# Patient Record
Sex: Female | Born: 1970 | Race: White | Hispanic: No | Marital: Married | Smoking: Former smoker
Health system: Southern US, Academic
[De-identification: ages and names within clinical notes are randomized; demographics above are authoritative.]

## PROBLEM LIST (undated history)

## (undated) DIAGNOSIS — F909 Attention-deficit hyperactivity disorder, unspecified type: Secondary | ICD-10-CM

## (undated) DIAGNOSIS — B009 Herpesviral infection, unspecified: Secondary | ICD-10-CM

## (undated) DIAGNOSIS — E039 Hypothyroidism, unspecified: Secondary | ICD-10-CM

## (undated) DIAGNOSIS — F32A Depression, unspecified: Secondary | ICD-10-CM

## (undated) DIAGNOSIS — C4491 Basal cell carcinoma of skin, unspecified: Secondary | ICD-10-CM

## (undated) DIAGNOSIS — F329 Major depressive disorder, single episode, unspecified: Secondary | ICD-10-CM

## (undated) DIAGNOSIS — F419 Anxiety disorder, unspecified: Secondary | ICD-10-CM

## (undated) HISTORY — PX: HX TONSILLECTOMY: SHX27

## (undated) HISTORY — DX: Anxiety disorder, unspecified: F41.9

## (undated) HISTORY — PX: TONSILLECTOMY: SUR1361

## (undated) HISTORY — DX: Major depressive disorder, single episode, unspecified: F32.9

## (undated) HISTORY — DX: Basal cell carcinoma of skin, unspecified: C44.91

## (undated) HISTORY — PX: MOHS SURGERY: SUR867

## (undated) HISTORY — DX: Depression, unspecified: F32.A

## (undated) HISTORY — DX: Attention-deficit hyperactivity disorder, unspecified type: F90.9

---

## 2011-01-07 ENCOUNTER — Ambulatory Visit (INDEPENDENT_AMBULATORY_CARE_PROVIDER_SITE_OTHER): Payer: Self-pay | Admitting: Internal Medicine

## 2011-01-21 ENCOUNTER — Ambulatory Visit: Payer: No Typology Code available for payment source | Attending: Internal Medicine | Admitting: Internal Medicine

## 2011-01-21 ENCOUNTER — Encounter (INDEPENDENT_AMBULATORY_CARE_PROVIDER_SITE_OTHER): Payer: Self-pay | Admitting: Internal Medicine

## 2011-01-21 DIAGNOSIS — F341 Dysthymic disorder: Secondary | ICD-10-CM | POA: Insufficient documentation

## 2011-01-21 DIAGNOSIS — Z0000 Encounter for general adult medical examination without abnormal findings: Secondary | ICD-10-CM | POA: Insufficient documentation
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
## 2011-01-22 ENCOUNTER — Telehealth (INDEPENDENT_AMBULATORY_CARE_PROVIDER_SITE_OTHER): Payer: Self-pay | Admitting: Internal Medicine

## 2011-01-22 MED ORDER — DULOXETINE 30 MG CAPSULE,DELAYED RELEASE
30.0000 mg | DELAYED_RELEASE_CAPSULE | Freq: Every day | ORAL | Status: DC
Start: 2011-01-22 — End: 2011-02-03

## 2011-01-22 NOTE — Telephone Encounter (Signed)
Message copied by Charisse Klinefelter on Thu Jan 22, 2011 11:53 AM  ------       Message from: Peter Garter       Created: Wed Jan 21, 2011  1:02 PM         Pt is sched with psych on 6.18.12. Pt will need refills on her meds. She also states that she needs clonapen.              Please contact pt at [phone]              Thanks       Red - RS

## 2011-02-03 ENCOUNTER — Encounter: Payer: Self-pay | Admitting: Internal Medicine

## 2011-02-03 ENCOUNTER — Other Ambulatory Visit (INDEPENDENT_AMBULATORY_CARE_PROVIDER_SITE_OTHER): Payer: Self-pay | Admitting: Internal Medicine

## 2011-02-03 MED ORDER — ZOLPIDEM 10 MG TABLET
10.0000 mg | ORAL_TABLET | Freq: Every evening | ORAL | Status: DC | PRN
Start: 2011-02-03 — End: 2011-06-30

## 2011-02-03 MED ORDER — KLONOPIN ORAL
1.0000 mg | Freq: Every day | ORAL | Status: DC | PRN
Start: 2011-02-03 — End: 2011-04-28

## 2011-02-03 MED ORDER — DULOXETINE 30 MG CAPSULE,DELAYED RELEASE
30.0000 mg | DELAYED_RELEASE_CAPSULE | Freq: Every day | ORAL | Status: DC
Start: 2011-02-03 — End: 2011-05-26

## 2011-02-03 NOTE — Telephone Encounter (Signed)
Message copied by Richrd Prime on Tue Feb 03, 2011  1:44 PM  ------       Message from: Venia Carbon       Created: Tue Feb 03, 2011 12:08 PM         >> Venia Carbon 02/03/2011 12:08 PM       Dr Arizona Constable does not accept pt's insurance. Please send rxs to:       [facility] - [place], [facility] [address]              Phone: [phone] Fax: [phone]       duloxetine (CYMBALTA DR) 30 mg Oral Capsule, Delayed   #30 WITH REFILLS       Zolpidem (AMBIEN) 10 mg Oral Tablet  #30       CLONAZEPAM (KLONOPIN ORAL) 1 MG

## 2011-02-04 NOTE — Telephone Encounter (Addendum)
Ambien and Klonopin phoned into pharmacy as directed.

## 2011-03-04 ENCOUNTER — Encounter (INDEPENDENT_AMBULATORY_CARE_PROVIDER_SITE_OTHER): Payer: No Typology Code available for payment source | Admitting: Internal Medicine

## 2011-03-11 ENCOUNTER — Ambulatory Visit (INDEPENDENT_AMBULATORY_CARE_PROVIDER_SITE_OTHER): Payer: No Typology Code available for payment source | Admitting: Internal Medicine

## 2011-03-16 ENCOUNTER — Ambulatory Visit (INDEPENDENT_AMBULATORY_CARE_PROVIDER_SITE_OTHER): Payer: Self-pay | Admitting: Internal Medicine

## 2011-03-16 NOTE — Telephone Encounter (Signed)
Message copied by Richrd Prime on Mon Mar 16, 2011  2:16 PM  ------       Message from: Gibson Ramp       Created: Mon Mar 16, 2011 11:20 AM         >> Gibson Ramp 03/16/2011 11:20 AM       Dr.Brahmamdam       The pt is requesting that you put an order in the system for labs. She also scheduled an appt to see you in July, it is a follow up from her appt with the psychiatrist. She wants to know if you need to see her after the appt? If not, she will cancel. Please let her know

## 2011-03-26 ENCOUNTER — Ambulatory Visit (HOSPITAL_COMMUNITY): Payer: No Typology Code available for payment source

## 2011-04-13 ENCOUNTER — Ambulatory Visit (HOSPITAL_COMMUNITY): Payer: No Typology Code available for payment source | Admitting: Psychiatry

## 2011-04-24 ENCOUNTER — Other Ambulatory Visit (INDEPENDENT_AMBULATORY_CARE_PROVIDER_SITE_OTHER): Payer: Self-pay | Admitting: Internal Medicine
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
## 2011-04-27 NOTE — Telephone Encounter (Signed)
i reviewed the chart. i have seen this patient once and in my recollection, and with her phone message, i am nervous to be calling in clonazepam. i did attempt to call her just now and i left a message for her. Cell phone and home phone are the same. i asked her to call back giving her our office number and told her we need to speak with her to make sure she is ok and we can help her in the right way.  Charisse Klinefelter, MD 04/27/2011, 3:18 PM

## 2011-04-28 ENCOUNTER — Telehealth (HOSPITAL_COMMUNITY): Payer: Self-pay

## 2011-04-28 ENCOUNTER — Encounter (INDEPENDENT_AMBULATORY_CARE_PROVIDER_SITE_OTHER): Payer: Self-pay | Admitting: Internal Medicine

## 2011-04-28 ENCOUNTER — Ambulatory Visit: Payer: No Typology Code available for payment source | Attending: Internal Medicine | Admitting: Internal Medicine

## 2011-04-28 DIAGNOSIS — F3289 Other specified depressive episodes: Secondary | ICD-10-CM | POA: Insufficient documentation

## 2011-04-28 DIAGNOSIS — F411 Generalized anxiety disorder: Secondary | ICD-10-CM | POA: Insufficient documentation

## 2011-04-28 MED ORDER — CLONAZEPAM 1 MG TABLET
1.00 mg | ORAL_TABLET | Freq: Two times a day (BID) | ORAL | Status: DC | PRN
Start: 2011-04-28 — End: 2011-06-30

## 2011-04-28 NOTE — Telephone Encounter (Signed)
Dr. Silvana Newness [facility] messaged Dr. Annie Paras stating this patient needed an psychiatric intake appointment ASAP.  Dr. Everardo Beals messaged case manager and forwarded message from Dr. Isabell Jarvis.  Case manager called patient and told her the earliest appointment available was 07/29/11 but that her name could be placed on a cancellation list and she would be called as soon as someone cancelled.  Patient did not wish to make the October appointment but asked to be placed on the cancellation list.  Case manager shared this information in a message to both Dr. Everardo Beals and Dr. Isabell Jarvis.  04/28/2011  Lindsay Henson Karin Golden Lekha Dancer, DISCHARGE PL
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
## 2011-05-05 ENCOUNTER — Ambulatory Visit (HOSPITAL_COMMUNITY): Payer: Self-pay

## 2011-05-05 NOTE — Telephone Encounter (Signed)
Patient is having surgery and cannot make appointment in adult intake clinic for 05/06/11, she asked to be put back on the cancellation list and called after two weeks for recovery.  Case manager put patient back on cancellation list with note not to contact patient until after 05/22/11.  05/05/2011  Lindsay Henson, DISCHARGE PL

## 2011-05-05 NOTE — Telephone Encounter (Signed)
Message copied by Creta Levin on Tue May 05, 2011 12:02 PM  ------       Message from: Barkley Bruns       Created: Tue May 05, 2011 11:42 AM         >> Barkley Bruns 05/05/2011 11:42 AM       Patient was worked in from a Chiropodist for 7.11.12 but had to cancel because she's having surgery that cannot be postponed. She will be incapacitated for 2 weeks; can she be put back on the cancellation list for after that time?

## 2011-05-06 ENCOUNTER — Ambulatory Visit (HOSPITAL_COMMUNITY): Payer: No Typology Code available for payment source

## 2011-05-26 ENCOUNTER — Ambulatory Visit (INDEPENDENT_AMBULATORY_CARE_PROVIDER_SITE_OTHER): Payer: No Typology Code available for payment source | Admitting: Child & Adolescent Psychiatry

## 2011-05-26 ENCOUNTER — Encounter (FREE_STANDING_LABORATORY_FACILITY)
Admit: 2011-05-26 | Discharge: 2011-05-26 | Disposition: A | Discharge status: Home / Self Care | Payer: No Typology Code available for payment source | Attending: Child & Adolescent Psychiatry | Admitting: Child & Adolescent Psychiatry

## 2011-05-26 DIAGNOSIS — F411 Generalized anxiety disorder: Secondary | ICD-10-CM | POA: Diagnosis present

## 2011-05-26 LAB — THYROXINE, TOTAL T4: THYROXINE, TOTAL T4: 6 ug/dL (ref 5.0–12.5)

## 2011-05-26 LAB — THYROID STIMULATING HORMONE WITH FREE T4 REFLEX: THYROID STIMULATING HORMONE WITH FREE T4 REFLEX: 2.077 u[IU]/mL (ref 0.300–5.900)

## 2011-05-26 LAB — VITAMIN B12: VITAMIN B12: 505 pg/mL (ref 180–914)

## 2011-05-26 LAB — TRIIODOTHYRONINE, TOTAL (TOTAL T3): TRIIODOTHYRONINE, TOTAL (TOTAL T3): 114 ng/dL (ref 80–190)

## 2011-05-26 MED ORDER — SERTRALINE 50 MG TABLET
ORAL_TABLET | ORAL | Status: AC
Start: 2011-05-26 — End: ?

## 2011-05-26 MED ORDER — DULOXETINE 20 MG CAPSULE,DELAYED RELEASE
20.0000 mg | DELAYED_RELEASE_CAPSULE | Freq: Every day | ORAL | Status: DC
Start: 2011-05-26 — End: 2011-06-08
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
## 2011-06-30 ENCOUNTER — Ambulatory Visit: Payer: No Typology Code available for payment source | Attending: Internal Medicine | Admitting: Internal Medicine

## 2011-06-30 DIAGNOSIS — M542 Cervicalgia: Secondary | ICD-10-CM | POA: Insufficient documentation

## 2011-06-30 DIAGNOSIS — R209 Unspecified disturbances of skin sensation: Secondary | ICD-10-CM | POA: Insufficient documentation
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
## 2011-07-02 ENCOUNTER — Encounter (HOSPITAL_COMMUNITY): Payer: No Typology Code available for payment source | Admitting: Child & Adolescent Psychiatry

## 2011-08-10 ENCOUNTER — Other Ambulatory Visit (INDEPENDENT_AMBULATORY_CARE_PROVIDER_SITE_OTHER): Payer: Self-pay | Admitting: Internal Medicine

## 2011-08-10 NOTE — Telephone Encounter (Signed)
[facility] faxed refill for Ambien 10 mg.  at bedtime.  This medication is not on the med. List.

## 2012-09-08 ENCOUNTER — Ambulatory Visit (INDEPENDENT_AMBULATORY_CARE_PROVIDER_SITE_OTHER): Payer: No Typology Code available for payment source | Admitting: PREVENTIVE MEDICINE-OCCUPATIONAL MEDICINE

## 2012-09-08 NOTE — Patient Instructions (Signed)
Vaccine Information Statement    Influenza (Flu) Vaccine (Inactivated): What you need to know  2013-14    Many Vaccine Information Statements are available in Spanish and other languages. See www.immunize.org/vis  Hojas de Informacián Sobre Vacunas están disponibles en Español y en muchos otros idiomas. Visite http://www.immunize.org/vis    1. Why get vaccinated?    Influenza (“flu”) is a contagious disease that spreads around the [place] every winter, usually between October and May.     Flu is caused by the influenza virus, and can be spread by coughing, sneezing, and close contact.     Anyone can get flu, but the risk of getting flu is highest among children. Symptoms come on suddenly and may last several days. They can include:  • fever/chills  • sore throat  • muscle aches  • fatigue  • cough  • headache   • runny or stuffy nose    Flu can make some people much sicker than others. These people include young children, people 65 and older, pregnant women, and people with certain health conditions - such as heart, lung or kidney disease, or a weakened immune system.  Flu vaccine is especially important for these people, and anyone in close contact with them.    Flu can also lead to pneumonia, and make existing medical conditions worse. It can cause diarrhea and seizures in children.     Each year thousands of people in the [place] die from flu, and many more are hospitalized.     Flu vaccine is the best protection we have from flu and its complications. Flu vaccine also helps prevent spreading flu from person to person.       2. Inactivated flu vaccine    There are two types of influenza vaccine:     You are getting an inactivated flu vaccine, which does not contain any live influenza virus. It is given by injection with a needle, and often called the “flu shot.”    A different, live, attenuated (weakened) influenza vaccine is sprayed into the nostrils. This vaccine is described in a separate Vaccine  Information Statement.    Flu vaccine is recommended every year. Children 6 months through 8 years of age should get two doses the first year they get vaccinated.    Flu viruses are always changing. Each year’s flu vaccine is made to protect from viruses that are most likely to cause disease that year. While flu vaccine cannot prevent all cases of flu, it is our best defense against the disease. Inactivated flu vaccine protects against 3 or 4 different influenza viruses.    It takes about 2 weeks for protection to develop after the vaccination, and protection lasts several months to a year.     Some illnesses that are not caused by influenza virus are often mistaken for flu. Flu vaccine will not prevent these illnesses. It can only prevent influenza.    A “high-dose” flu vaccine is available for people 65 years of age and older. The person giving you the vaccine can tell you more about it.    Some inactivated flu vaccine contains a very small amount of a mercury-based preservative called thimerosal. Studies have shown that thimerosal in vaccines is not harmful, but flu vaccines that do not contain a preservative are available.      3. Some people should not get this vaccine    Tell the person who gives you the vaccine:  • If you have any severe (  life-threatening) allergies.  If you ever had a life-threatening allergic reaction after a dose of flu vaccine, or have a severe allergy to any part of this vaccine, you may be advised not to get a dose.  Most, but not all, types of flu vaccine contain a small amount of egg.     • If you ever had Guillain-Barré Syndrome (a severe paralyzing illness, also called GBS). Some people with a history of GBS should not get this vaccine. This should be discussed with your doctor.  • If you are not feeling well.  They might suggest waiting until you feel better.  But you should come back.      4. Risks of a vaccine reaction    With a vaccine, like any medicine, there is a chance of  side effects. These are usually mild and go away on their own.     Serious side effects are also possible, but are very rare. Inactivated flu vaccine does not contain live flu virus, so getting flu from this vaccine is not possible.     Brief fainting spells and related symptoms (such as jerking movements) can happen after any medical procedure, including vaccination. Sitting or lying down for about 15 minutes after a vaccination can help prevent fainting and injuries caused by falls. Tell your doctor if you feel dizzy or light-headed, or have vision changes or ringing in the ears.    Mild problems following inactivated flu vaccine:   • soreness, redness, or swelling where the shot was given    • hoarseness; sore, red or itchy eyes; cough  • fever  • aches  • headache  • itching  • fatigue  If these problems occur, they usually begin soon after the shot and last 1 or 2 days.     Moderate problems following inactivated flu vaccine:  • Young children who get inactivated flu vaccine and pneumococcal vaccine (PCV13) at the same time may be at increased risk for seizures caused by fever. Ask your doctor for more information. Tell your doctor if a child who is getting flu vaccine has ever had a seizure.    Severe problems following inactivated flu vaccine:  • A severe allergic reaction could occur after any vaccine (estimated less than 1 in a million doses).   • There is a small possibility that inactivated flu vaccine could be associated with Guillain-Barré Syndrome (GBS), no more than 1 or 2 cases per million people vaccinated. This is much lower than the risk of severe complications from flu, which can be prevented by flu vaccine.     The safety of vaccines is always being monitored.  For more information, visit: www.cdc.gov/vaccinesafety/       5. What if there is a serious reaction?    What should I look for?    • Look for anything that concerns you, such as signs of a severe allergic reaction, very high fever, or  behavior changes.    Signs of a severe allergic reaction can include hives, swelling of the face and throat, difficulty breathing, a fast heartbeat, dizziness, and weakness. These would start a few minutes to a few hours after the vaccination.    What should I do?    • If you think it is a severe allergic reaction or other emergency that can’t wait, call 9-1-1 or get the person to the nearest hospital. Otherwise, call your doctor.  • Afterward, the reaction should be reported to the Vaccine Adverse Event   Reporting System (VAERS). Your doctor might file this report, or you can do it yourself through the VAERS web site at www.vaers.hhs.gov, or by calling 1-800-822-7967.    VAERS is only for reporting reactions. They do not give medical advice.      7. The National Vaccine Injury Compensation Program    The National Vaccine Injury Compensation Program (VICP) is a federal program that was created to compensate people who may have been injured by certain vaccines.    Persons who believe they may have been injured by a vaccine can learn about the program and about filing a claim by calling 1-800-338-2382 or visiting the VICP website at www.hrsa.gov/vaccinecompensation.      8. How can I learn more?  • Ask your doctor.  • Call your local or state health department.  • Contact the Centers for Disease Control and   Prevention (CDC):  - Call 1-800-232-4636 (1-800-CDC-INFO) or  - Visit CDC’s website at www.cdc.gov/flu      Vaccine Information Statement (Interim)  Inactivated Influenza Vaccine   (05/20/2012)   42 U.S.C. § 300aa-26    Department of Health and Human Services  Centers for Disease Control and Prevention    Office Use Only

## 2012-09-08 NOTE — Progress Notes (Signed)
Influenza vaccine given.  Lindsay Henson 09/08/2012, 1:37 PM

## 2013-06-30 ENCOUNTER — Ambulatory Visit (HOSPITAL_COMMUNITY): Payer: Self-pay

## 2013-06-30 NOTE — Telephone Encounter (Signed)
Patient has Epic orders for an intake appointment at [facility]. There are currently no intake appointments available due to current patient capacity. Case manager put patient on cancellation list and sent patient a letter explaining wait times for a new patient appointment at [facility].   06/30/2013

## 2013-06-30 NOTE — Telephone Encounter (Signed)
Message copied by Delorise Hunkele, [place] L on Fri Jun 30, 2013  4:17 PM  ------       Message from: Clemencia Course       Created: Thu Jun 08, 2013  1:42 PM         >> Clemencia Course 06/08/2013 01:42 PM       Rebekah/new Pt Inquiry--pt last seen [id number] Dr Nee--Dr Marnette Burgess is referring this pt who is currently pregnant and experiencing issues wth anxiety and depression. Pt had previously been taking Clonopin, Ambien, Adderall and Zoloft 200mg . Pt is now only taking the Zoloft at 10 mg and no other medications with worsening of her symptoms. They ask for her ot be seen ASAP but I advised of our current scheduling status. Caller will make referring provider aware of this and will cal [place] back if they decide to make an alternate referral. We can contact pt directly for scheduling at [phone] (ph confirmed) thanks!  ------

## 2013-08-20 ENCOUNTER — Other Ambulatory Visit: Payer: Self-pay

## 2013-10-10 ENCOUNTER — Ambulatory Visit (INDEPENDENT_AMBULATORY_CARE_PROVIDER_SITE_OTHER): Payer: Self-pay

## 2013-10-10 NOTE — Telephone Encounter (Signed)
Left message for pt to return call to clinic.

## 2013-10-10 NOTE — Telephone Encounter (Signed)
Message copied by Zada Finders on Tue Oct 10, 2013  1:48 PM  ------       Message from: RICE, PATRICIA       Created: Mon Oct 09, 2013  1:45 PM       Regarding: placenta removal from hospital         >> PATRICIA RICE 10/09/2013 01:45 PM       Placenta removal from [facility] called and would like to talk to someone about delivering her baby here in January and then having a service provider come in a remove her placenta from the hospital after delivery of the baby.  Please advise her at [phone]. Thank you  ------

## 2013-10-17 NOTE — Telephone Encounter (Signed)
Pt never returned call.

## 2014-06-06 ENCOUNTER — Telehealth (HOSPITAL_COMMUNITY): Payer: Self-pay

## 2014-06-06 NOTE — Telephone Encounter (Signed)
Patient has been on New Patient waiting list for psychiatry and there are now appointments available for scheduling.  Case manager called patient to offer an appointment but patient did not answer.  Case manager left message asking patient to call back if still interested in scheduling.  06/06/2014  Lindsay Henson Lorraine Gedalia Mcmillon, DISCHARGE [address]

## 2014-08-02 ENCOUNTER — Other Ambulatory Visit: Payer: Self-pay

## 2014-08-22 ENCOUNTER — Ambulatory Visit (INDEPENDENT_AMBULATORY_CARE_PROVIDER_SITE_OTHER): Payer: No Typology Code available for payment source

## 2014-08-22 VITALS — Temp 98.9°F

## 2014-08-22 DIAGNOSIS — Z23 Encounter for immunization: Secondary | ICD-10-CM

## 2014-08-22 NOTE — Patient Instructions (Signed)
2015-2016 Vaccine Information Statement    Influenza (Flu) Vaccine (Inactivated or Recombinant): What you need to know    Many Vaccine Information Statements are available in Spanish and other languages. See www.immunize.org/vis  Hojas de Información Sobre Vacunas están disponibles en Español y en muchos otros idiomas. Visite www.immunize.org/vis    1. Why get vaccinated?    Influenza ("flu") is a contagious disease that spreads around the [place] every year, usually between October and May.     Flu is caused by influenza viruses, and is spread mainly by coughing, sneezing, and close contact.     Anyone can get flu. Flu strikes suddenly and can last several days. Symptoms vary by age, but can include:  o fever/chills  o sore throat  o muscle aches  o fatigue  o cough  o headache   o runny or stuffy nose    Flu can also lead to pneumonia and blood infections, and cause diarrhea and seizures in children.  If you have a medical condition, such as heart or lung disease, flu can make it worse.    Flu is more dangerous for some people. Infants and young children, people 65 years of age and older, pregnant women, and people with certain health conditions or a weakened immune system are at greatest risk.      Each year thousands of people in the [place] die from flu, and many more are hospitalized.     Flu vaccine can:  o keep you from getting flu,  o make flu less severe if you do get it, and  o keep you from spreading flu to your family and other people.     2. Inactivated and recombinant flu vaccines    A dose of flu vaccine is recommended every flu season. Children 6 months through 8 years of age may need two doses during the same flu season.  Everyone else needs only one dose each flu season.       Some inactivated flu vaccines contain a very small amount of a mercury-based preservative called thimerosal. Studies have not shown thimerosal in vaccines to be harmful, but flu vaccines that do not contain  thimerosal are available.    There is no live flu virus in flu shots.  They cannot cause the flu.     There are many flu viruses, and they are always changing. Each year a new flu vaccine is made to protect against three or four viruses that are likely to cause disease in the upcoming flu season. But even when the vaccine doesn't exactly match these viruses, it may still provide some protection    Flu vaccine cannot prevent:  o flu that is caused by a virus not covered by the vaccine, or  o illnesses that look like flu but are not.    It takes about 2 weeks for protection to develop after vaccination, and protection lasts through the flu season.     3. Some people should not get this vaccine    Tell the person who is giving you the vaccine:    o If you have any severe, life-threatening allergies.    If you ever had a life-threatening allergic reaction after a dose of flu vaccine, or have a severe allergy to any part of this vaccine, you may be advised not to get vaccinated.  Most, but not all, types of flu vaccine contain a small amount of egg protein.       o If   you ever had Guillain-Barré Syndrome (also called GBS).   Some people with a history of GBS should not get this vaccine. This should be discussed with your doctor.    o If you are not feeling well.    It is usually okay to get flu vaccine when you have a mild illness, but you might be asked to come back when you feel better.      4. Risks of a vaccine reaction     With any medicine, including vaccines, there is a chance of reactions. These are usually mild and go away on their own, but serious reactions are also possible.     Most people who get a flu shot do not have any problems with it.     Minor problems following a flu shot include:   o soreness, redness, or swelling where the shot was given    o hoarseness  o sore, red or itchy eyes  o cough  o fever  o aches  o headache  o itching  o fatigue  If these problems occur, they usually begin soon after the  shot and last 1 or 2 days.     More serious problems following a flu shot can include the following:    o There may be a small increased risk of Guillain-Barré Syndrome (GBS) after inactivated flu vaccine.  This risk has been estimated at 1 or 2 additional cases per million people vaccinated. This is much lower than the risk of severe complications from flu, which can be prevented by flu vaccine.      o Young children who get the flu shot along with pneumococcal vaccine (PCV13) and/or DTaP vaccine at the same time might be slightly more likely to have a seizure caused by fever. Ask your doctor for more information. Tell your doctor if a child who is getting flu vaccine has ever had a seizure.     Problems that could happen after any injected vaccine:     o People sometimes faint after a medical procedure, including vaccination. Sitting or lying down for about 15 minutes can help prevent fainting, and injuries caused by a fall. Tell your doctor if you feel dizzy, or have vision changes or ringing in the ears.    o Some people get severe pain in the shoulder and have difficulty moving the arm where a shot was given. This happens very rarely.    o Any medication can cause a severe allergic reaction. Such reactions from a vaccine are very rare, estimated at about 1 in a million doses, and would happen within a few minutes to a few hours after the vaccination.    As with any medicine, there is a very remote chance of a vaccine causing a serious injury or death.    The safety of vaccines is always being monitored. For more information, visit: www.cdc.gov/vaccinesafety/    5. What if there is a serious reaction?    What should I look for?    o Look for anything that concerns you, such as signs of a severe allergic reaction, very high fever, or unusual behavior.    Signs of a severe allergic reaction can include hives, swelling of the face and throat, difficulty breathing, a fast heartbeat, dizziness, and weakness - usually  within a few minutes to a few hours after the vaccination.    What should I do?    o If you think it is a severe allergic reaction or other   emergency that can't wait, call 9-1-1 and get the person to the nearest hospital. Otherwise, call your doctor.    o Reactions should be reported to the Vaccine Adverse Event Reporting System (VAERS). Your doctor should file this report, or you can do it yourself through  the VAERS web site at www.vaers.hhs.gov, or by calling 1-800-822-7967.    VAERS does not give medical advice.    6. The National Vaccine Injury Compensation Program    The National Vaccine Injury Compensation Program (VICP) is a federal program that was created to compensate people who may have been injured by certain vaccines.    Persons who believe they may have been injured by a vaccine can learn about the program and about filing a claim by calling 1-800-338-2382 or visiting the VICP website at www.hrsa.gov/vaccinecompensation.  There is a time limit to file a claim for compensation.    7. How can I learn more?  o Ask your healthcare provider. He or she can give you the vaccine package insert or suggest other sources of information.  o Call your local or state health department.  o Contact the Centers for Disease Control and Prevention (CDC):  - Call 1-800-232-4636 (1-800-CDC-INFO) or  - Visit CDC's website at www.cdc.gov/flu    Vaccine Information Statement   Inactivated Influenza Vaccine   06/01/2014  42 U.S.C. § 300aa-26    Department of Health and Human Services  Centers for Disease Control and Prevention    Office Use Only

## 2014-08-22 NOTE — Progress Notes (Addendum)
1. Are you 41 years of age or older? yes  3. Have you ever had a severe reaction to a flu shot? no  4. Are you allergic to eggs? no  5. Are you allergic to latex? no  6. Are you allergic to Thimerosol? no  7. Are you experiencing acute illness symptoms or have you been running a fever? no  8. Do you have a medical condition or taking medications that suppress your immune system? no  9. Have you ever had Guillain-Barre syndrome or other neurologic disorder? No   10. Are you pregnant or breastfeeding? yes      Immunization administered     Name Date Dose VIS Date Route    INFLUENZA VACCINE IM 08/22/2014 0.5 mL 06/01/2014 Intramuscular    Site: Left deltoid    Given By: Mariel Sleetuppett, Kayla M, RN    Manufacturer: [employer]    Lot: [id number]          [facility] [employer] [facility].    Macon LargeKelly J Loreli Debruler 08/23/2014, 13:55

## 2014-10-02 ENCOUNTER — Other Ambulatory Visit (HOSPITAL_COMMUNITY): Payer: Self-pay

## 2014-10-02 DIAGNOSIS — F323 Major depressive disorder, single episode, severe with psychotic features: Secondary | ICD-10-CM

## 2014-12-19 ENCOUNTER — Ambulatory Visit
Admission: RE | Admit: 2014-12-19 | Discharge: 2014-12-19 | Disposition: A | Discharge status: Home / Self Care | Payer: No Typology Code available for payment source | Source: Ambulatory Visit | Attending: Clinical Neuropsychologist | Admitting: Clinical Neuropsychologist

## 2014-12-19 DIAGNOSIS — F909 Attention-deficit hyperactivity disorder, unspecified type: Secondary | ICD-10-CM

## 2014-12-19 DIAGNOSIS — F39 Unspecified mood [affective] disorder: Secondary | ICD-10-CM | POA: Insufficient documentation

## 2014-12-19 DIAGNOSIS — R413 Other amnesia: Secondary | ICD-10-CM

## 2014-12-19 DIAGNOSIS — F09 Unspecified mental disorder due to known physiological condition: Secondary | ICD-10-CM | POA: Insufficient documentation

## 2014-12-19 DIAGNOSIS — F323 Major depressive disorder, single episode, severe with psychotic features: Secondary | ICD-10-CM

## 2014-12-31 NOTE — Behavioral Health (Signed)
[facility]  [place] Department of Behavioral Medicine and Psychiatry  Outpatient Services  [address]  [place], [place] [address]    PATIENT NAME: Lindsay Henson, Lindsay Henson  CHART NUMBER: [id number]  DATE OF BIRTH: [birth year]  DATE OF SERVICE: 17-Jan-2015  BILLING CODES:  [phone] = 4 hours (INTERVIEW: 9:20/10:00 a.m. and 12:15/1:00 p.m.) My responsibilities also included evaluation direction, clinical interview, motor/sensory informals, medical record reviews, neuropsychological and MMPI results interpretation and integration and report preparation/dictation/editing = +2.5 hours.); [id number] = 3 hours (Technician:  JF 8:25/9:20 a.m.; 10:00/11:42 a.m)  TIME IN/OUT:  8:25 am/1:30 pm    29 December 2014    Boykin Peek, MD   [facility] [place]  [address]   [place], [place]  [address]    Dear Doctor:    The above-named outpatient was seen at your request for neuropsychological evaluation as part of your ongoing psychiatric care.  This referred outpatient is a 44 year old, right-handed, married, Caucasian woman with 14 years of formal education who is presently a homemaker.   Presently, she describes memory difficulties such as frequently misplacing items, walking into a room and forgetting what she went for.  With specific questioning, she also indicates that she was previously treated for attention deficit disorder and has been prescribed Adderall medication,  She indicates that the diagnosis was made 15 years ago based on her responses to a structured questionnaire, although she did not undergo formal evaluation..  She does not report evaluation or diagnosis of learning difficulties, but reported that she needed to spend more time in order to learn math concepts.  She was in gifted classes for [place] and writing.  This individual completed two years of college, but reports that her class attendance declined to the point that she received poor grades and eventually stopped her college education.  She  subsequently attended beauty school and has worked as a Social worker in the past.  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      It is my understanding that this individual is presently followed for psychiatric care and supportive counseling at [facility].  She describes significant and unstable depressed mood with feelings of paranoia and guilt.  She states that she feels uncertain all the time.  She voices concern also about her patience with her small children.  She denies current intention for harm, but does reports passive suicidal ideation as recently as one week ago.  Current psychiatric diagnoses include major depressive disorder and anxiety.   She feels that she is safe and not in any jeopardy at the present time but describes paralyzing fear most of the time, having recurrent episodes in which she cannot get out of the car and has been afraid to let her children play outside or go outside.  She indicates she believes she does better emotionally when she is working outside her home.  She describes a dislike of her present living location.  She also endorses perceiving other people's faces, hearing her baby crying when she is not there as well as seeing deceased individuals.  She reports poor recollection of her childhood.  She states that she feels something is "really wrong" over the past year, but was unable to articulate the nature of these difficulties.  She describes pain in her stomach and head, rating her pain as a '7' on a scale of 1-10(worst).   This individual endorses experiencing unclear traumas during adulthood when specifically questioned. She describes  longstanding issues with mood, apparently exacerbated during her pregnancies, as she has experienced postpartum depression during all three of her pregnancies.  Reportedly, this individual has received fairly consistent psychiatric treatment with periodic supportive counseling since age 44.  She did  not report previous psychiatric hospitalization or episodes of suicidal action or harm to self or others.    In regards to neurologic history, this outpatient reports that she accidentally runs into things frequently, describing possible problems with her vision or depth perception.  In 2013, she ran into the corner of a door and was referred to a neurologist.  She denies significant problems identified during doctors' evaluation or brain imaging.  She experiences migraine headaches daily for the past five years.  She is not on preventive medication and does not experience an aura.  She indicates that in 2006, a wall fell on her.  She needed suturing to her head and was diagnosed with a concussion.  She had a few seconds of loss of consciousness.  Childhood illnesses include recurrent sore and strep throat and sun poisoning.  She does not report significant difficulties during her mother's pregnancy or developmental delays.  She did not receive speech, physical or occupational therapies previously, according to her report.  She is not presently treated for other medical conditions. Current prescriptions include Risperdal, Klonopin and Zoloft.  She does not report use of herbal preparations or over-the-counter medications.  She is a previous smoker on the order of 1 pack a day.  She is a previous user of THC in college and before having her children.  She reports significant beer intake during college and drinking 3-4 glasses of wine per week between the births of her first and second children.  She denies current alcohol intake or use of nicotine, illicit drugs or overuse of prescription medications.  Family history includes grandfather with thought disorder who she believes was treated with ECT.    From a psychosocial standpoint, this individual resides with her spouse and three young children ages 63, 84, and 1 in their own home in [place], [place].  This couple transferred here from [place] for her  spouse to attend graduate school.  Her spouse is now completing his training and the couple plans to move on this year.  She voices dislike of the terrain and weather patterns in this region but describes strong support from friends in this community and her local church.  She completed some 14 years of formal education receiving average grades.  Vocational history includes work as a Social worker until four years ago.    In terms of behavioral observations, this woman, who was dressed neatly and casually with grey-green eyes, arrived promptly for her scheduled appointment and was escorted to our offices.  From the outset, she was very tearful and voiced significant concern about her present status.  My interviews were prolonged.  She is also tearful throughout the evaluation in spite of entering willingly and displaying reasonable effort with all test measures presented. Progress was slow across the test battery.  She was noted to use verbal self-encouragement.  There were no indications of unusual motor, language, cooperation or motivation difficulties.  She appeared to monitor her performance closely.  Her distress hampered social interactions but by the end of this evaluation, she was better composed. This individual was noted to repeatedly stretch.  She describes soreness from yoga exercise.  I do not consider this outpatient's current psychiatric status to be fully stabilized and therefore, these  results are an estimate of her current neurocognitive status.    A variety of motor, sensory and visual constructional screening tasks were given.  Motorically, she is right-hand dominant.  There was no apparent difficulty with motoric screening tasks such as repetitive motor loops, hand praxis, hand position, finger oscillation, finger series, alternating hand movements, hand steadiness, finger-to-nose, motor response.  Written alternating series was perseverative, as was 3-step series bilaterally.  Handwriting speed  was rapid relative to age-matched peers.  Eye/hand coordination was high average and with greater than the expected dominant hand advantage.  In regards to sensory capabilities, there was no obvious difficulty with auditory or visual perception.  Line bisection shows slight bilateral inattention.  No difficulty with double simultaneous tactile or directional stimulation and she was able to match simple geometric designs.  Judgment of line orientation was superior relative to age-matched peers.  In regards to visual constructions, she could spontaneously draw a clock face with the time set as directed.  Her spontaneous drawing of the requested item omits a prominent detail, but is otherwise neat and well formed.  Her drawings of the simple geometric designs were neat and well formed.  Complex figure drawing shows segmentation and reduced precision.  Three-dimensional constructions were average relative to age-matched peers.    In regards to speech and language, this woman's voice had normal volume, tone, production and prosody with intact thought form and thought content.  She could write spontaneously and to dictation, and her handwriting is reasonably neat and well formed.  Single word reading measured above the 12th grade level commensurate with reported educational background.  Confrontation naming and letter word list generation were well within average limits.  In contrast, semantic fluency was just below average limits.    This individual's pattern of performance on selected subtests of psychometric intelligence was consistently in the average range relative to age-matched peers.  Series connection was rapid and while her progress declined slightly on series alternation, she performed well within expectation without inattentive errors on either trial.  She displayed good ability to inhibit an automatic response and maintained her progress on a second practice trial.  Verbal problem solving was high average  relative to age-matched peers, although with some inconsistency in performance across this subtests.  Symbol substitution speed was also average.  When structure and verbal feedback were provided, she was able to rapidly and accurately generate appropriate solutions showing reasonable creativity and flexibility.    Measures of concentration and memory were also given.  This individual was alert and fully oriented to person, place, situation and date, and able to report recent news headlines accurately.  Verbal digit span measured as 6 digits forward and 5 digits backward and consistent.  No failure to maintain set on complex problem solving, though interference effects are observed with competing word lists.  Further on the challenging consonant trigrams measure, she displayed reduced retention of the triads with any sort of delay interval and had multiple perseverative and sequencing errors.  In regards to verbal memory, on the list learning task, she displayed a curtailed learning curve for retaining at best 9 of 16 targets despite 5 practice trials with reliance upon serial clustering, multiple repetitions and intrusions  (pattern:  5, 6, 9, 6, 9).  Initially, she provided the targets she had learned over the short delay interval, but her long delay free recall was reduced.  She had a yea-saying response set on the multiple choice recognition, although forced choice performance was reduced.  When verbal information was organized into story passages, she provided the gist of each story immediately after presentation and retained the majority (72%) over the delay interval, although she did not benefit further from multiple choice recognition.  In terms of visual retention, incidental symbol recall was accurate, but her incidental number-symbol association was reduced.  Her retention of the simple geometric designs was reasonably neat and well formed immediately after presentation, though she provided only about  half of this material (58%) over the delay interval, though she rebounded with semantic cues.  Multiple choice recognitions did not assist her but a forced choice format was effective.  For the complex geometric designs, she did not provide the overall gestalt, but was able to provide several internal designs immediately after presentation and retained most of what she recalled over the delay interval and was able to recognize this figure from multiple foils.  Overall, this pattern indicates marked sensitivity to interference and reduced verbal and visual memory retention, though her retrieval is enhanced by structure and recognition aids.     The reader is provided with my summary of her known mental health history and numerous behavioral observations in the background section of this report. This individual endorsed many items reflective of depression and especially anxiety on our brief mood rating screens (BAI = 23; BDI = 44).  She also independently completed the [facility] Inventory,2 RF as part of this evaluation.  Her profile is valid and interpretable, although she endorsed multiple infrequent responses with specifically infrequent somatic issues.  Her profile suggests thought dysfunction with significant somatic complaints and demoralization with average experiences enforced somatically.  She describes gastrointestinal, head, neurologic and cognitive complaints with significant anxiety and externalizing activation certainly suspicious of an underlying schizoaffective contribution.  Given her intellect and distress, I would certainly consider adjunctive individual and/or group cognitive behavioral and/or dialectal behavioral therapies.    In summary, this referred outpatient is a 44 year old, right-handed, married, Caucasian woman with 14 years of formal education who is presently a homemaker was seen for neurocognitive and mood evaluation.   Presently, she describes memory  difficulties and also indicates that she was previously with attention deficit disorder and has been prescribed Adderall medication.  She describes longstanding issues with mood, apparently exacerbated during her pregnancies, as she has experienced postpartum depression during all three of her pregnancies. Current psychiatric diagnoses include major depressive disorder and anxiety.    She describes pain in her stomach and head. This individual endorses experiencing unclear traumas during adulthood when specifically questioned.     Throughout these interviews and evaluation, this woman was tearful and distressed and I do not consider this outpatient's current psychiatric status to be fully stabilized.  As a result, these findings likely underestimate her optimal capabilities.  Nevertheless, I feel I can say with reasonable confidence that she is bright and of average intellect with no evident difficulties with motor, language, visuconstructions, language, simple attention or problem-solving capabilities.  Greater difficulty is noted with complex attention,  and memory measures.  She displays marked sensitivity to interference and reduced verbal and visual memory retention, though her retrieval is enhanced by structure and recognition aids. I am suspicious that she does have a concomitant adult residual attention deficit disorder as well as mood and post-traumatic stress issue.  Her mood and affect are also perseverative and I believe she would benefit from more psychiatric monitoring. On the other hand, I am well aware that this outpatient has multiple responsibilities, but she  would benefit from increased support and opportunities for regular social and creative outlets. I am doubtful that this woman's mood can be fully stabilized while she is residing in this region as there appear to be multiple associated personality, pain and other issues which I could not explore in the context of this assessment evaluation.  I  suspect that she has childhood or preexisting trauma which also factors in to her current mood and fucntioning.   Careful planning is urged for follow-up care, especially in light of her mood instability and upcoming plans to transfer from this area within the next 3-6 months.    Thank you for this referral.  Please do not hesitate to contact me should you wish to discuss these findings further.  If desired, I would be willing to meet with her to provide brief feedback about these results as we had established good rapport, though my schedule is presently very limited in this department for these sorts of arrangements.    Sincerely yours,      Burgess Estelle, PhD  Associate Professor/Clinical Neuropsychologist  [place] Department of Behavioral Medicine    Examiners: JF and CSW    [id number]; D: 12/29/2014 12:22:18; T: 12/29/2014 15:03:58    cc:  Boykin Peek MD   [facility]       [address]       [place], [place] [address]

## 2015-01-28 ENCOUNTER — Ambulatory Visit
Admission: RE | Admit: 2015-01-28 | Discharge: 2015-01-28 | Disposition: A | Discharge status: Home / Self Care | Payer: No Typology Code available for payment source | Source: Ambulatory Visit | Attending: Psychiatry | Admitting: Psychiatry

## 2015-01-28 DIAGNOSIS — F3181 Bipolar II disorder: Secondary | ICD-10-CM | POA: Insufficient documentation

## 2015-01-28 LAB — COMPREHENSIVE METABOLIC PANEL, NON-FASTING
ALBUMIN: 4.5 g/dL (ref 3.5–5.0)
ALKALINE PHOSPHATASE: 75 U/L (ref <150)
ALT (SGPT): 14 U/L (ref <55)
ANION GAP: 8 mmol/L (ref 4–13)
AST (SGOT): 16 U/L (ref 8–41)
BILIRUBIN, TOTAL: 1.3 mg/dL (ref 0.3–1.3)
BUN/CREAT RATIO: 14 (ref 6–22)
BUN: 11 mg/dL (ref 8–25)
CALCIUM: 10.4 mg/dL (ref 8.5–10.4)
CARBON DIOXIDE: 25 mmol/L (ref 22–32)
CHLORIDE: 104 mmol/L (ref 96–111)
CREATININE: 0.8 mg/dL (ref 0.49–1.10)
ESTIMATED GLOMERULAR FILTRATION RATE: >59 mL/min/{1.73_m2} (ref >59)
GLUCOSE,NONFAST: 51 mg/dL — ABNORMAL LOW (ref 65–139)
POTASSIUM: 3.7 mmol/L (ref 3.5–5.1)
SODIUM: 137 mmol/L (ref 136–145)
TOTAL PROTEIN: 7 g/dL (ref 6.4–8.3)

## 2015-01-28 LAB — CBC
HCT: 42.7 % (ref 33.5–45.2)
HGB: 14.6 g/dL (ref 11.2–15.2)
MCH: 32.1 pg (ref 27.4–33.0)
MCHC: 34.2 g/dL (ref 32.5–35.8)
MCV: 93.9 fL (ref 78–100)
MPV: 8.3 fL (ref 7.5–11.5)
PLATELET COUNT: 238 10*3/uL (ref 140–450)
RBC: 4.54 MIL/uL (ref 3.63–4.92)
RDW: 13 % (ref 12.0–15.0)
WBC: 4.9 THOU/uL (ref 3.5–11.0)

## 2015-01-28 LAB — LIPID PANEL
CHOLESTEROL: 134 mg/dL (ref <200)
HDL-CHOLESTEROL: 44 mg/dL — ABNORMAL LOW (ref >49)
LDL (CALCULATED): 77 mg/dL (ref <100)
NON - HDL (CALCULATED): 90 mg/dL (ref <190)
TRIGLYCERIDES: 63 mg/dL (ref <150)
VLDL (CALCULATED): 13 mg/dL (ref <30)

## 2015-01-28 LAB — THYROID STIMULATING HORMONE WITH FREE T4 REFLEX: THYROID STIMULATING HORMONE WITH FREE T4 REFLEX: 2.001 u[IU]/mL (ref 0.350–5.000)

## 2015-09-22 ENCOUNTER — Other Ambulatory Visit: Payer: Self-pay

## 2016-07-26 ENCOUNTER — Other Ambulatory Visit: Payer: Self-pay

## 2016-11-26 ENCOUNTER — Other Ambulatory Visit: Payer: Self-pay | Admitting: Family Medicine

## 2016-11-26 DIAGNOSIS — Z1231 Encounter for screening mammogram for malignant neoplasm of breast: Secondary | ICD-10-CM

## 2016-12-03 ENCOUNTER — Ambulatory Visit: Payer: Self-pay

## 2016-12-21 ENCOUNTER — Other Ambulatory Visit: Payer: Self-pay | Admitting: Family Medicine

## 2016-12-21 ENCOUNTER — Ambulatory Visit
Admission: RE | Admit: 2016-12-21 | Discharge: 2016-12-21 | Disposition: A | Discharge status: Home / Self Care | Payer: BC Managed Care – PPO | Source: Ambulatory Visit | Attending: Family Medicine | Admitting: Family Medicine

## 2016-12-21 DIAGNOSIS — Z1231 Encounter for screening mammogram for malignant neoplasm of breast: Secondary | ICD-10-CM

## 2017-05-08 ENCOUNTER — Emergency Department: Payer: BC Managed Care – PPO

## 2017-05-08 ENCOUNTER — Encounter: Payer: Self-pay | Admitting: Emergency Medicine

## 2017-05-08 ENCOUNTER — Emergency Department
Admission: EM | Admit: 2017-05-08 | Discharge: 2017-05-08 | Disposition: A | Discharge status: Home / Self Care | Payer: BC Managed Care – PPO | Attending: Emergency Medicine | Admitting: Emergency Medicine

## 2017-05-08 DIAGNOSIS — Z87891 Personal history of nicotine dependence: Secondary | ICD-10-CM | POA: Insufficient documentation

## 2017-05-08 DIAGNOSIS — B029 Zoster without complications: Secondary | ICD-10-CM | POA: Diagnosis not present

## 2017-05-08 DIAGNOSIS — R21 Rash and other nonspecific skin eruption: Secondary | ICD-10-CM | POA: Diagnosis present

## 2017-05-08 LAB — CBC WITH DIFFERENTIAL/PLATELET
Basophils Absolute: 0.1 10*3/uL (ref 0–0.1)
Basophils Relative: 1 %
Eosinophils Absolute: 0.2 10*3/uL (ref 0–0.7)
Eosinophils Relative: 3 %
HCT: 37.5 % (ref 35.0–47.0)
Hemoglobin: 12.6 g/dL (ref 12.0–16.0)
Lymphocytes Relative: 24 %
Lymphs Abs: 1.8 10*3/uL (ref 1.0–3.6)
MCH: 29.8 pg (ref 26.0–34.0)
MCHC: 33.7 g/dL (ref 32.0–36.0)
MCV: 88.3 fL (ref 80.0–100.0)
Monocytes Absolute: 0.5 10*3/uL (ref 0.2–0.9)
Monocytes Relative: 6 %
Neutro Abs: 5.2 10*3/uL (ref 1.4–6.5)
Neutrophils Relative %: 66 %
Platelets: 257 10*3/uL (ref 150–440)
RBC: 4.24 MIL/uL (ref 3.80–5.20)
RDW: 15.2 % — ABNORMAL HIGH (ref 11.5–14.5)
WBC: 7.8 10*3/uL (ref 3.6–11.0)

## 2017-05-08 LAB — COMPREHENSIVE METABOLIC PANEL
ALT: 13 U/L — ABNORMAL LOW (ref 14–54)
AST: 20 U/L (ref 15–41)
Albumin: 4.4 g/dL (ref 3.5–5.0)
Alkaline Phosphatase: 79 U/L (ref 38–126)
Anion gap: 7 (ref 5–15)
BUN: 13 mg/dL (ref 6–20)
CO2: 27 mmol/L (ref 22–32)
Calcium: 9.5 mg/dL (ref 8.9–10.3)
Chloride: 104 mmol/L (ref 101–111)
Creatinine, Ser: 0.57 mg/dL (ref 0.44–1.00)
GFR calc Af Amer: >60 mL/min (ref >60)
GFR calc non Af Amer: >60 mL/min (ref >60)
Glucose, Bld: 124 mg/dL — ABNORMAL HIGH (ref 65–99)
Potassium: 3.7 mmol/L (ref 3.5–5.1)
Sodium: 138 mmol/L (ref 135–145)
Total Bilirubin: 0.4 mg/dL (ref 0.3–1.2)
Total Protein: 7 g/dL (ref 6.5–8.1)

## 2017-05-08 MED ORDER — METHYLPREDNISOLONE SODIUM SUCC 125 MG IJ SOLR
125.0000 mg | Freq: Once | INTRAMUSCULAR | Status: DC
  Filled 2017-05-08: qty 2

## 2017-05-08 MED ORDER — IOPAMIDOL (ISOVUE-300) INJECTION 61%
75.0000 mL | Freq: Once | INTRAVENOUS | Status: AC | PRN
  Administered 2017-05-08: 75 mL via INTRAVENOUS
  Filled 2017-05-08: qty 75

## 2017-05-08 MED ORDER — VALACYCLOVIR HCL 500 MG PO TABS: 1000.0000 mg | ORAL_TABLET | Freq: Three times a day (TID) | ORAL | 0 refills | Status: AC

## 2017-05-08 MED ORDER — METHYLPREDNISOLONE SODIUM SUCC 125 MG IJ SOLR
125.0000 mg | Freq: Once | INTRAMUSCULAR | Status: AC
  Administered 2017-05-08: 125 mg via INTRAVENOUS

## 2017-05-08 MED ORDER — PREDNISONE 10 MG (21) PO TBPK: ORAL_TABLET | ORAL | 0 refills | Status: DC

## 2017-05-08 NOTE — ED Notes (Signed)
PA at bedside answering further questions for patient.  Patient denies any other questions and has full understanding of paperwork.

## 2017-05-08 NOTE — ED Provider Notes (Signed)
[facility] Emergency Department Provider Note  ____________________________________________  Time seen: Approximately 11:54 PM  I have reviewed the triage vital signs and the nursing notes.   HISTORY  Chief Complaint Rash    HPI Tracey Lester is a 46 y.o. female presenting to the emergency department with an erythematous, macular rash with vesicular formation of the face, ear and neck neck. Patient has experienced symptoms for the past 6 days. Patient describes rash as pruritic. She has noticed headache but denies other associated symptoms. Patient states that she had chickenpox as a child. Patient states that she sought care with her primary care provider who was concerned for infection of the lymphatic system. Patient has been taking Keflex and has been applying mupirocin ointment. Patient states that her symptoms have not improved. She denies associated chest pain, chest tightness, shortness of breath, nausea, vomiting or abdominal pain. No recent travel.   History reviewed. No pertinent past medical history.  There are no active problems to display for this patient.   Past Surgical History:  Procedure Laterality Date  . TONSILLECTOMY      Prior to Admission medications   Medication Sig Start Date End Date Taking? Authorizing Provider  predniSONE (STERAPRED UNI-PAK 21 TAB) 10 MG (21) TBPK tablet Take 6 tablets the first day, take 5 tablets the second day, take 4 tablets the third day, take 3 tablets the fourth day, take 2 tablets the fifth day, take 1 tablet the sixth day. 05/08/17   Orvil Feil, PA-C  valACYclovir (VALTREX) 500 MG tablet Take 2 tablets (1,000 mg total) by mouth 3 (three) times daily. 05/08/17 05/15/17  Orvil Feil, PA-C    Allergies Codeine  No family history on file.  Social History Social History  Substance Use Topics  . Smoking status: Former Games developer  . Smokeless tobacco: Not on file  . Alcohol use Not on file      Review of Systems  Constitutional: No fever/chills Eyes: No visual changes. No discharge ENT: No upper respiratory complaints. Cardiovascular: no chest pain. Respiratory: no cough. No SOB. Musculoskeletal: Negative for musculoskeletal pain. Skin: Patient has rash Neurological: Negative for headaches, focal weakness or numbness.   ____________________________________________   PHYSICAL EXAM:  VITAL SIGNS: ED Triage Vitals  Enc Vitals Group     BP 05/08/17 1636 136/84     Pulse Rate 05/08/17 1636 85     Resp 05/08/17 1636 20     Temp 05/08/17 1636 98.8 F (37.1 C)     Temp Source 05/08/17 1636 Oral     SpO2 05/08/17 1636 98 %     Weight 05/08/17 1637 160 lb (72.6 kg)     Height 05/08/17 1637 5\' 10"  (1.778 m)     Head Circumference --      Peak Flow --      Pain Score 05/08/17 1634 5     Pain Loc --      Pain Edu? --      Excl. in GC? --      Constitutional: Alert and oriented. Well appearing and in no acute distress. Eyes: Conjunctivae are normal. PERRL. EOMI. Head: Atraumatic. ENT:      Ears: Tympanic membranes are pearly bilaterally. No rash of the external auditory canals bilaterally.      Nose: No congestion/rhinnorhea.      Mouth/Throat: Mucous membranes are moist.  Neck: Full range of motion. Hematological/Lymphatic/Immunilogical: Patient has palpable cervical and supraclavicular lymphadenopathy Cardiovascular: Normal rate, regular rhythm. Normal S1  and S2.  Good peripheral circulation. Respiratory: Normal respiratory effort without tachypnea or retractions. Lungs CTAB. Good air entry to the bases with no decreased or absent breath sounds. Musculoskeletal: Full range of motion to all extremities. No gross deformities appreciated. Neurologic:  Normal speech and language. No gross focal neurologic deficits are appreciated.  Skin: Patient has an erythematous, macular rash with vesicular formation of the right cheek, right ear and right lateral neck. Rash  does not cross the midline and follows a dermatomal distribution. Psychiatric: Mood and affect are normal. Speech and behavior are normal. Patient exhibits appropriate insight and judgement.   ____________________________________________   LABS (all labs ordered are listed, but only abnormal results are displayed)  Labs Reviewed  CBC WITH DIFFERENTIAL/PLATELET - Abnormal; Notable for the following:       Result Value   RDW 15.2 (*)    All other components within normal limits  COMPREHENSIVE METABOLIC PANEL - Abnormal; Notable for the following:    Glucose, Bld 124 (*)    ALT 13 (*)    All other components within normal limits   ____________________________________________  EKG   ____________________________________________  RADIOLOGY Geraldo Pitter, personally viewed and evaluated these images (plain radiographs) as part of my medical decision making, as well as reviewing the written report by the radiologist.  Ct Soft Tissue Neck W Contrast  Result Date: 05/08/2017 CLINICAL DATA:  Right neck swelling an adenopathy despite antibiotics. But bite 6 days ago. Supraclavicular lymph nodes and rash along the anterior cervical chain. EXAM: CT NECK WITH CONTRAST TECHNIQUE: Multidetector CT imaging of the neck was performed using the standard protocol following the bolus administration of intravenous contrast. CONTRAST:  75mL ISOVUE-300 IOPAMIDOL (ISOVUE-300) INJECTION 61% COMPARISON:  None. FINDINGS: Pharynx and larynx: No significant mucosal or submucosal lesions are present. There is mild prominence of the lingual and palatine tonsils. No mass lesion is present. There is no abscess. The hypopharynx is clear. The vocal cords are midline and symmetric. Salivary glands: The submandibular glands are within normal limits bilaterally. Thyroid: The thyroid is heterogeneous and mildly enlarged, asymmetric on the right. No dominant nodule is present. Lymph nodes: Enlarged right submandibular lymph  nodes measure up to 15 x 13 mm. These are likely reactive. There mild prominence of right level 2 and level 3 lymph nodes are present. Insert asymmetrically enlarged compared to the left. The nodes appear reactive. Vascular: No focal vascular lesions are present. Limited intracranial: Within normal limits. Visualized orbits: The visualized globes and orbits are unremarkable. Mastoids and visualized paranasal sinuses: The paranasal sinuses and mastoid air cells are clear. Skeleton: Vertebral body heights and alignment are maintained. There straightening of the normal cervical lordosis. Upper chest: The lung apices are clear. The superior mediastinum is within normal limits. Other: Focal skin thickening is evident in the right posterolateral neck just below the level of the mandible. There is no abscess or deep extension. IMPRESSION: 1. Focal skin thickening and subcutaneous stranding in the right posterolateral neck at the level of C3. 2. Asymmetric right submandibular and level 2 adenopathy is likely reactive. 3. Prominent lymphoid tissue involving the lingual and palatine tonsils bilaterally. Pharyngitis is also considered. 4. Reactive lymph nodes may be secondary to the superficial lesion Electronically Signed   By: Marin Roberts M.D.   On: 05/08/2017 20:40    ____________________________________________    PROCEDURES  Procedure(s) performed:    Procedures    Medications  iopamidol (ISOVUE-300) 61 % injection 75 mL (75 mLs Intravenous  Contrast Given 05/08/17 2006)  methylPREDNISolone sodium succinate (SOLU-MEDROL) 125 mg/2 mL injection 125 mg (125 mg Intravenous Given 05/08/17 2156)     ____________________________________________   INITIAL IMPRESSION / ASSESSMENT AND PLAN / ED COURSE  Pertinent labs & imaging results that were available during my care of the patient were reviewed by me and considered in my medical decision making (see chart for details).  Review of the [place] CSRS  was performed in accordance of the [facility] prior to dispensing any controlled drugs.     Assessment and plan: Shingles: Patient presents to the emergency department with an erythematous, macular rash with vesicular formation of the right face, right neck and right ear for the past 6 days. Due to primary care providers concern for lymphadenitis with palpable supraclavicular lymph nodes on physical exam, CBC, CMP and CT soft tissue neck with contrast were obtained. CBC and CMP were reassuring. CT soft tissue neck revealed reactive lymphadenopathy but no other concerning features. History and physical exam findings are consistent with zoster. Patient was given an injection of Solu-Medrol in the emergency department. She was discharged with prednisone and Valtrex. Patient was advised to follow-up with primary care. All patient questions were answered.   ____________________________________________  FINAL CLINICAL IMPRESSION(S) / ED DIAGNOSES  Final diagnoses:  Herpes zoster without complication      NEW MEDICATIONS STARTED DURING THIS VISIT:  Discharge Medication List as of 05/08/2017  9:49 PM    START taking these medications   Details  predniSONE (STERAPRED UNI-PAK 21 TAB) 10 MG (21) TBPK tablet Take 6 tablets the first day, take 5 tablets the second day, take 4 tablets the third day, take 3 tablets the fourth day, take 2 tablets the fifth day, take 1 tablet the sixth day., Print    valACYclovir (VALTREX) 500 MG tablet Take 2 tablets (1,000 mg total) by mouth 3 (three) times daily., Starting Sat 05/08/2017, Until Sat 05/15/2017, Print            This chart was dictated using voice recognition software/Dragon. Despite best efforts to proofread, errors can occur which can change the meaning. Any change was purely unintentional.    Orvil FeilWoods, Ambrose Wile M, PA-C 05/09/17 0009    Rockne MenghiniNorman, Anne-Caroline, MD 05/11/17 [phone]

## 2017-05-08 NOTE — ED Triage Notes (Signed)
States 6 days ago what whe thought was insect bite R outer ear, noted swollen. Since small areas of itching or pain back of neck, in hair line ear and chest all on R side.

## 2017-05-08 NOTE — ED Notes (Signed)
See triage note  States she is here with pain to right ear  States she was stung by something about 6 days ago

## 2017-10-08 ENCOUNTER — Other Ambulatory Visit: Payer: Self-pay

## 2017-10-08 ENCOUNTER — Encounter: Payer: Self-pay | Admitting: Psychiatry

## 2017-10-08 ENCOUNTER — Ambulatory Visit (INDEPENDENT_AMBULATORY_CARE_PROVIDER_SITE_OTHER): Payer: BC Managed Care – PPO | Admitting: Psychiatry

## 2017-10-08 VITALS — BP 126/80 | HR 75 | Temp 98.7°F | Wt 165.8 lb

## 2017-10-08 DIAGNOSIS — F411 Generalized anxiety disorder: Secondary | ICD-10-CM

## 2017-10-08 DIAGNOSIS — F330 Major depressive disorder, recurrent, mild: Secondary | ICD-10-CM

## 2017-10-08 DIAGNOSIS — F900 Attention-deficit hyperactivity disorder, predominantly inattentive type: Secondary | ICD-10-CM | POA: Diagnosis not present

## 2017-10-08 MED ORDER — LAMOTRIGINE 100 MG PO TABS: 100.0000 mg | ORAL_TABLET | Freq: Every day | ORAL | 2 refills | Status: DC

## 2017-10-08 MED ORDER — CLONAZEPAM 0.5 MG PO TABS: 0.5000 mg | ORAL_TABLET | Freq: Every day | ORAL | 1 refills | Status: DC | PRN

## 2017-10-08 MED ORDER — SERTRALINE HCL 100 MG PO TABS: 200.0000 mg | ORAL_TABLET | Freq: Every day | ORAL | 2 refills | Status: DC

## 2017-10-08 MED ORDER — AMPHETAMINE-DEXTROAMPHETAMINE 15 MG PO TABS: 15.0000 mg | ORAL_TABLET | Freq: Every day | ORAL | 0 refills | Status: DC

## 2017-10-08 MED ORDER — AMPHETAMINE-DEXTROAMPHET ER 25 MG PO CP24: 25.0000 mg | ORAL_CAPSULE | ORAL | 0 refills | Status: DC

## 2017-10-08 NOTE — Progress Notes (Signed)
Psychiatric Initial Adult Assessment   Patient Identification: Tracey Lester MRN:  [id number] Date of Evaluation:  10/08/2017 Referral Source: Maryelizabeth RowanElizabeth Dewey MD. Chief Complaint:  " I am anxious.'  Chief Complaint    Establish Care; Anxiety; Depression; ADD; Panic Attack     Visit Diagnosis:    ICD-10-CM   1. MDD (major depressive disorder), recurrent episode, mild (HCC) F33.0 sertraline (ZOLOFT) 100 MG tablet    lamoTRIgine (LAMICTAL) 100 MG tablet  2. GAD (generalized anxiety disorder) F41.1 clonazePAM (KLONOPIN) 0.5 MG tablet  3. Attention deficit hyperactivity disorder (ADHD), predominantly inattentive type F90.0 amphetamine-dextroamphetamine (ADDERALL XR) 25 MG 24 hr capsule    amphetamine-dextroamphetamine (ADDERALL) 15 MG tablet    History of Present Illness: Tracey Lester is a 46 year old Caucasian female who who is married, who lives in [place], who presented to the clinic today to establish care.  Tracey Lester reports that she has been struggling with depression, anxiety, ADD as well as sleep disorder since the past several years.  She reports that she was initially diagnosed with postpartum depression and that with the birth of each child her depression started getting recurrent.  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  Patient today reports that she is currently on medications like Zoloft 200 mg, lamotrigine 100 mg, Klonopin as needed, Ambien,  which has been keeping her depressive symptoms and anxiety symptoms under control.  She however reports that she continues to have some anxiety attacks on and off for which she needs Klonopin as needed.  She reports that her husband is a big stressor for her at this time.  She reports that her husband is a good man and he provides but he can get aggressive and angry.  She reports that he is mad all the time.  She reports that she takes Klonopin 0.5 mg as needed 3-4 times a week and this usually happens  towards the end of the day when her husband comes back home.  She reports that she has anxiety symptoms like racing heart rate, chest pain, shortness of breath which can last for a few minutes and can peak in a few minutes and then it resolves.  She reports that she and her husband has gone for psychotherapy and marriage therapy in the past while they were in [place].  And she reports that it was really helpful and it saved her marriage at that time.  Patient reports that she would like to go back for psychotherapy and possibly marriage therapy again.  She also reports a history of ADHD.  She reports she is currently being prescribed Adderall XR and immediate release by her PMD.  Reviewed [place] controlled substance database.  Discussed with patient that we need to request medical records from her PMD.  She agrees with the plan.  She denies any side effects to any of the medications that she is on right now.  Patient at this time denies any perceptual disturbances.  Patient denies any history of hypomania or manic symptoms.  Patient denies any OCD kind of symptoms.  Patient denies any history of sexual or physical trauma.  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  She has been under the care of psychiatrist in [place], [place] in the past.  She  also saw a psychotherapist in [place] in the past.  She reports she went to see Dr. Sondra Barges here in [place] recently in 2018.  She reports she stopped going because of some administrative problems there.  She denies any suicide attempts.  She denies any inpatient admissions for psychiatric problems in the past.  Previous Psychotropic Medications: Yes - prozac  ( made her happy) , Wellbutrin( mad),Cymbalta( too strong)   Substance Abuse History in the last 12 months:  No.  Consequences of Substance Abuse: Negative  Past Medical History:  Past Medical History:  Diagnosis Date  . ADHD (attention deficit hyperactivity disorder)   . Anxiety   . Depression     Past Surgical History:  Procedure Laterality Date  . TONSILLECTOMY      Family Psychiatric History: Both parents-anxiety, depression.  Maternal grandmother-depression.  Paternal grandmother-schizophrenia.  Has history of suicide in her family.  Family History:  Family History  Problem Relation Age of Onset  . Anxiety disorder Mother   . Depression Mother   . Anxiety disorder Father   . Depression Father   . Schizophrenia Paternal Grandmother   . Depression Maternal Grandmother     Social History:   Social History   Socioeconomic History  . Marital status: Married    Spouse name: michael  . Number of children: 3  . Years of education: None  . Highest education level: Associate degree: occupational, Scientist, product/process development, or vocational program  Social Needs  . Financial resource strain: Not hard at all  . Food insecurity - worry: Never true  . Food insecurity - inability: Never true  . Transportation needs - medical: No  . Transportation needs - non-medical: No  Occupational History  . None  Tobacco Use  . Smoking status: Former Smoker    Types: Cigarettes    Last attempt to quit: 10/08/2006    Years since quitting: 11.0  . Smokeless tobacco: Never Used  Substance and Sexual Activity  . Alcohol use: Yes    Alcohol/week: 3.0 oz    Types: 4 Glasses of wine, 1 Cans of beer per week  . Drug use: No  . Sexual activity: Yes    Birth control/protection: None  Other Topics Concern  . None  Social History Narrative  . None    Additional Social History: She was born in [place].  She reports she had a good upbringing.  She was raised by both parents.  Her parents got divorced after  she moved out of the house as an adult.  She used to work as a Scientist, research (medical) in the past.  She is married , has three children aged 80,8,3y. Husband works at [employer].  She and her husband recently relocated to [place] 1-1/2 years ago. Allergies:   Allergies  Allergen Reactions  . Codeine Itching    Metabolic Disorder Labs: No results found for: HGBA1C, MPG No results found for: PROLACTIN No results found for: CHOL, TRIG, HDL, CHOLHDL, VLDL, LDLCALC   Current Medications: Current Outpatient Medications  Medication Sig Dispense Refill  . amphetamine-dextroamphetamine (ADDERALL XR) 25 MG 24 hr capsule Take 1 capsule by mouth every morning. 30 capsule 0  . amphetamine-dextroamphetamine (ADDERALL) 15 MG tablet Take 1 tablet by mouth daily. Take it in the afternoon 30 tablet 0  . lamoTRIgine (LAMICTAL) 100 MG tablet Take 1 tablet (100 mg total) by mouth daily. 30 tablet 2  . sertraline (ZOLOFT) 100 MG tablet Take 2 tablets (200 mg total) by mouth daily. 60  tablet 2  . zolpidem (AMBIEN) 5 MG tablet     . clonazePAM (KLONOPIN) 0.5 MG tablet Take 1 tablet (0.5 mg total) by mouth daily as needed for anxiety. 30 tablet 1   No current facility-administered medications for this visit.     Neurologic: Headache: No Seizure: No Paresthesias:No  Musculoskeletal: Strength & Muscle Tone: within normal limits Gait & Station: normal Patient leans: N/A  Psychiatric Specialty Exam: Review of Systems  Psychiatric/Behavioral: The patient is nervous/anxious.   All other systems reviewed and are negative.   Blood pressure 126/80, pulse 75, temperature 98.7 F (37.1 C), temperature source Oral, weight 165 lb 12.8 oz (75.2 kg).Body mass index is 23.79 kg/m.  General Appearance: Casual  Eye Contact:  Fair  Speech:  Clear and Coherent  Volume:  Normal  Mood:  Anxious  Affect:  Tearful  Thought Process:  Goal Directed and Descriptions of Associations: Intact  Orientation:  Full (Time, Place, and Person)   Thought Content:  Logical  Suicidal Thoughts:  No  Homicidal Thoughts:  No  Memory:  Immediate;   Fair Recent;   Fair Remote;   Fair  Judgement:  Fair  Insight:  Fair  Psychomotor Activity:  Normal  Concentration:  Concentration: Fair and Attention Span: Fair  Recall:  [employer] of Knowledge:Fair  Language: Fair  Akathisia:  No  Handed:  Right  AIMS (if indicated):  NA  Assets:  Communication Skills Desire for Improvement Housing Physical Health Social Support Talents/Skills Transportation Vocational/Educational  ADL's:  Intact  Cognition: WNL  Sleep:  fair    Treatment Plan Summary: Aseel is a 46 year old Caucasian female who has a history of depression, anxiety, ADD who presented to the clinic today to establish care.  Ozell reports that her medications are currently working and are keeping her mood symptoms as well as sleep problems under control.  She wanted to find a psychiatrist who could continue to prescribe her medications since her medications were being prescribed by her PMD until now.  She reports she recently relocated to [place] a year and a half ago.  Patient currently has stressors of relationship issues with her husband.  Patient reports that her husband has anger management issues.  This is making her anxiety worse.  Patient is interested in pursuing therapy again.  Discussed medication management and plan as noted below. Medication management and Plan see below Plan  Depression Continue Zoloft 200 mg p.o. daily Continue lamotrigine 100 mg p.o. daily Refer for CBT-provided Inetta Fermo Thompson's information.  For anxiety Continue Zoloft 200 mg p.o. daily Continue Klonopin 0.5 mg p.o. daily as needed.  Discussed with the patient the effect of benzodiazepine, and the need to taper it off.  Discussed relaxation techniques and to use distraction and other techniques before using the Klonopin.  She would gradually try to taper off the Klonopin. Reviewed [place]  controlled substance database.  For insomnia Continue Ambien 5 mg p.o. daily.  For ADD Continue Adderall XR 25 mg p.o. daily Continue Adderall 15 mg p.o. daily in the afternoon. Provided scripts with date specified Reviewed  controlled substance database.  Discussed with patient to sign medical releases consent to obtain information from her PMD.  Provided medication education, provided handouts.  She will also benefit from family/marital therapy.  Will refer for the same.  Follow-up in 4 weeks or sooner if needed.  This note was generated in part or whole with voice recognition software. Voice recognition is usually quite accurate but there  are transcription errors that can and very often do occur. I apologize for any typographical errors that were not detected and corrected.      Jomarie LongsSaramma Aadarsh Cozort, MD 12/14/201812:15 PM

## 2017-11-09 ENCOUNTER — Encounter: Payer: Self-pay | Admitting: Psychiatry

## 2017-11-09 ENCOUNTER — Ambulatory Visit: Payer: BC Managed Care – PPO | Admitting: Psychiatry

## 2017-11-09 ENCOUNTER — Other Ambulatory Visit: Payer: Self-pay

## 2017-11-09 VITALS — BP 117/76 | HR 71 | Temp 98.4°F | Wt 172.4 lb

## 2017-11-09 DIAGNOSIS — F411 Generalized anxiety disorder: Secondary | ICD-10-CM

## 2017-11-09 DIAGNOSIS — F900 Attention-deficit hyperactivity disorder, predominantly inattentive type: Secondary | ICD-10-CM

## 2017-11-09 DIAGNOSIS — F330 Major depressive disorder, recurrent, mild: Secondary | ICD-10-CM

## 2017-11-09 MED ORDER — AMPHETAMINE-DEXTROAMPHETAMINE 15 MG PO TABS: 15.0000 mg | ORAL_TABLET | Freq: Every day | ORAL | 0 refills | Status: DC

## 2017-11-09 MED ORDER — AMPHETAMINE-DEXTROAMPHET ER 25 MG PO CP24: 25.0000 mg | ORAL_CAPSULE | ORAL | 0 refills | Status: DC

## 2017-11-09 NOTE — Progress Notes (Signed)
BH MD  OP Progress Note  11/09/2017 11:04 AM Tracey Lester  MRN:  [id number]  Chief Complaint: ' I am doing ok."  Chief Complaint    Follow-up; Medication Refill     HPI: Tracey Lester is a 47 year old Caucasian female who is married, lives in [place], presented to the clinic today for a follow-up visit.  Tracey Lester struggles with depression, anxiety as well as ADHD.  Tracey Lester  reports her depression and anxiety seems to be stable at this time on the current medications.  She is on Zoloft and lamotrigine as well as Klonopin as needed for anxiety.  She reports she has been able to cut down the Klonopin and takes it less frequently than she used to before.  She reports she is able to look at her relationship issues with her husband from a different angle.  She reports her husband continues to have irritability and anger issues but she has learned to take a step back without getting agitated and anxious when he does that.  She also reports by doing so,  his agitation comes down and they are not having a lot of arguments anymore.  She has not pursued psychotherapy  but she would be interested  in the future.  She continues to have some psychosocial stressors from her 45-year-old daughter having her own mental health issues.  She reports her child gets anxious and has been struggling with separation anxiety and sleep all her life.  She reports it is getting a little bit better but since her daughter needs her at night and can wake her up in the middle of the night , this has also been affecting her sleep.  Patient continues to take Ambien at bedtime for sleep.  She however reports her sleep is never solid because it is disrupted due to her daughter needing her.  She however reports she feels okay when she wakes up in the morning.  She denies any side effects to any medications.  She denies abusing any drugs or alcohol at this time.   Visit Diagnosis:    ICD-10-CM   1. MDD (major depressive disorder),  recurrent episode, mild (HCC) F33.0   2. GAD (generalized anxiety disorder) F41.1   3. 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  She has been under the care of psychiatrist in [place], [place] in the past.  She also saw a psychotherapist in [place].  She reports she went to see Dr. Sondra BargesSusan Bowen [place] in [place] recently in 2018.  She reports she stopped going because of some administrative problems there.  She denies any suicide attempts.  She denies any inpatient admissions for psychiatric problems in the past. Past trials of medications like Prozac (made her happy), Wellbutrin (mad), Cymbalta (too strong).  Past Medical History:  Past Medical History:  Diagnosis Date  . ADHD (attention deficit hyperactivity disorder)   . Anxiety   . Depression     Past Surgical History:  Procedure Laterality Date  . TONSILLECTOMY      Family Psychiatric History: Both parents-anxiety, depression.  Maternal grandmother-depression.  Paternal grandmother-schizophrenia.  Has history of suicide in her family.  Family History:  Family History  Problem Relation Age of Onset  . Anxiety disorder Mother   . Depression Mother   . Anxiety disorder Father   . Depression Father   . Schizophrenia Paternal Grandmother   . Depression Maternal Grandmother     Social History: She was born in [place].  She reports she had a good upbringing.  She was raised by both parents.  Her parents got divorced after she moved out of the house as an adult.  She used to work as a Scientist, research (medical) in the past.   She is married, has 3 children aged 23, 3, 15-year-old.  Husband works at [employer].  She and her husband recently relocated to [place] 1 to 1 and1/2 years ago. Social History   Socioeconomic History  . Marital status: Married    Spouse name: michael  . Number of children: 3  . Years of education: None  . Highest education level: Associate degree: occupational, Scientist, product/process development, or vocational program  Social Needs  . Financial resource strain: Not hard at all  . Food insecurity - worry: Never true  . Food insecurity - inability: Never true  . Transportation needs - medical: No  . Transportation needs - non-medical: No  Occupational History  . None  Tobacco Use  . Smoking status: Former Smoker    Types: Cigarettes    Last attempt to quit: 10/08/2006    Years since quitting: 11.0  . Smokeless tobacco: Never Used  Substance and Sexual Activity  . Alcohol use: Yes    Alcohol/week: 3.0 oz    Types: 4 Glasses of wine, 1 Cans of beer per week  . Drug use: No  . Sexual activity: Yes    Birth control/protection: None  Other Topics Concern  . None  Social History Narrative  . None    Allergies:  Allergies  Allergen Reactions  . Codeine Itching    Metabolic Disorder Labs: No results found for: HGBA1C, MPG No results found for: PROLACTIN No results found for: CHOL, TRIG, HDL, CHOLHDL, VLDL, LDLCALC No results found for: TSH  Therapeutic Level Labs: No results found for: LITHIUM No results found for: VALPROATE No components found for:  CBMZ  Current Medications: Current Outpatient Medications  Medication Sig Dispense Refill  . amphetamine-dextroamphetamine (ADDERALL XR) 25 MG 24 hr capsule Take 1 capsule by mouth every morning. 30 capsule 0  . amphetamine-dextroamphetamine (ADDERALL) 15 MG tablet Take 1 tablet by mouth daily. Take it in the afternoon 30 tablet 0  . clonazePAM (KLONOPIN) 0.5 MG tablet Take 1 tablet (0.5 mg total) by mouth daily as needed for anxiety. 30 tablet 1  .  lamoTRIgine (LAMICTAL) 100 MG tablet Take 1 tablet (100 mg total) by mouth daily. 30 tablet 2  . sertraline (ZOLOFT) 100 MG tablet Take 2 tablets (200 mg total) by mouth daily. 60 tablet 2  . zolpidem (AMBIEN) 5 MG tablet      No current facility-administered medications for this visit.      Musculoskeletal: Strength & Muscle Tone: within normal limits Gait & Station: normal Patient leans: N/A  Psychiatric Specialty Exam: Review of Systems  Psychiatric/Behavioral: The patient is nervous/anxious and has insomnia.   All other systems reviewed and are negative.   Blood pressure 117/76, pulse 71, temperature 98.4 F (36.9 C), temperature source Oral, weight 172 lb 6.4 oz (78.2 kg).Body mass index is 24.74 kg/m.  General Appearance: Casual  Eye Contact:  Fair  Speech:  Clear and Coherent  Volume:  Normal  Mood:  Euthymic  Affect:  Congruent  Thought Process:  Goal Directed and Descriptions of Associations: Intact  Orientation:  Full (Time, Place, and Person)  Thought Content: Logical   Suicidal Thoughts:  No  Homicidal Thoughts:  No  Memory:  Immediate;   Fair Recent;   Fair Remote;   Fair  Judgement:  Fair  Insight:  Fair  Psychomotor Activity:  Normal  Concentration:  Concentration: Fair and Attention Span: Fair  Recall:  [employer] of Knowledge: Poor  Language: Fair  Akathisia:  No  Handed:  Right  AIMS (if indicated): NA  Assets:  Communication Skills Desire for Improvement Financial Resources/Insurance Housing Intimacy Leisure Time Physical Health Resilience Social Support Talents/Skills Transportation Vocational/Educational  ADL's:  Intact  Cognition: WNL  Sleep:  disrupted   Screenings:   Assessment and Plan: Vance Gather is a 47 year old Caucasian female who has a history of depression, anxiety, ADD who presented to the clinic today for a follow-up visit.  She  continues to have some psychosocial stressors, relational issues with her husband as well as  mental health issues of her 38-year-old daughter.  Kayley however is currently more stable on the current medications that she is on.  She denies any side effects.  She is also interested in pursuing psychotherapy.  Plan as noted below.  Plan For depression Continue Zoloft 200 mg p.o. daily Continue lamotrigine 100 mg p.o. Daily Referred for CBT-provided Ms. Inetta Fermo Thompson's information  For anxiety Continue Zoloft 200 mg p.o. daily Continue Klonopin 0.5 mg p.o. daily as needed.  Discussed with the patient the effect of benzodiazepine and the need to taper it off.  Discussed relaxation techniques, distraction and other techniques before using the Klonopin.  She agrees to gradually being tapered off of the Klonopin.  Insomnia Continue Ambien 5 mg p.o. daily.  For ADD Continue Adderall XR 25 mg p.o. daily Continue Adderall 15 mg p.o. Daily in the afternoon. Provided 2 scripts with dates specified for each dosage. Reviewed [place] controlled substance database.   Follow-up in 2 months or sooner if needed  More than 50 % of the time was spent for psychoeducation and supportive psychotherapy and care coordination.  This note was generated in part or whole with voice recognition software. Voice recognition is usually quite accurate but there are transcription errors that can and very often do occur. I apologize for any typographical errors that were not detected and corrected.      Jomarie Longs, MD 11/09/2017, 11:04 AM

## 2017-12-21 ENCOUNTER — Other Ambulatory Visit: Payer: Self-pay | Admitting: Family Medicine

## 2017-12-21 DIAGNOSIS — Z1231 Encounter for screening mammogram for malignant neoplasm of breast: Secondary | ICD-10-CM

## 2018-01-06 ENCOUNTER — Ambulatory Visit: Payer: BC Managed Care – PPO | Admitting: Psychiatry

## 2018-01-06 ENCOUNTER — Other Ambulatory Visit: Payer: Self-pay

## 2018-01-06 ENCOUNTER — Encounter: Payer: Self-pay | Admitting: Psychiatry

## 2018-01-06 VITALS — BP 122/81 | HR 83 | Temp 97.8°F | Wt 170.4 lb

## 2018-01-06 DIAGNOSIS — F330 Major depressive disorder, recurrent, mild: Secondary | ICD-10-CM

## 2018-01-06 DIAGNOSIS — F900 Attention-deficit hyperactivity disorder, predominantly inattentive type: Secondary | ICD-10-CM

## 2018-01-06 DIAGNOSIS — F411 Generalized anxiety disorder: Secondary | ICD-10-CM | POA: Diagnosis not present

## 2018-01-06 MED ORDER — AMPHETAMINE-DEXTROAMPHET ER 25 MG PO CP24: 25.0000 mg | ORAL_CAPSULE | ORAL | 0 refills | Status: DC

## 2018-01-06 MED ORDER — LAMOTRIGINE 25 MG PO TABS: 50.0000 mg | ORAL_TABLET | Freq: Every day | ORAL | 3 refills | Status: DC

## 2018-01-06 MED ORDER — ZOLPIDEM TARTRATE 5 MG PO TABS: 5.0000 mg | ORAL_TABLET | Freq: Every day | ORAL | 2 refills | Status: DC

## 2018-01-06 MED ORDER — SERTRALINE HCL 100 MG PO TABS: 200.0000 mg | ORAL_TABLET | Freq: Every day | ORAL | 3 refills | Status: DC

## 2018-01-06 MED ORDER — AMPHETAMINE-DEXTROAMPHETAMINE 15 MG PO TABS: 15.0000 mg | ORAL_TABLET | Freq: Every day | ORAL | 0 refills | Status: DC

## 2018-01-06 NOTE — Progress Notes (Signed)
BH MD OP Progress Note  01/06/2018 3:29 PM Tracey Lester  MRN:  [id number]  Chief Complaint: ' I am better." Chief Complaint    Follow-up; Medication Refill     HPI: Tracey Lester is a 47 year old Caucasian female who is married, lives in [place], presented to the clinic today for a follow-up visit.  Tracey Lester struggles with depression, anxiety as well as ADHD.  Tracey Lester today presents as very pleasant and bright.  She reports she has started practicing yoga on a regular basis and that has been helping her with her anxiety and mood.  She reports she goes to a yoga studio several days a week.  She also has started practicing meditation at least for a few minutes with her husband at the end of the day.  She reports that her relationship with her husband has improved tremendously.  Patient reports her anxiety symptoms are more under control ever since she has been doing these techniques.  She reports she takes the Klonopin as needed once a week or so now.  Reports that not being worried about wanting the Klonopin and being able to not take it is a big improvement for her.  She also reports she has tapered herself off of the Lamictal from 100 mg to a 50 mg.  Reports she wants to taper herself off to minimal amount of medications as possible.  She reports her attention and concentration symptoms are better.  She is tolerating the Adderall as prescribed.  She reports she would also like to taper herself off of the Adderall to a lower dose if possible.  She reports sleep is improved and she continues to take Ambien at bedtime.  She reports she looks forward to going to a yoga retreat in July at [place], [place] ,for a week or so.    She denies any other concerns at this time. Visit Diagnosis:    ICD-10-CM   1. MDD (major depressive disorder), recurrent episode, mild (HCC) F33.0 sertraline (ZOLOFT) 100 MG tablet    lamoTRIgine (LAMICTAL) 25 MG tablet  2. GAD (generalized anxiety disorder) F41.1  lamoTRIgine (LAMICTAL) 25 MG tablet  3. Attention deficit hyperactivity disorder (ADHD), predominantly inattentive type F90.0 amphetamine-dextroamphetamine (ADDERALL XR) 25 MG 24 hr capsule    amphetamine-dextroamphetamine (ADDERALL) 15 MG tablet    DISCONTINUED: amphetamine-dextroamphetamine (ADDERALL XR) 25 MG 24 hr capsule    DISCONTINUED: amphetamine-dextroamphetamine (ADDERALL) 15 MG tablet    DISCONTINUED: amphetamine-dextroamphetamine (ADDERALL XR) 25 MG 24 hr capsule    DISCONTINUED: amphetamine-dextroamphetamine (ADDERALL) 15 MG tablet    Past Psychiatric History: She reports a history of postpartum depression, anxiety, ADD.  She has been under the care of psychiatrist in [place], [place] in the past.  She also saw a psychotherapist in [place].  She reports she went to see Dr. Sondra Barges here in [place] in 2018.  She reports she stopped going because of some administrative problems there.  She denies any suicide attempts.  She denies any inpatient admission for psychiatric problems in the past.  Past trials of medications like Prozac (made her happy), Wellbutrin( mad), Cymbalta (too strong).  Past Medical History:  Past Medical History:  Diagnosis Date  . ADHD (attention deficit hyperactivity disorder)   . Anxiety   . Depression     Past Surgical History:  Procedure Laterality Date  . TONSILLECTOMY      Family Psychiatric History: Both parents-anxiety, depression.  Maternal grandmother-depression.  Paternal grandmother-schizophrenia.    Family History:  Family History  Problem Relation Age of Onset  . Anxiety disorder Mother   . Depression Mother   . Anxiety disorder Father   . Depression Father   . Schizophrenia Paternal Grandmother   . Depression Maternal Grandmother    Substance abuse history: Denies  Social History: She was born in [place].  She reports she had a good upbringing.  She was raised by both parents.  Her parents got divorced  after she moved out of the house as an adult.  She used to work as a Scientist, research (medical)hairstylist in the past.  She is married, has 3 children aged 911, 468, 45 years old.  Husband works at [employer].  She and her husband recently [place]2 years ago. Social History   Socioeconomic History  . Marital status: Married    Spouse name: michael  . Number of children: 3  . Years of education: None  . Highest education level: Associate degree: occupational, Scientist, product/process developmenttechnical, or vocational program  Social Needs  . Financial resource strain: Not hard at all  . Food insecurity - worry: Never true  . Food insecurity - inability: Never true  . Transportation needs - medical: No  . Transportation needs - non-medical: No  Occupational History  . None  Tobacco Use  . Smoking status: Former Smoker    Types: Cigarettes    Last attempt to quit: 10/08/2006    Years since quitting: 11.2  . Smokeless tobacco: Never Used  Substance and Sexual Activity  . Alcohol use: Yes    Alcohol/week: 3.0 oz    Types: 4 Glasses of wine, 1 Cans of beer per week  . Drug use: No  . Sexual activity: Yes    Birth control/protection: None  Other Topics Concern  . None  Social History Narrative  . None    Allergies:  Allergies  Allergen Reactions  . Codeine Itching    Metabolic Disorder Labs: No results found for: HGBA1C, MPG No results found for: PROLACTIN No results found for: CHOL, TRIG, HDL, CHOLHDL, VLDL, LDLCALC No results found for: TSH  Therapeutic Level Labs: No results found for: LITHIUM No results found for: VALPROATE No components found for:  CBMZ  Current Medications: Current Outpatient Medications  Medication Sig Dispense Refill  . amphetamine-dextroamphetamine (ADDERALL XR) 25 MG 24 hr capsule Take 1 capsule by mouth every morning. 30 capsule 0  . amphetamine-dextroamphetamine (ADDERALL) 15 MG tablet Take 1 tablet by mouth daily. Take it in the afternoon 30 tablet 0  . clonazePAM (KLONOPIN) 0.5 MG tablet  Take 1 tablet (0.5 mg total) by mouth daily as needed for anxiety. 30 tablet 1  . lamoTRIgine (LAMICTAL) 100 MG tablet Take 1 tablet (100 mg total) by mouth daily. 30 tablet 2  . sertraline (ZOLOFT) 100 MG tablet Take 2 tablets (200 mg total) by mouth daily. 60 tablet 3  . zolpidem (AMBIEN) 5 MG tablet Take 1 tablet (5 mg total) by mouth at bedtime. 30 tablet 2  . lamoTRIgine (LAMICTAL) 25 MG tablet Take 2 tablets (50 mg total) by mouth daily. 60 tablet 3   No current facility-administered medications for this visit.      Musculoskeletal: Strength & Muscle Tone: within normal limits Gait & Station: normal Patient leans: N/A  Psychiatric Specialty Exam: Review of Systems  Psychiatric/Behavioral: The patient is nervous/anxious (IMPROVED).   All other systems reviewed and are negative.   Blood pressure 122/81, pulse 83, temperature 97.8 F (36.6 C), temperature source Oral, weight 170 lb  6.4 oz (77.3 kg), last menstrual period 01/02/2018.Body mass index is 24.45 kg/m.  General Appearance: Casual  Eye Contact:  Fair  Speech:  Clear and Coherent  Volume:  Normal  Mood:  Euthymic  Affect:  Appropriate  Thought Process:  Goal Directed and Descriptions of Associations: Intact  Orientation:  Full (Time, Place, and Person)  Thought Content: Logical   Suicidal Thoughts:  No  Homicidal Thoughts:  No  Memory:  Immediate;   Fair Recent;   Fair Remote;   Fair  Judgement:  Fair  Insight:  Fair  Psychomotor Activity:  Normal  Concentration:  Concentration: Fair and Attention Span: Fair  Recall:  [employer] of Knowledge: Fair  Language: Fair  Akathisia:  No  Handed:  Right  AIMS (if indicated): NA  Assets:  Communication Skills Desire for Improvement Financial Resources/Insurance Housing Intimacy Leisure Time Physical Health Resilience Social Support Talents/Skills Transportation Vocational/Educational  ADL's:  Intact  Cognition: WNL  Sleep:  Fair    Screenings:   Assessment and Plan: Tracey Lester is a 47 year old Caucasian female who has a history of depression, anxiety, ADD who presented to the clinic today for a follow-up visit.  Patient reports she is currently doing better with regards to her anxiety as well as depressive symptoms.  She reports she has started tapering off some of the medications on her own and would like to do so slowly.  She reports good benefit from yoga which she has been practicing on a regular basis now.  Discussed medication changes with patient.  Plan as noted below.  Plan For depression Continue Zoloft 200 mg p.o. daily.  Patient however reports she may try to go down to 150 mg if she tolerates it well. Continue Lamictal 50 mg p.o. daily.  Patient wants to continue to taper it down to 25 mg.  Discussed with patient that she can try the taper down but if she feels irritability or mood symptoms or notices a worsening of her anxiety symptoms to go back on the 50 mg again.  For anxiety Continue Zoloft as prescribed. Klonopin 0.5 mg p.o. daily as needed.  She reports she has not been using it too much.  She uses it may be once every week or so.  Insomnia Continue Ambien 5 mg p.o. daily. Provided prescription to be filled early April with 2 extra refills.  For ADD Continue Adderall XR 25 mg p.o. daily. Continue Adderall 15 mg p.o. daily in the afternoon Provided 3 scripts for each medication, last one dated to be filled on or after 03/30/2018. Reviewed [place] controlled substance database.  Follow-up in clinic in 2-3 months or sooner if needed.  More than 50 % of the time was spent for psychoeducation and supportive psychotherapy and care coordination.  This note was generated in part or whole with voice recognition software. Voice recognition is usually quite accurate but there are transcription errors that can and very often do occur. I apologize for any typographical errors that were not detected and  corrected.       Jomarie Longs, MD 01/06/2018, 3:29 PM

## 2018-01-07 ENCOUNTER — Ambulatory Visit: Payer: BC Managed Care – PPO

## 2018-04-08 ENCOUNTER — Other Ambulatory Visit: Payer: Self-pay

## 2018-04-08 ENCOUNTER — Encounter: Payer: Self-pay | Admitting: Psychiatry

## 2018-04-08 ENCOUNTER — Ambulatory Visit: Payer: BC Managed Care – PPO | Admitting: Psychiatry

## 2018-04-08 VITALS — BP 113/74 | HR 61 | Temp 98.5°F | Wt 161.8 lb

## 2018-04-08 DIAGNOSIS — F330 Major depressive disorder, recurrent, mild: Secondary | ICD-10-CM

## 2018-04-08 DIAGNOSIS — F411 Generalized anxiety disorder: Secondary | ICD-10-CM

## 2018-04-08 DIAGNOSIS — F900 Attention-deficit hyperactivity disorder, predominantly inattentive type: Secondary | ICD-10-CM | POA: Diagnosis not present

## 2018-04-08 MED ORDER — AMPHETAMINE-DEXTROAMPHETAMINE 5 MG PO TABS: 5.0000 mg | ORAL_TABLET | Freq: Every day | ORAL | 0 refills | Status: DC

## 2018-04-08 MED ORDER — ZOLPIDEM TARTRATE 5 MG PO TABS: 5.0000 mg | ORAL_TABLET | Freq: Every day | ORAL | 2 refills | Status: DC

## 2018-04-08 MED ORDER — SERTRALINE HCL 100 MG PO TABS: 200.0000 mg | ORAL_TABLET | Freq: Every day | ORAL | 1 refills | Status: DC

## 2018-04-08 MED ORDER — AMPHETAMINE-DEXTROAMPHET ER 20 MG PO CP24: 20.0000 mg | ORAL_CAPSULE | Freq: Every day | ORAL | 0 refills | Status: DC

## 2018-04-08 MED ORDER — CLONAZEPAM 0.5 MG PO TABS: 0.5000 mg | ORAL_TABLET | Freq: Every day | ORAL | 1 refills | Status: DC | PRN

## 2018-04-08 NOTE — Progress Notes (Signed)
BH MD OP Progress Note  04/08/2018 1:00 PM Tracey Lester  MRN:  [id number]  Chief Complaint: ' I am here for follow up." Chief Complaint    Follow-up; Medication Refill     HPI: Tracey Lester is a 47 year old Caucasian female who is married, lives in [place], presented to the clinic today for a follow-up visit.  Has a history of depression, anxiety as well as ADHD.  Patient today reports she has been doing well on the current medications.  She was completely able to get herself off of the Lamictal.  She continues to be on Zoloft 200 mg p.o. daily.  She reports she does not stop taking it.  She reports she tried weaning herself off of the Adderall to a lower dose.  However reports there were days when she felt she could barely keep herself awake.  She reports she did not like that feeling.  She also reports sleep problems.  Her 71-year-old daughter continues to have sleep problems at night.  She hence keeps her awake.  She hence reports her sleep also has been disrupted.  That is likely another reason why she feels sleepy during the day.  Patient reports she continues to wean herself off of the Klonopin and has been taking Klonopin once every week or so.  She denies any significant panic attacks or anxiety attacks recently.  She does report some psychosocial stressors.  Her husband lost his job and he is currently looking for another position.  She reports that has been stressful.  She however reports she looks forward to her paid her retreat this month.  She is going away for a yoga retreat.  She will be back in July.  She reports during that time her husband will take care of the children.  She denies any suicidality.  She denies any perceptual disturbances. Visit Diagnosis:    ICD-10-CM   1. MDD (major depressive disorder), recurrent episode, mild (HCC) F33.0 sertraline (ZOLOFT) 100 MG tablet  2. GAD (generalized anxiety disorder) F41.1 clonazePAM (KLONOPIN) 0.5 MG tablet  3. Attention deficit  hyperactivity disorder (ADHD), predominantly inattentive type F90.0     Past Psychiatric History: I have reviewed past psychiatric history from my progress note on 01/06/2018.  Past trials of Prozac-made her happy, Wellbutrin-married, Cymbalta-too strong.  Past Medical History:  Past Medical History:  Diagnosis Date  . ADHD (attention deficit hyperactivity disorder)   . Anxiety   . Depression     Past Surgical History:  Procedure Laterality Date  . TONSILLECTOMY      Family Psychiatric History: I have reviewed family psychiatric history from my progress note on 01/06/2018.  Family History:  Family History  Problem Relation Age of Onset  . Anxiety disorder Mother   . Depression Mother   . Anxiety disorder Father   . Depression Father   . Schizophrenia Paternal Grandmother   . Depression Maternal Grandmother    Substance abuse history: Denies  Social History: I have reviewed social history from my progress note on 01/06/2018. Patient's husband lost his job and is currently looking for another job. Social History   Socioeconomic History  . Marital status: Married    Spouse name: michael  . Number of children: 3  . Years of education: Not on file  . Highest education level: Associate degree: occupational, Scientist, product/process development, or vocational program  Occupational History  . Not on file  Social Needs  . Financial resource strain: Not hard at all  . Food insecurity:  Worry: Never true    Inability: Never true  . Transportation needs:    Medical: No    Non-medical: No  Tobacco Use  . Smoking status: Former Smoker    Types: Cigarettes    Last attempt to quit: 10/08/2006    Years since quitting: 11.5  . Smokeless tobacco: Never Used  Substance and Sexual Activity  . Alcohol use: Yes    Alcohol/week: 3.0 oz    Types: 4 Glasses of wine, 1 Cans of beer per week  . Drug use: No  . Sexual activity: Yes    Birth control/protection: None  Lifestyle  . Physical activity:    Days  per week: 4 days    Minutes per session: Not on file  . Stress: Very much  Relationships  . Social connections:    Talks on phone: More than three times a week    Gets together: More than three times a week    Attends religious service: More than 4 times per year    Active member of club or organization: No    Attends meetings of clubs or organizations: Never    Relationship status: Married  Other Topics Concern  . Not on file  Social History Narrative  . Not on file    Allergies:  Allergies  Allergen Reactions  . Codeine Itching    Metabolic Disorder Labs: No results found for: HGBA1C, MPG No results found for: PROLACTIN No results found for: CHOL, TRIG, HDL, CHOLHDL, VLDL, LDLCALC No results found for: TSH  Therapeutic Level Labs: No results found for: LITHIUM No results found for: VALPROATE No components found for:  CBMZ  Current Medications: Current Outpatient Medications  Medication Sig Dispense Refill  . clonazePAM (KLONOPIN) 0.5 MG tablet Take 1 tablet (0.5 mg total) by mouth daily as needed for anxiety. 30 tablet 1  . sertraline (ZOLOFT) 100 MG tablet Take 2 tablets (200 mg total) by mouth daily. 180 tablet 1  . zolpidem (AMBIEN) 5 MG tablet Take 1 tablet (5 mg total) by mouth at bedtime. 30 tablet 2  . amphetamine-dextroamphetamine (ADDERALL XR) 20 MG 24 hr capsule Take 1 capsule (20 mg total) by mouth daily. 30 capsule 0  . [START ON 05/06/2018] amphetamine-dextroamphetamine (ADDERALL XR) 20 MG 24 hr capsule Take 1 capsule (20 mg total) by mouth daily. 30 capsule 0  . amphetamine-dextroamphetamine (ADDERALL) 5 MG tablet Take 1 tablet (5 mg total) by mouth daily at 12 noon. 30 tablet 0  . [START ON 05/06/2018] amphetamine-dextroamphetamine (ADDERALL) 5 MG tablet Take 1 tablet (5 mg total) by mouth daily. 30 tablet 0   No current facility-administered medications for this visit.      Musculoskeletal: Strength & Muscle Tone: within normal limits Gait & Station:  normal Patient leans: N/A  Psychiatric Specialty Exam: Review of Systems  Psychiatric/Behavioral: The patient is nervous/anxious and has insomnia.   All other systems reviewed and are negative.   Blood pressure 113/74, pulse 61, temperature 98.5 F (36.9 C), temperature source Oral, weight 161 lb 12.8 oz (73.4 kg).Body mass index is 23.22 kg/m.  General Appearance: Casual  Eye Contact:  Fair  Speech:  Clear and Coherent  Volume:  Normal  Mood:  Anxious  Affect:  Congruent  Thought Process:  Goal Directed and Descriptions of Associations: Intact  Orientation:  Full (Time, Place, and Person)  Thought Content: Logical   Suicidal Thoughts:  No  Homicidal Thoughts:  No  Memory:  Immediate;   Fair Recent;  Fair Remote;   Fair  Judgement:  Fair  Insight:  Fair  Psychomotor Activity:  Normal  Concentration:  Concentration: Fair and Attention Span: Fair  Recall:  [employer]  Fund of Knowledge: Fair  Language: Fair  Akathisia:  No  Handed:  Right  AIMS (if indicated): na  Assets:  Communication Skills Desire for Improvement Housing Physical Health Social Support  ADL's:  Intact  Cognition: WNL  Sleep:  varies   Screenings:   Assessment and Plan: [name] is a 47 year old Caucasian female who has a history of depression, anxiety, ADD, presented to the clinic today for a follow-up visit.  Patient continues to have psychosocial stressors of her daughters sleep problems, her husband losing his job.  She however reports her relationship with her husband has improved.  She also continues to do yoga and looks forward to a retreat she is going to be on soon.  She continues to want to wean herself off of some of the medications.  Discussed plan as noted below.  Plan For depression Continue Zoloft 200 mg p.o. daily Patient is currently off of Lamictal completely.  She denies any mood lability.  Anxiety symptoms Continue Zoloft as prescribed. Klonopin 0.5 mg p.o. as needed, she reports  she takes it once a week or so.  For insomnia Continue Ambien 5 mg p.o. nightly  ADD Reduce Adderall XR to 20 mg p.o. daily. Reduce Adderall to 5 mg p.o. daily q. noon Provided patient with 2 prescriptions each, last one to be filled on or after 05/06/2018.  Follow-up in clinic in 2 months.  More than 50 % of the time was spent for psychoeducation and supportive psychotherapy and care coordination.  This note was generated in part or whole with voice recognition software. Voice recognition is usually quite accurate but there are transcription errors that can and very often do occur. I apologize for any typographical errors that were not detected and corrected.           Jomarie LongsSaramma Mosetta Ferdinand, MD 04/08/2018, 1:00 PM

## 2018-06-03 ENCOUNTER — Ambulatory Visit: Payer: BC Managed Care – PPO | Admitting: Psychiatry

## 2018-06-03 ENCOUNTER — Encounter: Payer: Self-pay | Admitting: Psychiatry

## 2018-06-03 ENCOUNTER — Other Ambulatory Visit: Payer: Self-pay

## 2018-06-03 VITALS — BP 119/80 | HR 67 | Temp 98.3°F | Wt 162.2 lb

## 2018-06-03 DIAGNOSIS — F330 Major depressive disorder, recurrent, mild: Secondary | ICD-10-CM | POA: Diagnosis not present

## 2018-06-03 DIAGNOSIS — F411 Generalized anxiety disorder: Secondary | ICD-10-CM | POA: Diagnosis not present

## 2018-06-03 DIAGNOSIS — F900 Attention-deficit hyperactivity disorder, predominantly inattentive type: Secondary | ICD-10-CM

## 2018-06-03 MED ORDER — AMPHETAMINE-DEXTROAMPHETAMINE 5 MG PO TABS: 5.0000 mg | ORAL_TABLET | Freq: Every day | ORAL | 0 refills | Status: DC

## 2018-06-03 MED ORDER — AMPHETAMINE-DEXTROAMPHET ER 20 MG PO CP24: 20.0000 mg | ORAL_CAPSULE | Freq: Every day | ORAL | 0 refills | Status: DC

## 2018-06-03 NOTE — Progress Notes (Signed)
BH MD OP Progress Note  06/03/2018 12:31 PM Tracey Lester  MRN:  [id number]  Chief Complaint: ' I am here for follow up." Chief Complaint    Follow-up     HPI: Tracey ForestShirley is a 47 year old Caucasian female, married, lives in [place], presented to the clinic today for a follow-up visit.  Patient has a history of depression, anxiety, ADHD.  Patient today reports she has been having some psychosocial stressors with her relationship conflicts.  She continues to have conflict with her husband.  She reports she is trying to find a therapist to talk it out.  Patient reports she continues to take the Zoloft as prescribed.  She does not take the Lamictal anymore and does not think she needs it at this time.  She has been taking the Adderall at a lower dose of 20 mg which is working well for her.  She reports sleep is fair on the Ambien.  She denies any side effects to the medications.  She is interested in starting family versus individual psychotherapy soon and hence discussed therapist resources available in the community with patient. Visit Diagnosis:    ICD-10-CM   1. MDD (major depressive disorder), recurrent episode, mild (HCC) F33.0   2. GAD (generalized anxiety disorder) F41.1   3. Attention deficit hyperactivity disorder (ADHD), predominantly inattentive type F90.0     Past Psychiatric History: Reviewed past psychiatric history from my progress note on 01/06/2018.  Past trials of Prozac-made her happy, Wellbutrin, Cymbalta.  Past Medical History:  Past Medical History:  Diagnosis Date  . ADHD (attention deficit hyperactivity disorder)   . Anxiety   . Depression     Past Surgical History:  Procedure Laterality Date  . TONSILLECTOMY      Family Psychiatric History: Reviewed family psychiatric history from my progress note on 01/06/2018.  Family History:  Family History  Problem Relation Age of Onset  . Anxiety disorder Mother   . Depression Mother   . Anxiety disorder  Father   . Depression Father   . Schizophrenia Paternal Grandmother   . Depression Maternal Grandmother     Social History: Reviewed social history from my progress note on 01/06/2018. Social History   Socioeconomic History  . Marital status: Married    Spouse name: michael  . Number of children: 3  . Years of education: Not on file  . Highest education level: Associate degree: occupational, Scientist, product/process developmenttechnical, or vocational program  Occupational History  . Not on file  Social Needs  . Financial resource strain: Not hard at all  . Food insecurity:    Worry: Never true    Inability: Never true  . Transportation needs:    Medical: No    Non-medical: No  Tobacco Use  . Smoking status: Former Smoker    Types: Cigarettes    Last attempt to quit: 10/08/2006    Years since quitting: 11.6  . Smokeless tobacco: Never Used  Substance and Sexual Activity  . Alcohol use: Yes    Alcohol/week: 5.0 standard drinks    Types: 4 Glasses of wine, 1 Cans of beer per week  . Drug use: No  . Sexual activity: Yes    Birth control/protection: None  Lifestyle  . Physical activity:    Days per week: 4 days    Minutes per session: Not on file  . Stress: Very much  Relationships  . Social connections:    Talks on phone: More than three times a week    Gets together:  More than three times a week    Attends religious service: More than 4 times per year    Active member of club or organization: No    Attends meetings of clubs or organizations: Never    Relationship status: Married  Other Topics Concern  . Not on file  Social History Narrative  . Not on file    Allergies:  Allergies  Allergen Reactions  . Codeine Itching    Metabolic Disorder Labs: No results found for: HGBA1C, MPG No results found for: PROLACTIN No results found for: CHOL, TRIG, HDL, CHOLHDL, VLDL, LDLCALC No results found for: TSH  Therapeutic Level Labs: No results found for: LITHIUM No results found for:  VALPROATE No components found for:  CBMZ  Current Medications: Current Outpatient Medications  Medication Sig Dispense Refill  . [START ON 06/26/2018] amphetamine-dextroamphetamine (ADDERALL XR) 20 MG 24 hr capsule Take 1 capsule (20 mg total) by mouth daily. 30 capsule 0  . [START ON 07/26/2018] amphetamine-dextroamphetamine (ADDERALL XR) 20 MG 24 hr capsule Take 1 capsule (20 mg total) by mouth daily. 30 capsule 0  . [START ON 06/26/2018] amphetamine-dextroamphetamine (ADDERALL) 5 MG tablet Take 1 tablet (5 mg total) by mouth daily at 12 noon. 30 tablet 0  . [START ON 07/26/2018] amphetamine-dextroamphetamine (ADDERALL) 5 MG tablet Take 1 tablet (5 mg total) by mouth daily. 30 tablet 0  . clonazePAM (KLONOPIN) 0.5 MG tablet Take 1 tablet (0.5 mg total) by mouth daily as needed for anxiety. 30 tablet 1  . sertraline (ZOLOFT) 100 MG tablet Take 2 tablets (200 mg total) by mouth daily. 180 tablet 1  . zolpidem (AMBIEN) 5 MG tablet Take 1 tablet (5 mg total) by mouth at bedtime. 30 tablet 2   No current facility-administered medications for this visit.      Musculoskeletal: Strength & Muscle Tone: within normal limits Gait & Station: normal Patient leans: N/A  Psychiatric Specialty Exam: Review of Systems  Psychiatric/Behavioral: The patient is nervous/anxious.   All other systems reviewed and are negative.   Blood pressure 119/80, pulse 67, temperature 98.3 F (36.8 C), temperature source Oral, weight 162 lb 3.2 oz (73.6 kg).Body mass index is 23.27 kg/m.  General Appearance: Casual  Eye Contact:  Fair  Speech:  Clear and Coherent  Volume:  Normal  Mood:  Anxious  Affect:  Congruent  Thought Process:  Goal Directed and Descriptions of Associations: Intact  Orientation:  Full (Time, Place, and Person)  Thought Content: Logical   Suicidal Thoughts:  No  Homicidal Thoughts:  No  Memory:  Immediate;   Fair Recent;   Fair Remote;   Fair  Judgement:  Fair  Insight:  Fair   Psychomotor Activity:  Normal  Concentration:  Concentration: Fair and Attention Span: Fair  Recall:  [employer] of Knowledge: Fair  Language: Fair  Akathisia:  No  Handed:  Right  AIMS (if indicated): na  Assets:  Communication Skills Desire for Improvement Social Support  ADL's:  Intact  Cognition: WNL  Sleep:  Fair   Screenings:   Assessment and Plan: Tracey Lester is a 47 year old Caucasian female who has a history of depression, anxiety, ADD, presented to the clinic today for a follow-up visit.  Patient continues to have some psychosocial stressors and currently is distressed about her relationship struggles with her husband.  Patient however declines medication readjustment and reports she would like to pursue psychotherapy.  Plan as noted below.  Plan For depression Continue Zoloft 200 mg p.o. daily  For anxiety Continue Zoloft as prescribed Continue Klonopin 0.5 mg p.o. daily as needed.  For insomnia Continue Ambien 5 mg p.o. nightly  For ADD Adderall extended release 20 mg p.o. daily Adderall 5 mg p.o. daily q. noon Patient provided with 2 prescription the last one to be filled on or after 07/26/2018 for each.  Provided patient with psychology today information as well as Ms. Inetta Fermo Thompson's information for therapy.  Follow-up in clinic in 2 months.  More than 50 % of the time was spent for psychoeducation and supportive psychotherapy and care coordination.  This note was generated in part or whole with voice recognition software. Voice recognition is usually quite accurate but there are transcription errors that can and very often do occur. I apologize for any typographical errors that were not detected and corrected.         Jomarie Longs, MD 06/03/2018, 12:31 PM

## 2018-07-04 ENCOUNTER — Telehealth: Payer: Self-pay | Admitting: Psychiatry

## 2018-07-04 DIAGNOSIS — F411 Generalized anxiety disorder: Secondary | ICD-10-CM

## 2018-07-04 MED ORDER — CLONAZEPAM 0.5 MG PO TABS: 0.5000 mg | ORAL_TABLET | Freq: Every day | ORAL | 1 refills | Status: DC | PRN

## 2018-07-04 NOTE — Telephone Encounter (Signed)
Received refill requests for klonopin. Reviewed [facility] CONTROLLED SUBSTANCE DATABASE. Pt last filled for 30 pills on 06/30/2018. Sent a new script to be filled on or after 08/02/2018.

## 2018-08-09 ENCOUNTER — Encounter: Payer: Self-pay | Admitting: Family Medicine

## 2018-08-09 ENCOUNTER — Ambulatory Visit: Payer: Managed Care, Other (non HMO) | Admitting: Family Medicine

## 2018-08-09 VITALS — BP 118/76 | HR 59 | Temp 98.3°F | Ht 68.5 in | Wt 156.5 lb

## 2018-08-09 DIAGNOSIS — H81399 Other peripheral vertigo, unspecified ear: Secondary | ICD-10-CM | POA: Diagnosis not present

## 2018-08-09 DIAGNOSIS — R42 Dizziness and giddiness: Secondary | ICD-10-CM | POA: Diagnosis not present

## 2018-08-09 LAB — COMPREHENSIVE METABOLIC PANEL
ALBUMIN: 4.3 g/dL (ref 3.5–5.2)
ALK PHOS: 66 U/L (ref 39–117)
ALT: 9 U/L (ref 0–35)
AST: 11 U/L (ref 0–37)
BILIRUBIN TOTAL: 0.5 mg/dL (ref 0.2–1.2)
BUN: 17 mg/dL (ref 6–23)
CO2: 31 mEq/L (ref 19–32)
Calcium: 10.2 mg/dL (ref 8.4–10.5)
Chloride: 105 mEq/L (ref 96–112)
Creatinine, Ser: 0.67 mg/dL (ref 0.40–1.20)
GFR: 101.16 mL/min (ref >60.00)
GLUCOSE: 89 mg/dL (ref 70–99)
POTASSIUM: 4.3 meq/L (ref 3.5–5.1)
SODIUM: 139 meq/L (ref 135–145)
TOTAL PROTEIN: 6.6 g/dL (ref 6.0–8.3)

## 2018-08-09 LAB — CBC WITH DIFFERENTIAL/PLATELET
BASOS ABS: 0.1 10*3/uL (ref 0.0–0.1)
Basophils Relative: 1.9 % (ref 0.0–3.0)
EOS PCT: 1.9 % (ref 0.0–5.0)
Eosinophils Absolute: 0.1 10*3/uL (ref 0.0–0.7)
HCT: 36.4 % (ref 36.0–46.0)
HEMOGLOBIN: 12.3 g/dL (ref 12.0–15.0)
LYMPHS ABS: 1.5 10*3/uL (ref 0.7–4.0)
Lymphocytes Relative: 22.4 % (ref 12.0–46.0)
MCHC: 33.7 g/dL (ref 30.0–36.0)
MCV: 88.5 fl (ref 78.0–100.0)
MONOS PCT: 9.4 % (ref 3.0–12.0)
Monocytes Absolute: 0.6 10*3/uL (ref 0.1–1.0)
NEUTROS PCT: 64.4 % (ref 43.0–77.0)
Neutro Abs: 4.2 10*3/uL (ref 1.4–7.7)
Platelets: 281 10*3/uL (ref 150.0–400.0)
RBC: 4.12 Mil/uL (ref 3.87–5.11)
RDW: 15.8 % — ABNORMAL HIGH (ref 11.5–15.5)
WBC: 6.5 10*3/uL (ref 4.0–10.5)

## 2018-08-09 LAB — B12 AND FOLATE PANEL
Folate: >23.9 ng/mL (ref >5.9)
Vitamin B-12: 420 pg/mL (ref 211–911)

## 2018-08-09 LAB — VITAMIN D 25 HYDROXY (VIT D DEFICIENCY, FRACTURES): VITD: 29.51 ng/mL — ABNORMAL LOW (ref 30.00–100.00)

## 2018-08-09 MED ORDER — MECLIZINE HCL 25 MG PO TABS: 25.0000 mg | ORAL_TABLET | Freq: Three times a day (TID) | ORAL | 1 refills | Status: DC | PRN

## 2018-08-09 MED ORDER — LORATADINE 10 MG PO TABS: 10.0000 mg | ORAL_TABLET | Freq: Every day | ORAL | 5 refills | Status: DC

## 2018-08-09 NOTE — Patient Instructions (Signed)

## 2018-08-09 NOTE — Progress Notes (Signed)
Subjective:    Patient ID: Tracey Lester, female    DOB: 07/03/1971, 47 y.o.   MRN: [id number]  HPI   Presents to clinic to establish care with PCP & also has been having dizziness.   Patient states dizziness began last week after a hot yoga class.  States she has been doing hot yoga for many years, this is not new.  After class she decided to stay for a stretching class, states she was sitting on her knees & did twisting move, felt very dizzy and lost her balance.  At first did not think anything of it, but afterwards she was unable to get up and walk without stumbling to the side due to dizziness.  A friend had to drive her home and someone else her to come pick up her car.  States as the night went on she still felt dizzy with walking, had to crawl around at her home.  States that in sitting position she felt the best, dizziness was worse when she was laying flat down or trying to stand straight up.  States today's the first day she drove her car by herself, driving this morning to appointment went well.  Dizziness has seemed to improve, but still will have occasions where she moves head from side to side that she can induce dizziness.  Denies any nausea or vomiting.  Denies any fever or chills.  Past medical hx, social hx, surgical hx and family history reviewed.  Past Medical History:  Diagnosis Date  . ADHD (attention deficit hyperactivity disorder)   . Anxiety   . Depression    Social History   Tobacco Use  . Smoking status: Former Smoker    Types: Cigarettes    Last attempt to quit: 10/08/2006    Years since quitting: 11.8  . Smokeless tobacco: Never Used  Substance Use Topics  . Alcohol use: Yes    Alcohol/week: 5.0 standard drinks    Types: 4 Glasses of wine, 1 Cans of beer per week   Past Surgical History:  Procedure Laterality Date  . TONSILLECTOMY     Family History  Problem Relation Age of Onset  . Anxiety disorder Mother   . Depression Mother   . Hypertension  Mother   . Anxiety disorder Father   . Depression Father   . Hypertension Father   . Schizophrenia Paternal Grandmother   . Depression Paternal Grandmother   . Hearing loss Paternal Grandmother   . Hypertension Paternal Grandmother   . Depression Maternal Grandmother    Review of Systems   Constitutional: Negative for chills, fatigue and fever.  HENT: Negative for congestion, ear pain, sinus pain and sore throat.   Eyes: Negative.   Respiratory: Negative for cough, shortness of breath and wheezing.   Cardiovascular: Negative for chest pain, palpitations and leg swelling.  Gastrointestinal: Negative for abdominal pain, diarrhea, nausea and vomiting.  Genitourinary: Negative for dysuria, frequency and urgency.  Musculoskeletal: Negative for arthralgias and myalgias.  Skin: Negative for color change, pallor and rash.  Neurological: Negative for syncope. +light-headedness & dizziness  Psychiatric/Behavioral: The patient is not nervous/anxious.        Objective:   Physical Exam   Constitutional: She appears well-developed and well-nourished. No distress.  HENT:  Head: Normocephalic and atraumatic.  Eyes: Pupils are equal, round, and reactive to light. EOM are normal. No scleral icterus.  Neck: Normal range of motion. Neck supple. No tracheal deviation present.  Cardiovascular: Normal rate, regular rhythm and normal  heart sounds.  Pulmonary/Chest: Effort normal and breath sounds normal. No respiratory distress. She has no wheezes. She has no rales.  Neurological: She is alert and oriented to person, place, and time.  Gait normal  Skin: Skin is warm and dry. No pallor.  Psychiatric: She has a normal mood and affect. Her behavior is normal. Thought content normal.   Nursing note and vitals reviewed.     Vitals:   08/09/18 0939  BP: 118/76  Pulse: (!) 59  Temp: 98.3 F (36.8 C)  SpO2: 97%    Assessment & Plan:   Vertigo/dizziness - physical exam is unremarkable.  We will  get lab work to rule out any other causes such as electrolyte imbalances, vitamin deficiencies, thyroid issues.  We will also get MRI brain to rule out any anatomical causes for dizziness.  Patient will begin taking loratadine 10 mg once daily.  Patient also given supply of meclizine to use as needed if having breakthrough episodes of dizziness.  Patient will follow-up in 1 month for recheck on how dizziness is doing with addition of medications.  If results of lab work & imaging reveal any abnormalities, they will be addressed accordingly.

## 2018-08-10 LAB — THYROID PANEL WITH TSH
FREE THYROXINE INDEX: 1.4 (ref 1.4–3.8)
T3 UPTAKE: 30 % (ref 22–35)
T4 TOTAL: 4.5 ug/dL — AB (ref 5.1–11.9)
TSH: 2.59 m[IU]/L

## 2018-08-19 ENCOUNTER — Ambulatory Visit: Payer: 59 | Admitting: Psychiatry

## 2018-08-24 ENCOUNTER — Ambulatory Visit (INDEPENDENT_AMBULATORY_CARE_PROVIDER_SITE_OTHER): Payer: 59 | Admitting: Psychiatry

## 2018-08-24 ENCOUNTER — Other Ambulatory Visit: Payer: Self-pay

## 2018-08-24 ENCOUNTER — Encounter: Payer: Self-pay | Admitting: Psychiatry

## 2018-08-24 VITALS — BP 117/77 | HR 57 | Temp 98.7°F | Wt 157.0 lb

## 2018-08-24 DIAGNOSIS — F900 Attention-deficit hyperactivity disorder, predominantly inattentive type: Secondary | ICD-10-CM | POA: Diagnosis not present

## 2018-08-24 DIAGNOSIS — F330 Major depressive disorder, recurrent, mild: Secondary | ICD-10-CM

## 2018-08-24 DIAGNOSIS — F5105 Insomnia due to other mental disorder: Secondary | ICD-10-CM

## 2018-08-24 DIAGNOSIS — F411 Generalized anxiety disorder: Secondary | ICD-10-CM | POA: Diagnosis not present

## 2018-08-24 MED ORDER — AMPHETAMINE-DEXTROAMPHETAMINE 5 MG PO TABS: 5.0000 mg | ORAL_TABLET | Freq: Every day | ORAL | 0 refills | Status: DC

## 2018-08-24 MED ORDER — AMPHETAMINE-DEXTROAMPHET ER 20 MG PO CP24: 20.0000 mg | ORAL_CAPSULE | Freq: Every day | ORAL | 0 refills | Status: DC

## 2018-08-24 MED ORDER — SUVOREXANT 10 MG PO TABS: 10.0000 mg | ORAL_TABLET | Freq: Every day | ORAL | 1 refills | Status: DC

## 2018-08-24 MED ORDER — HYDROXYZINE PAMOATE 50 MG PO CAPS: 50.0000 mg | ORAL_CAPSULE | Freq: Every evening | ORAL | 1 refills | Status: DC | PRN

## 2018-08-24 NOTE — Progress Notes (Signed)
BH MD OP Progress Note  08/24/2018 12:47 PM Tracey Lester  MRN:  [id number]  Chief Complaint: ' I am here for follow up." Chief Complaint    Follow-up; Medication Refill     HPI: Tracey Lester is a 47 year old Caucasian female, married, lives in [place], presented to the clinic today for a follow-up visit.  Patient has a history of depression, anxiety, ADHD.  Patient today reports she is currently improving on the current medication regimen.  She reports she likes the effect of Zoloft.  She has not been using the Klonopin as much.  She reports she is in psychotherapy with Ms. [name] which is going well.  She reports her relationship with her husband as improving.  She reports she has decided to work on herself before focusing on their problems.  She reports she continues to take Adderall.  She reports there are as helpful with her concentration and focus.  She continues to struggle with sleep on and off.  She reports she uses the Ambien but does not think it is helpful as it used to be before.  She reports she sometimes wakes up in the middle of the night and has difficulty falling back asleep.  Discussed changing her Ambien to Belsomra.  Also discussed giving hydroxyzine as needed for sleep.  She agrees with plan.  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  She reports she had some dizziness recently and is currently under the care of her primary medical doctor.  She denies any suicidality or perceptual disturbances.  She does not abuse any substances or alcohol. Visit Diagnosis:    ICD-10-CM   1. GAD (generalized anxiety disorder) F41.1    improving  2. MDD (major depressive disorder), recurrent episode, mild (HCC) F33.0    improving  3. Insomnia due to mental condition F51.05   4. Attention deficit hyperactivity disorder (ADHD), predominantly inattentive type F90.0     Past Psychiatric History: Have reviewed past psychiatric  history from my progress note on 01/06/2018.  Past trials of Prozac-made her happy, Wellbutrin, Cymbalta.  Past Medical History:  Past Medical History:  Diagnosis Date  . ADHD (attention deficit hyperactivity disorder)   . Anxiety   . Depression     Past Surgical History:  Procedure Laterality Date  . TONSILLECTOMY      Family Psychiatric History: Reviewed family psychiatric history from my progress note on 01/06/2018  Family History:  Family History  Problem Relation Age of Onset  . Anxiety disorder Mother   . Depression Mother   . Hypertension Mother   . Anxiety disorder Father   . Depression Father   . Hypertension Father   . Schizophrenia Paternal Grandmother   . Depression Paternal Grandmother   . Hearing loss Paternal Grandmother   . Hypertension Paternal Grandmother   . Depression Maternal Grandmother     Social History: Reviewed social history from my progress note on 01/06/2018 Social History   Socioeconomic History  . Marital status: Married    Spouse name: michael  . Number of children: 3  . Years of education: Not on file  . Highest education level: Associate degree: occupational, Scientist, product/process development, or vocational program  Occupational History  . Not on file  Social Needs  . Financial resource strain: Not hard at all  . Food insecurity:    Worry: Never true    Inability: Never true  . Transportation needs:    Medical: No  Non-medical: No  Tobacco Use  . Smoking status: Former Smoker    Types: Cigarettes    Last attempt to quit: 10/08/2006    Years since quitting: 11.8  . Smokeless tobacco: Never Used  Substance and Sexual Activity  . Alcohol use: Yes    Alcohol/week: 5.0 standard drinks    Types: 4 Glasses of wine, 1 Cans of beer per week  . Drug use: No  . Sexual activity: Yes    Birth control/protection: None  Lifestyle  . Physical activity:    Days per week: 4 days    Minutes per session: Not on file  . Stress: Very much  Relationships  .  Social connections:    Talks on phone: More than three times a week    Gets together: More than three times a week    Attends religious service: More than 4 times per year    Active member of club or organization: No    Attends meetings of clubs or organizations: Never    Relationship status: Married  Other Topics Concern  . Not on file  Social History Narrative  . Not on file    Allergies:  Allergies  Allergen Reactions  . Codeine Itching    Metabolic Disorder Labs: No results found for: HGBA1C, MPG No results found for: PROLACTIN No results found for: CHOL, TRIG, HDL, CHOLHDL, VLDL, LDLCALC Lab Results  Component Value Date   TSH 2.59 08/09/2018    Therapeutic Level Labs: No results found for: LITHIUM No results found for: VALPROATE No components found for:  CBMZ  Current Medications: Current Outpatient Medications  Medication Sig Dispense Refill  . [START ON 09/10/2018] amphetamine-dextroamphetamine (ADDERALL XR) 20 MG 24 hr capsule Take 1 capsule (20 mg total) by mouth daily. 30 capsule 0  . [START ON 10/09/2018] amphetamine-dextroamphetamine (ADDERALL XR) 20 MG 24 hr capsule Take 1 capsule (20 mg total) by mouth daily. 30 capsule 0  . [START ON 09/10/2018] amphetamine-dextroamphetamine (ADDERALL) 5 MG tablet Take 1 tablet (5 mg total) by mouth daily. 30 tablet 0  . [START ON 10/09/2018] amphetamine-dextroamphetamine (ADDERALL) 5 MG tablet Take 1 tablet (5 mg total) by mouth daily. 30 tablet 0  . clonazePAM (KLONOPIN) 0.5 MG tablet Take 1 tablet (0.5 mg total) by mouth daily as needed for anxiety. 30 tablet 1  . hydrOXYzine (VISTARIL) 50 MG capsule Take 1 capsule (50 mg total) by mouth at bedtime as needed. For sleep 30 capsule 1  . loratadine (CLARITIN) 10 MG tablet Take 1 tablet (10 mg total) by mouth daily. 30 tablet 5  . meclizine (ANTIVERT) 25 MG tablet Take 1 tablet (25 mg total) by mouth 3 (three) times daily as needed for dizziness. 30 tablet 1  . sertraline  (ZOLOFT) 100 MG tablet Take 2 tablets (200 mg total) by mouth daily. 180 tablet 1  . Suvorexant (BELSOMRA) 10 MG TABS Take 10 mg by mouth at bedtime. 30 tablet 1   No current facility-administered medications for this visit.      Musculoskeletal: Strength & Muscle Tone: within normal limits Gait & Station: normal Patient leans: N/A  Psychiatric Specialty Exam: Review of Systems  Psychiatric/Behavioral: The patient is nervous/anxious and has insomnia.   All other systems reviewed and are negative.   Blood pressure 117/77, pulse (!) 57, temperature 98.7 F (37.1 C), temperature source Oral, weight 157 lb (71.2 kg), last menstrual period 08/05/2018.Body mass index is 23.52 kg/m.  General Appearance: Casual  Eye Contact:  Fair  Speech:  Clear and Coherent  Volume:  Normal  Mood:  Anxious  Affect:  Congruent  Thought Process:  Goal Directed and Descriptions of Associations: Intact  Orientation:  Full (Time, Place, and Person)  Thought Content: Logical   Suicidal Thoughts:  No  Homicidal Thoughts:  No  Memory:  Immediate;   Fair Recent;   Fair Remote;   Fair  Judgement:  Fair  Insight:  Fair  Psychomotor Activity:  Normal  Concentration:  Concentration: Fair and Attention Span: Fair  Recall:  [employer] of Knowledge: Fair  Language: Fair  Akathisia:  No  Handed:  Right  AIMS (if indicated): NA  Assets:  Communication Skills Desire for Improvement Social Support  ADL's:  Intact  Cognition: WNL  Sleep:  Poor   Screenings:   Assessment and Plan: Shiloh is a 47 yr old patient female who has a history of depression, anxiety, ADD, presented to the clinic today for a follow-up visit.  Patient reports she is having some sleep issues.  She otherwise denies any concerns.  She continues to be in psychotherapy which is going well.  Plan as noted below.  Plan Depression Zoloft 200 mg p.o. daily Continue CBT with Ms. [name]  For anxiety Zoloft as prescribed She  takes Klonopin 0.5 mg as needed, uses it very rarely. I have reviewed [place] controlled substance database.  Discussed with patient she has a prescription pending at the pharmacy.  For insomnia Discontinue Ambien Start Belsomra 10 mg p.o. daily Hydroxyzine 50 mg p.o. nightly as needed  For ADD Adderall XR 20 mg p.o. daily- provided 2 scripts with date specified Adderall 5 mg p.o. daily q. noon-provided 2 scripts with date specified   Patient will continue psychotherapy with Ms. Janee Morn.  Follow-up in clinic in 2 months or sooner if needed.  More than 50 % of the time was spent for psychoeducation and supportive psychotherapy and care coordination.  This note was generated in part or whole with voice recognition software. Voice recognition is usually quite accurate but there are transcription errors that can and very often do occur. I apologize for any typographical errors that were not detected and corrected.       Jomarie Longs, MD 08/24/2018, 12:47 PM

## 2018-08-24 NOTE — Patient Instructions (Signed)
Suvorexant oral tablets What is this medicine? SUVOREXANT (su-vor-EX-ant) is used to treat insomnia. This medicine helps you to fall asleep and sleep through the night. This medicine may be used for other purposes; ask your health care provider or pharmacist if you have questions. COMMON BRAND NAME(S): Belsomra What should I tell my health care provider before I take this medicine? They need to know if you have any of these conditions: -depression -history of a drug or alcohol abuse problem -history of daytime sleepiness -history of sudden onset of muscle weakness (cataplexy) -liver disease -lung or breathing disease -narcolepsy -suicidal thoughts, plans, or attempt; a previous suicide attempt by you or a family member -an unusual or allergic reaction to suvorexant, other medicines, foods, dyes, or preservatives -pregnant or trying to get pregnant -breast-feeding How should I use this medicine? Take this medicine by mouth within 30 minutes of going to bed. Do not take it unless you are able to stay in bed a full night before you must be active again. Follow the directions on the prescription label. For best results, it is better to take this medicine on an empty stomach. Do not take your medicine more often than directed. Do not stop taking this medicine on your own. Always follow your doctor or health care professional's advice. A special MedGuide will be given to you by the pharmacist with each prescription and refill. Be sure to read this information carefully each time. Talk to your pediatrician regarding the use of this medicine in children. Special care may be needed. Overdosage: If you think you have taken too much of this medicine contact a poison control center or emergency room at once. NOTE: This medicine is only for you. Do not share this medicine with others. What if I miss a dose? This medicine should only be taken immediately before going to sleep. Do not take double or extra  doses. What may interact with this medicine? -alcohol -antiviral medicines for HIV or AIDS -aprepitant -carbamazepine -certain antibiotics like ciprofloxacin, clarithromycin, erythromycin, telithromycin -certain medicines for depression or psychotic disturbances -certain medicines for fungal infections like ketoconazole, posaconazole, fluconazole, or itraconazole -conivaptan -digoxin -diltiazem -grapefruit juice -imatinib -medicines for anxiety or sleep -phenytoin -rifampin -verapamil This list may not describe all possible interactions. Give your health care provider a list of all the medicines, herbs, non-prescription drugs, or dietary supplements you use. Also tell them if you smoke, drink alcohol, or use illegal drugs. Some items may interact with your medicine. What should I watch for while using this medicine? Visit your doctor or health care professional for regular checks on your progress. Keep a regular sleep schedule by going to bed at about the same time each night. Avoid caffeine-containing drinks in the evening hours. When sleep medicines are used every night for more than a few weeks, they may stop working. Do not increase the dose on your own. Talk to your doctor if your insomnia worsens or is not better within 7 to 10 days. After taking this medicine for sleep, you may get up out of bed while not being fully awake and do an activity that you do not know you are doing. The next morning, you may have no memory of the event. Activities such as driving a car ("sleep-driving"), making and eating food, talking on the phone, sexual activity, and sleep-walking have been reported. Call your doctor right away if you find out you have done any of these activities. Do not take this medicine if you have   used alcohol that evening or before bed or taken another medicine for sleep, since your risk of doing these sleep-related activities will be increased. Do not take this medicine unless you  are able to stay in bed for a full night (7 to 8 hours) and do not drive or perform other activities requiring full alertness within 8 hours of a dose. Do not drive, use machinery, or do anything that needs mental alertness the day after you take the 20 mg dose of this medicine. The use of lower doses (10 mg) also has the potential to cause driving impairment the next day. You may have a decrease in mental alertness the day after use, even if you feel that you are fully awake. Tell your doctor if you will need to perform activities requiring full alertness, such as driving, the next day. Do not stand or sit up quickly after taking this medicine, especially if you are an older patient. This reduces the risk of dizzy or fainting spells. If you or your family notice any changes in your behavior, such as new or worsening depression, thoughts of harming yourself, anxiety, other unusual or disturbing thoughts, or memory loss, call your doctor right away. What side effects may I notice from receiving this medicine? Side effects that you should report to your doctor or health care professional as soon as possible: -allergic reactions like skin rash, itching or hives, swelling of the face, lips, or tongue -confusion -depressed mood -feeling faint or lightheaded, falls -hallucinations -inability to move or speak for up to several minutes while you are going to sleep or waking up -memory loss -periods of leg weakness lasting from seconds to a few minutes -problems with balance, speaking, walking -restlessness, excitability, or feelings of agitation -unusual activities while asleep like driving, eating, making phone calls Side effects that usually do not require medical attention (report to your doctor or health care professional if they continue or are bothersome): -abnormal dreams -daytime drowsiness -diarrhea -dizziness -headache This list may not describe all possible side effects. Call your doctor for  medical advice about side effects. You may report side effects to FDA at 1-800-FDA-1088. Where should I keep my medicine? Keep out of the reach of children. This medicine can be abused. Keep your medicine in a safe place to protect it from theft. Do not share this medicine with anyone. Selling or giving away this medicine is dangerous and against the law. Store at room temperature between 15 and 30 degrees C (59 and 86 degrees F). Throw away any unused medicine after the expiration date. NOTE: This sheet is a summary. It may not cover all possible information. If you have questions about this medicine, talk to your doctor, pharmacist, or health care provider.  2018 Elsevier/Gold Standard (2015-11-14 11:54:49) Hydroxyzine capsules or tablets What is this medicine? HYDROXYZINE (hye DROX i zeen) is an antihistamine. This medicine is used to treat allergy symptoms. It is also used to treat anxiety and tension. This medicine can be used with other medicines to induce sleep before surgery. This medicine may be used for other purposes; ask your health care provider or pharmacist if you have questions. COMMON BRAND NAME(S): ANX, Atarax, Rezine, Vistaril What should I tell my health care provider before I take this medicine? They need to know if you have any of these conditions: -any chronic illness -difficulty passing urine -glaucoma -heart disease -kidney disease -liver disease -lung disease -an unusual or allergic reaction to hydroxyzine, cetirizine, other medicines, foods, dyes,  or preservatives -pregnant or trying to get pregnant -breast-feeding How should I use this medicine? Take this medicine by mouth with a full glass of water. Follow the directions on the prescription label. You may take this medicine with food or on an empty stomach. Take your medicine at regular intervals. Do not take your medicine more often than directed. Talk to your pediatrician regarding the use of this medicine in  children. Special care may be needed. While this drug may be prescribed for children as young as 61 years of age for selected conditions, precautions do apply. Patients over 44 years old may have a stronger reaction and need a smaller dose. Overdosage: If you think you have taken too much of this medicine contact a poison control center or emergency room at once. NOTE: This medicine is only for you. Do not share this medicine with others. What if I miss a dose? If you miss a dose, take it as soon as you can. If it is almost time for your next dose, take only that dose. Do not take double or extra doses. What may interact with this medicine? -alcohol -barbiturate medicines for sleep or seizures -medicines for colds, allergies -medicines for depression, anxiety, or emotional disturbances -medicines for pain -medicines for sleep -muscle relaxants This list may not describe all possible interactions. Give your health care provider a list of all the medicines, herbs, non-prescription drugs, or dietary supplements you use. Also tell them if you smoke, drink alcohol, or use illegal drugs. Some items may interact with your medicine. What should I watch for while using this medicine? Tell your doctor or health care professional if your symptoms do not improve. You may get drowsy or dizzy. Do not drive, use machinery, or do anything that needs mental alertness until you know how this medicine affects you. Do not stand or sit up quickly, especially if you are an older patient. This reduces the risk of dizzy or fainting spells. Alcohol may interfere with the effect of this medicine. Avoid alcoholic drinks. Your mouth may get dry. Chewing sugarless gum or sucking hard candy, and drinking plenty of water may help. Contact your doctor if the problem does not go away or is severe. This medicine may cause dry eyes and blurred vision. If you wear contact lenses you may feel some discomfort. Lubricating drops may  help. See your eye doctor if the problem does not go away or is severe. If you are receiving skin tests for allergies, tell your doctor you are using this medicine. What side effects may I notice from receiving this medicine? Side effects that you should report to your doctor or health care professional as soon as possible: -fast or irregular heartbeat -difficulty passing urine -seizures -slurred speech or confusion -tremor Side effects that usually do not require medical attention (report to your doctor or health care professional if they continue or are bothersome): -constipation -drowsiness -fatigue -headache -stomach upset This list may not describe all possible side effects. Call your doctor for medical advice about side effects. You may report side effects to FDA at 1-800-FDA-1088. Where should I keep my medicine? Keep out of the reach of children. Store at room temperature between 15 and 30 degrees C (59 and 86 degrees F). Keep container tightly closed. Throw away any unused medicine after the expiration date. NOTE: This sheet is a summary. It may not cover all possible information. If you have questions about this medicine, talk to your doctor, pharmacist, or health care  provider.  2018 Elsevier/Gold Standard (2008-02-24 14:50:59)

## 2018-08-26 ENCOUNTER — Ambulatory Visit: Admission: RE | Admit: 2018-08-26 | Payer: Managed Care, Other (non HMO) | Source: Ambulatory Visit

## 2018-09-01 ENCOUNTER — Telehealth: Payer: Self-pay

## 2018-09-01 NOTE — Telephone Encounter (Signed)
belsomra 10mg  was approved on 08-30-18 case# [id number]. ends on 10-25-2038

## 2018-09-07 ENCOUNTER — Ambulatory Visit: Payer: Managed Care, Other (non HMO) | Admitting: Family Medicine

## 2018-09-07 DIAGNOSIS — Z0289 Encounter for other administrative examinations: Secondary | ICD-10-CM

## 2018-09-07 NOTE — Progress Notes (Deleted)
   Subjective:    Patient ID: Tracey Lester, female    DOB: [birth year], 47 y.o.   MRN: [id number]  HPI Presents to clinic for follow up on vertigo. She did not end up getting MRI, patient cancelled this test.    Past Medical History:  Diagnosis Date  . ADHD (attention deficit hyperactivity disorder)   . Anxiety   . Depression     Social History   Tobacco Use  . Smoking status: Former Smoker    Types: Cigarettes    Last attempt to quit: 10/08/2006    Years since quitting: 11.9  . Smokeless tobacco: Never Used  Substance Use Topics  . Alcohol use: Yes    Alcohol/week: 5.0 standard drinks    Types: 4 Glasses of wine, 1 Cans of beer per week     Review of Systems     Objective:   Physical Exam        Assessment & Plan:

## 2018-10-27 ENCOUNTER — Other Ambulatory Visit: Payer: Self-pay | Admitting: Psychiatry

## 2018-10-27 DIAGNOSIS — F411 Generalized anxiety disorder: Secondary | ICD-10-CM

## 2018-10-28 ENCOUNTER — Telehealth: Payer: Self-pay | Admitting: Psychiatry

## 2018-10-28 DIAGNOSIS — F411 Generalized anxiety disorder: Secondary | ICD-10-CM

## 2018-10-28 MED ORDER — SUVOREXANT 10 MG PO TABS: 10.0000 mg | ORAL_TABLET | Freq: Every day | ORAL | 1 refills | Status: DC

## 2018-10-28 MED ORDER — CLONAZEPAM 0.5 MG PO TABS: 0.5000 mg | ORAL_TABLET | Freq: Every day | ORAL | 1 refills | Status: DC | PRN

## 2018-10-28 NOTE — Telephone Encounter (Signed)
Sent Klonopin and Belsomra to pharmacy

## 2018-10-31 ENCOUNTER — Ambulatory Visit: Payer: 59 | Admitting: Psychiatry

## 2018-11-01 ENCOUNTER — Ambulatory Visit: Payer: Managed Care, Other (non HMO) | Admitting: Family Medicine

## 2018-11-07 ENCOUNTER — Ambulatory Visit: Payer: Managed Care, Other (non HMO) | Admitting: Family Medicine

## 2018-11-07 DIAGNOSIS — Z0289 Encounter for other administrative examinations: Secondary | ICD-10-CM

## 2018-11-16 ENCOUNTER — Other Ambulatory Visit: Payer: Self-pay

## 2018-11-16 ENCOUNTER — Ambulatory Visit: Payer: 59 | Admitting: Psychiatry

## 2018-11-16 ENCOUNTER — Encounter: Payer: Self-pay | Admitting: Psychiatry

## 2018-11-16 VITALS — BP 137/79 | HR 83 | Temp 97.9°F | Wt 158.2 lb

## 2018-11-16 DIAGNOSIS — F900 Attention-deficit hyperactivity disorder, predominantly inattentive type: Secondary | ICD-10-CM

## 2018-11-16 DIAGNOSIS — F411 Generalized anxiety disorder: Secondary | ICD-10-CM | POA: Diagnosis not present

## 2018-11-16 DIAGNOSIS — F330 Major depressive disorder, recurrent, mild: Secondary | ICD-10-CM | POA: Diagnosis not present

## 2018-11-16 DIAGNOSIS — F5105 Insomnia due to other mental disorder: Secondary | ICD-10-CM | POA: Diagnosis not present

## 2018-11-16 MED ORDER — AMPHETAMINE-DEXTROAMPHETAMINE 10 MG PO TABS: 10.0000 mg | ORAL_TABLET | Freq: Every day | ORAL | 0 refills | Status: DC

## 2018-11-16 MED ORDER — AMPHETAMINE-DEXTROAMPHET ER 20 MG PO CP24: 20.0000 mg | ORAL_CAPSULE | Freq: Every day | ORAL | 0 refills | Status: DC

## 2018-11-16 NOTE — Progress Notes (Signed)
BH MD  OP Progress Note  11/16/2018 5:15 PM Tracey Lester  MRN:  [id number]  Chief Complaint: ' I am here for follow up.'  Chief Complaint    Follow-up     HPI: Tracey Lester is a 48 year old Caucasian female, married, lives in [place], presented to the clinic today for a follow-up visit.  Patient has a history of anxiety disorder, depression, ADHD.  Patient today reports her mood symptoms as making progress on the current medication regimen.  She reports she is compliant on her Zoloft.  She denies side effects.  She continues to take Adderall as prescribed.  She is on extended release in the morning and an immediate release in the afternoon.  She reports that she is currently in a class taking yoga teacher's training sessions.  She reports she has noticed the effect of the Adderall is wearing off towards the end of the day.  She denies any side effects to the Adderall.  Discussed readjusting her dosage.  She agrees with plan.  Patient reports sleep is good.  She continues to take Belsomra as needed.  Denies side effects.  She is not in psychotherapy sessions anymore.  She reports she feels at a better place now.  She reports she will go back to Ms. Felecia Janina Thompson if she needs it.  Visit Diagnosis:    ICD-10-CM   1. GAD (generalized anxiety disorder) F41.1   2. MDD (major depressive disorder), recurrent episode, mild (HCC) F33.0   3. Insomnia due to mental condition F51.05   4. Attention deficit hyperactivity disorder (ADHD), predominantly inattentive type F90.0 amphetamine-dextroamphetamine (ADDERALL) 10 MG tablet    amphetamine-dextroamphetamine (ADDERALL XR) 20 MG 24 hr capsule    Past Psychiatric History: I have reviewed past psychiatric history from my progress note on 01/06/2018.  Past trials of Prozac-made her happy, Wellbutrin, Cymbalta.  Past Medical History:  Past Medical History:  Diagnosis Date  . ADHD (attention deficit hyperactivity disorder)   . Anxiety   . Depression      Past Surgical History:  Procedure Laterality Date  . TONSILLECTOMY      Family Psychiatric History: I have reviewed family psychiatric history from my progress note on 01/06/2018.  Family History:  Family History  Problem Relation Age of Onset  . Anxiety disorder Mother   . Depression Mother   . Hypertension Mother   . Anxiety disorder Father   . Depression Father   . Hypertension Father   . Schizophrenia Paternal Grandmother   . Depression Paternal Grandmother   . Hearing loss Paternal Grandmother   . Hypertension Paternal Grandmother   . Depression Maternal Grandmother     Social History: I have reviewed social history from my progress note on 01/06/2018. Social History   Socioeconomic History  . Marital status: Married    Spouse name: michael  . Number of children: 3  . Years of education: Not on file  . Highest education level: Associate degree: occupational, Scientist, product/process developmenttechnical, or vocational program  Occupational History  . Not on file  Social Needs  . Financial resource strain: Not hard at all  . Food insecurity:    Worry: Never true    Inability: Never true  . Transportation needs:    Medical: No    Non-medical: No  Tobacco Use  . Smoking status: Former Smoker    Types: Cigarettes    Last attempt to quit: 10/08/2006    Years since quitting: 12.1  . Smokeless tobacco: Never Used  Substance and Sexual  Activity  . Alcohol use: Yes    Alcohol/week: 5.0 standard drinks    Types: 4 Glasses of wine, 1 Cans of beer per week  . Drug use: No  . Sexual activity: Yes    Birth control/protection: None  Lifestyle  . Physical activity:    Days per week: 4 days    Minutes per session: Not on file  . Stress: Very much  Relationships  . Social connections:    Talks on phone: More than three times a week    Gets together: More than three times a week    Attends religious service: More than 4 times per year    Active member of club or organization: No    Attends  meetings of clubs or organizations: Never    Relationship status: Married  Other Topics Concern  . Not on file  Social History Narrative  . Not on file    Allergies:  Allergies  Allergen Reactions  . Codeine Itching    Metabolic Disorder Labs: No results found for: HGBA1C, MPG No results found for: PROLACTIN No results found for: CHOL, TRIG, HDL, CHOLHDL, VLDL, LDLCALC Lab Results  Component Value Date   TSH 2.59 08/09/2018    Therapeutic Level Labs: No results found for: LITHIUM No results found for: VALPROATE No components found for:  CBMZ  Current Medications: Current Outpatient Medications  Medication Sig Dispense Refill  . [START ON 11/26/2018] amphetamine-dextroamphetamine (ADDERALL XR) 20 MG 24 hr capsule Take 1 capsule (20 mg total) by mouth daily. 30 capsule 0  . [START ON 12/25/2018] amphetamine-dextroamphetamine (ADDERALL XR) 20 MG 24 hr capsule Take 1 capsule (20 mg total) by mouth daily. 30 capsule 0  . clonazePAM (KLONOPIN) 0.5 MG tablet Take 1 tablet (0.5 mg total) by mouth daily as needed for anxiety. 30 tablet 1  . hydrOXYzine (VISTARIL) 50 MG capsule Take 1 capsule (50 mg total) by mouth at bedtime as needed. For sleep 30 capsule 1  . loratadine (CLARITIN) 10 MG tablet Take 1 tablet (10 mg total) by mouth daily. 30 tablet 5  . meclizine (ANTIVERT) 25 MG tablet Take 1 tablet (25 mg total) by mouth 3 (three) times daily as needed for dizziness. 30 tablet 1  . sertraline (ZOLOFT) 100 MG tablet Take 2 tablets (200 mg total) by mouth daily. 180 tablet 1  . Suvorexant (BELSOMRA) 10 MG TABS Take 10 mg by mouth at bedtime. 30 tablet 1  . amphetamine-dextroamphetamine (ADDERALL) 10 MG tablet Take 1 tablet (10 mg total) by mouth daily after lunch. 30 tablet 0   No current facility-administered medications for this visit.      Musculoskeletal: Strength & Muscle Tone: within normal limits Gait & Station: normal Patient leans: N/A  Psychiatric Specialty  Exam: Review of Systems  Psychiatric/Behavioral: The patient is not nervous/anxious.   All other systems reviewed and are negative.   Blood pressure 137/79, pulse 83, temperature 97.9 F (36.6 C), temperature source Oral, weight 158 lb 3.2 oz (71.8 kg).Body mass index is 23.7 kg/m.  General Appearance: Casual  Eye Contact:  Fair  Speech:  Normal Rate  Volume:  Normal  Mood:  Euthymic  Affect:  Congruent  Thought Process:  Goal Directed and Descriptions of Associations: Intact  Orientation:  Full (Time, Place, and Person)  Thought Content: Logical   Suicidal Thoughts:  No  Homicidal Thoughts:  No  Memory:  Immediate;   Fair Recent;   Fair Remote;   Fair  Judgement:  Fair  Insight:  Fair  Psychomotor Activity:  Normal  Concentration:  Concentration: Fair and Attention Span: Fair  Recall:  [employer] of Knowledge: Fair  Language: Fair  Akathisia:  No  Handed:  Right  AIMS (if indicated): Denies tremors, rigidity, stiffness  Assets:  Communication Skills Desire for Improvement Social Support  ADL's:  Intact  Cognition: WNL  Sleep:  Fair   Screenings:   Assessment and Plan: Mekaela is a 48 year old Caucasian female who has a history of depression, anxiety, ADD, presented to clinic today for a follow-up visit.  Patient continues to struggle with some concentration and attention problems.  We will continue to make medication readjustment.  Plan Depression-stable Zoloft 200 mg p.o. daily   For anxiety-stable Zoloft as prescribed She is on Klonopin but has been limiting use.  For insomnia-stable Belsomra 10 mg p.o. nightly as needed Hydroxyzine 50 mg p.o. nightly as needed.  For ADD-unstable Adderall extended release 20 mg p.o. daily.  Provided her 2 scripts with date specified-last one to be filled on or after 12/25/2018. Increase Adderall 10 mg p.o. daily q. noon.  Provided her 1 prescription.  Discussed with patient to reach out to writer if she tolerates the 10  mg well, for future scripts. Reviewed [place] controlled substance database.  Follow-up in clinic in 2 months or sooner if needed.  I have spent atleast 15 minutes face to face with patient today. More than 50 % of the time was spent for psychoeducation and supportive psychotherapy and care coordination.  This note was generated in part or whole with voice recognition software. Voice recognition is usually quite accurate but there are transcription errors that can and very often do occur. I apologize for any typographical errors that were not detected and corrected.        Jomarie Longs, MD 11/16/2018, 5:15 PM

## 2018-11-28 ENCOUNTER — Encounter: Payer: Self-pay | Admitting: Family Medicine

## 2018-11-28 ENCOUNTER — Ambulatory Visit: Payer: Managed Care, Other (non HMO) | Admitting: Family Medicine

## 2018-11-28 VITALS — BP 100/60 | HR 81 | Temp 98.0°F | Resp 16 | Ht 70.0 in | Wt 157.4 lb

## 2018-11-28 DIAGNOSIS — Z8379 Family history of other diseases of the digestive system: Secondary | ICD-10-CM | POA: Diagnosis not present

## 2018-11-28 DIAGNOSIS — R198 Other specified symptoms and signs involving the digestive system and abdomen: Secondary | ICD-10-CM

## 2018-11-28 DIAGNOSIS — R1084 Generalized abdominal pain: Secondary | ICD-10-CM | POA: Diagnosis not present

## 2018-11-28 LAB — CBC WITH DIFFERENTIAL/PLATELET
Basophils Absolute: 0.1 10*3/uL (ref 0.0–0.1)
Basophils Relative: 2.4 % (ref 0.0–3.0)
Eosinophils Absolute: 0.1 10*3/uL (ref 0.0–0.7)
Eosinophils Relative: 2.6 % (ref 0.0–5.0)
HCT: 38.4 % (ref 36.0–46.0)
Hemoglobin: 12.8 g/dL (ref 12.0–15.0)
LYMPHS ABS: 1.3 10*3/uL (ref 0.7–4.0)
Lymphocytes Relative: 23.5 % (ref 12.0–46.0)
MCHC: 33.3 g/dL (ref 30.0–36.0)
MCV: 88.2 fl (ref 78.0–100.0)
Monocytes Absolute: 0.5 10*3/uL (ref 0.1–1.0)
Monocytes Relative: 9.2 % (ref 3.0–12.0)
NEUTROS PCT: 62.3 % (ref 43.0–77.0)
Neutro Abs: 3.4 10*3/uL (ref 1.4–7.7)
Platelets: 287 10*3/uL (ref 150.0–400.0)
RBC: 4.35 Mil/uL (ref 3.87–5.11)
RDW: 14.7 % (ref 11.5–15.5)
WBC: 5.5 10*3/uL (ref 4.0–10.5)

## 2018-11-28 LAB — LIPASE: LIPASE: 30 U/L (ref 11.0–59.0)

## 2018-11-28 LAB — AMYLASE: Amylase: 38 U/L (ref 27–131)

## 2018-11-28 LAB — BASIC METABOLIC PANEL
BUN: 8 mg/dL (ref 6–23)
CALCIUM: 9.8 mg/dL (ref 8.4–10.5)
CO2: 27 meq/L (ref 19–32)
Chloride: 105 mEq/L (ref 96–112)
Creatinine, Ser: 0.74 mg/dL (ref 0.40–1.20)
GFR: 84.75 mL/min (ref >60.00)
Glucose, Bld: 84 mg/dL (ref 70–99)
Potassium: 4.6 mEq/L (ref 3.5–5.1)
Sodium: 138 mEq/L (ref 135–145)

## 2018-11-28 LAB — HEPATIC FUNCTION PANEL
ALT: 13 U/L (ref 0–35)
AST: 16 U/L (ref 0–37)
Albumin: 4.2 g/dL (ref 3.5–5.2)
Alkaline Phosphatase: 62 U/L (ref 39–117)
Bilirubin, Direct: 0.1 mg/dL (ref 0.0–0.3)
Total Bilirubin: 0.4 mg/dL (ref 0.2–1.2)
Total Protein: 6.3 g/dL (ref 6.0–8.3)

## 2018-11-28 NOTE — Progress Notes (Signed)
Subjective:    Patient ID: Tracey Lester, female    DOB: June 05, 1971, 48 y.o.   MRN: [id number]  HPI   Patient presents to clinic complaining of epigastric pain that is stabbing off and on at times, as well as lower abdominal pain bilaterally that occurs off and on for the past 30 years.  Patient states she has tried doing different elimination diets and has noticed when she avoids excess sugar and avoids gluten she tends to feel better.  Patient also states she cannot tolerate dairy, usually when has dairy products will have diarrhea right away.  Patient states her stools alternate between diarrhea and being constipated, this has become her normal over the past 30 years.  Patient states she recently was told by family member that a few of her cousins and were diagnosed with celiacs disease.   Patient denies any family history of colon cancer.  Patient Active Problem List   Diagnosis Date Noted  . Family history of celiac disease 11/28/2018  . Generalized abdominal pain 11/28/2018  . Alternating constipation and diarrhea 11/28/2018   Social History   Tobacco Use  . Smoking status: Former Smoker    Types: Cigarettes    Last attempt to quit: 10/08/2006    Years since quitting: 12.1  . Smokeless tobacco: Never Used  Substance Use Topics  . Alcohol use: Yes    Alcohol/week: 5.0 standard drinks    Types: 4 Glasses of wine, 1 Cans of beer per week    Review of Systems  Constitutional: Negative for chills, fatigue and fever.  HENT: Negative for congestion, ear pain, sinus pain and sore throat.   Eyes: Negative.   Respiratory: Negative for cough, shortness of breath and wheezing.   Cardiovascular: Negative for chest pain, palpitations and leg swelling.  Gastrointestinal: No nausea and vomiting. +ABD pain, constipation/diarrhea for years.  Genitourinary: Negative for dysuria, frequency and urgency.  Musculoskeletal: Negative for arthralgias and myalgias.  Skin: Negative for color  change, pallor and rash.  Neurological: Negative for syncope, light-headedness and headaches.  Psychiatric/Behavioral: The patient is not nervous/anxious.       Objective:   Physical Exam Vitals signs and nursing note reviewed.  Constitutional:      General: She is not in acute distress.    Appearance: She is not ill-appearing or toxic-appearing.  HENT:     Head: Normocephalic and atraumatic.  Eyes:     General: No scleral icterus.    Extraocular Movements: Extraocular movements intact.     Conjunctiva/sclera: Conjunctivae normal.  Neck:     Musculoskeletal: Neck supple. No neck rigidity.  Cardiovascular:     Rate and Rhythm: Normal rate and regular rhythm.  Pulmonary:     Breath sounds: Normal breath sounds.  Abdominal:     General: Abdomen is flat. Bowel sounds are normal. There is no distension.     Palpations: Abdomen is soft.     Tenderness: There is abdominal tenderness (+epigastric, +RLQ, +LLQ). There is no right CVA tenderness, left CVA tenderness, guarding or rebound.     Hernia: No hernia is present.  Lymphadenopathy:     Cervical: No cervical adenopathy.  Skin:    General: Skin is warm and dry.     Coloration: Skin is not jaundiced or pale.  Neurological:     Mental Status: She is alert and oriented to person, place, and time.     Gait: Gait normal.  Psychiatric:        Mood and Affect:  Mood normal.        Behavior: Behavior normal.    Vitals:   11/28/18 0810  BP: 100/60  Pulse: 81  Resp: 16  Temp: 98 F (36.7 C)  SpO2: 99%      Assessment & Plan:   Generalized abdominal pain, alternating constipation diarrhea, family history of celiac - we will get lab work today including CBC, BMP, hepatic panel, celiac panel to further investigate.  Patient will also get abdominal ultrasound due to having this pain off and on for many years.  GI referral placed as well.  I suggested patient keep a list of foods that seem to bother her symptoms more than others and  try to avoid these foods that are bothersome.  Patient aware someone will contact her in regards to setting up ultrasound and GI referral.  Advised that she does not hear from someone in the next 7 days to call office and let us know.

## 2018-12-02 ENCOUNTER — Encounter: Payer: Self-pay | Admitting: Lab

## 2018-12-02 LAB — RETICULIN ANTIBODIES, IGA W TITER: Reticulin IGA Screen: NEGATIVE

## 2018-12-02 LAB — TISSUE TRANSGLUTAMINASE, IGA: (tTG) Ab, IgA: 1 U/mL

## 2018-12-02 LAB — GLIADIN ANTIBODIES, SERUM
Gliadin IgA: 2 Units
Gliadin IgG: 2 Units

## 2018-12-07 ENCOUNTER — Encounter: Payer: Self-pay | Admitting: *Deleted

## 2018-12-19 ENCOUNTER — Telehealth: Payer: Self-pay

## 2018-12-19 ENCOUNTER — Ambulatory Visit
Admission: RE | Admit: 2018-12-19 | Discharge: 2018-12-19 | Disposition: A | Discharge status: Home / Self Care | Payer: Managed Care, Other (non HMO) | Source: Ambulatory Visit | Attending: Family Medicine | Admitting: Family Medicine

## 2018-12-19 DIAGNOSIS — R1084 Generalized abdominal pain: Secondary | ICD-10-CM | POA: Diagnosis present

## 2018-12-19 NOTE — Telephone Encounter (Signed)
pt called left message that she needs a rx for adderall 10mg  sent to [facility].

## 2018-12-20 NOTE — Telephone Encounter (Signed)
Patient has a script for adderall pending at pharmacy to be filled on 12/25/2018

## 2018-12-23 ENCOUNTER — Encounter: Payer: Self-pay | Admitting: Psychiatry

## 2018-12-23 ENCOUNTER — Telehealth: Payer: Self-pay | Admitting: Psychiatry

## 2018-12-23 DIAGNOSIS — F900 Attention-deficit hyperactivity disorder, predominantly inattentive type: Secondary | ICD-10-CM

## 2018-12-23 MED ORDER — AMPHETAMINE-DEXTROAMPHETAMINE 10 MG PO TABS: 10.0000 mg | ORAL_TABLET | Freq: Every day | ORAL | 0 refills | Status: DC

## 2018-12-23 NOTE — Telephone Encounter (Signed)
Medication management - Message left for patient the Adderall 10 mg new order had been sent to her [facility] as requested today and was verified received.  Requested pt call back if any more problems getting refill.

## 2018-12-23 NOTE — Telephone Encounter (Signed)
Medication management - Called [facility]'s [facility] after receiving a message from pt they still did not have her prescription for Adderall 10 mg at [facility].  Pharmacist verified they had orders for the Adderall XR 20 mg but last order for regular Adderall 10 mg was 11/16/18 and that is what patient is in need of currently.  Verified with order history and agreed to send request to Dr. Elna Breslow.

## 2018-12-23 NOTE — Telephone Encounter (Signed)
I have sent Adderall 10 [facility]

## 2018-12-23 NOTE — Telephone Encounter (Signed)
Sent it

## 2018-12-25 ENCOUNTER — Other Ambulatory Visit: Payer: Self-pay | Admitting: Psychiatry

## 2018-12-25 DIAGNOSIS — F411 Generalized anxiety disorder: Secondary | ICD-10-CM

## 2018-12-26 NOTE — Telephone Encounter (Signed)
pt called left message that [facility] did not have her medication in stock can you please send rx to  [facility] [address]

## 2018-12-28 ENCOUNTER — Ambulatory Visit: Payer: Managed Care, Other (non HMO) | Admitting: Gastroenterology

## 2018-12-28 MED ORDER — CLONAZEPAM 0.5 MG PO TABS: 0.5000 mg | ORAL_TABLET | Freq: Every day | ORAL | 1 refills | Status: DC | PRN

## 2018-12-28 NOTE — Telephone Encounter (Signed)
Sent Klonopin to [facility] [address]

## 2019-01-06 ENCOUNTER — Other Ambulatory Visit: Payer: Self-pay

## 2019-01-06 ENCOUNTER — Encounter: Payer: Self-pay | Admitting: Gastroenterology

## 2019-01-06 ENCOUNTER — Ambulatory Visit: Payer: Managed Care, Other (non HMO) | Admitting: Gastroenterology

## 2019-01-06 VITALS — BP 116/72 | HR 71 | Ht 70.0 in | Wt 155.2 lb

## 2019-01-06 DIAGNOSIS — R1013 Epigastric pain: Secondary | ICD-10-CM | POA: Diagnosis not present

## 2019-01-06 DIAGNOSIS — K582 Mixed irritable bowel syndrome: Secondary | ICD-10-CM

## 2019-01-06 MED ORDER — DICYCLOMINE HCL 10 MG PO CAPS: 10.0000 mg | ORAL_CAPSULE | Freq: Three times a day (TID) | ORAL | 0 refills | Status: DC

## 2019-01-06 NOTE — Progress Notes (Signed)
Arlyss Repress, MD [address]  [address]  [place], [place] [address]  Main: [phone]  Fax: [phone]    Gastroenterology Consultation  Referring Provider:     Tracey Harries, FNP Primary Care Physician:  Tracey Harries, FNP Primary Gastroenterologist:  Dr. Arlyss Repress Reason for Consultation:     Abdominal pain, altered bowel habits        HPI:   Tracey Lester is a 48 y.o. female referred by Dr. Tracey Harries, FNP  for consultation & management of chronic stomach issues ongoing for few years.  Her symptoms include diffuse abdominal pain, bloating, altered bowel habits varying between diarrhea and constipation.  She has these episodes occurring every 2 months, last for 1 to 2 weeks.  She restricts certain foods especially processed sugars to keep her symptoms under control.  She is completely asymptomatic in between these episodes.  She has history of ADHD, anxiety and depression which are well under control.  She is also a Set designer, performs yoga regularly.  She has tried probiotics intermittently.  Celiac serologies were negative.  CBC, TSH, CMP unremarkable.  She wanted to seek consultation with GI to learn about various treatment options for her symptoms.  She denies nausea, vomiting, weight loss, rectal bleeding, hematochezia, hematemesis, reflux, regurgitation, dysphagia  She is currently asymptomatic  NSAIDs: None  Antiplts/Anticoagulants/Anti thrombotics: None  GI Procedures: None She denies family history of GI malignancy  Past Medical History:  Diagnosis Date  . ADHD (attention deficit hyperactivity disorder)   . Anxiety   . Depression     Past Surgical History:  Procedure Laterality Date  . TONSILLECTOMY      Current Outpatient Medications:  .  amphetamine-dextroamphetamine (ADDERALL XR) 20 MG 24 hr capsule, Take 1 capsule (20 mg total) by mouth daily., Disp: 30 capsule, Rfl: 0 .  amphetamine-dextroamphetamine (ADDERALL) 10 MG tablet,  Take 1 tablet (10 mg total) by mouth daily after lunch., Disp: 30 tablet, Rfl: 0 .  clonazePAM (KLONOPIN) 0.5 MG tablet, Take 1 tablet (0.5 mg total) by mouth daily as needed for anxiety., Disp: 28 tablet, Rfl: 1 .  sertraline (ZOLOFT) 100 MG tablet, Take 2 tablets (200 mg total) by mouth daily., Disp: 180 tablet, Rfl: 1 .  Suvorexant (BELSOMRA) 10 MG TABS, Take 10 mg by mouth at bedtime., Disp: 30 tablet, Rfl: 1 .  dicyclomine (BENTYL) 10 MG capsule, Take 1 capsule (10 mg total) by mouth 4 (four) times daily -  before meals and at bedtime., Disp: 30 capsule, Rfl: 0 .  hydrOXYzine (VISTARIL) 50 MG capsule, Take 1 capsule (50 mg total) by mouth at bedtime as needed. For sleep (Patient not taking: Reported on 01/06/2019), Disp: 30 capsule, Rfl: 1 .  loratadine (CLARITIN) 10 MG tablet, Take 1 tablet (10 mg total) by mouth daily. (Patient not taking: Reported on 01/06/2019), Disp: 30 tablet, Rfl: 5 .  meclizine (ANTIVERT) 25 MG tablet, Take 1 tablet (25 mg total) by mouth 3 (three) times daily as needed for dizziness. (Patient not taking: Reported on 01/06/2019), Disp: 30 tablet, Rfl: 1   Family History  Problem Relation Age of Onset  . Anxiety disorder Mother   . Depression Mother   . Hypertension Mother   . Anxiety disorder Father   . Depression Father   . Hypertension Father   . Schizophrenia Paternal Grandmother   . Depression Paternal Grandmother   . Hearing loss Paternal Grandmother   . Hypertension Paternal Grandmother   .  Depression Maternal Grandmother      Social History   Tobacco Use  . Smoking status: Former Smoker    Types: Cigarettes    Last attempt to quit: 10/08/2006    Years since quitting: 12.2  . Smokeless tobacco: Never Used  Substance Use Topics  . Alcohol use: Yes    Alcohol/week: 5.0 standard drinks    Types: 4 Glasses of wine, 1 Cans of beer per week  . Drug use: No    Allergies as of 01/06/2019 - Review Complete 01/06/2019  Allergen Reaction Noted  .  Codeine Itching 05/08/2017    Review of Systems:    All systems reviewed and negative except where noted in HPI.   Physical Exam:  BP 116/72   Pulse 71   Ht 5\' 10"  (1.778 m)   Wt 155 lb 3.2 oz (70.4 kg)   BMI 22.27 kg/m  No LMP recorded.  General:   Alert,  Well-developed, well-nourished, pleasant and cooperative in NAD Head:  Normocephalic and atraumatic. Eyes:  Sclera clear, no icterus.   Conjunctiva pink. Ears:  Normal auditory acuity. Nose:  No deformity, discharge, or lesions. Mouth:  No deformity or lesions,oropharynx pink & moist. Neck:  Supple; no masses or thyromegaly. Lungs:  Respirations even and unlabored.  Clear throughout to auscultation.   No wheezes, crackles, or rhonchi. No acute distress. Heart:  Regular rate and rhythm; no murmurs, clicks, rubs, or gallops. Abdomen:  Normal bowel sounds. Soft, non-tender and non-distended without masses, hepatosplenomegaly or hernias noted.  No guarding or rebound tenderness.   Rectal: Not performed Msk:  Symmetrical without gross deformities. Good, equal movement & strength bilaterally. Pulses:  Normal pulses noted. Extremities:  No clubbing or edema.  No cyanosis. Neurologic:  Alert and oriented x3;  grossly normal neurologically. Skin:  Intact without significant lesions or rashes. No jaundice. Psych:  Alert and cooperative. Normal mood and affect.  Imaging Studies: Ultrasound abdomen in 2/20 normal  Assessment and Plan:   Tracey Lester is a 48 y.o. female with history of ADHD, anxiety and depression otherwise healthy with chronic symptoms of generalized abdominal pain associated with bloating, altered bowel habits.  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  I discussed in length about various treatment options to relieve symptoms during flareup including probiotics, IBgard, low FODMAPs diet.  Her anxiety and depression always under control.  She performs yoga regularly.  Advised her that she  can try probiotics such as VSL#3, IBgard, antispasmodics such as dicyclomine during flareups to see if these options would help with severity and duration of her flareups.  She is also interested in low FODMAPs diet, education material provided.  Will perform H. pylori breath test as she is complaining of epigastric pain and treat if positive   Follow up in 3 months   Arlyss Repress, MD

## 2019-01-07 ENCOUNTER — Other Ambulatory Visit: Payer: Self-pay | Admitting: Psychiatry

## 2019-01-12 ENCOUNTER — Other Ambulatory Visit: Payer: Self-pay | Admitting: Gastroenterology

## 2019-01-12 DIAGNOSIS — K582 Mixed irritable bowel syndrome: Secondary | ICD-10-CM

## 2019-01-17 ENCOUNTER — Telehealth: Payer: Self-pay

## 2019-01-17 ENCOUNTER — Other Ambulatory Visit: Payer: Self-pay | Admitting: Gastroenterology

## 2019-01-17 DIAGNOSIS — F900 Attention-deficit hyperactivity disorder, predominantly inattentive type: Secondary | ICD-10-CM

## 2019-01-17 DIAGNOSIS — K582 Mixed irritable bowel syndrome: Secondary | ICD-10-CM

## 2019-01-17 MED ORDER — AMPHETAMINE-DEXTROAMPHET ER 20 MG PO CP24: 20.0000 mg | ORAL_CAPSULE | Freq: Every day | ORAL | 0 refills | Status: DC

## 2019-01-17 MED ORDER — AMPHETAMINE-DEXTROAMPHETAMINE 10 MG PO TABS: 10.0000 mg | ORAL_TABLET | Freq: Every day | ORAL | 0 refills | Status: DC

## 2019-01-17 NOTE — Telephone Encounter (Signed)
Sent Adderall 10 mg and ER 20 mg to pharmacy

## 2019-01-17 NOTE — Telephone Encounter (Signed)
pt called states that she needs a refill on her medication of adderall both of them please sent to [facility].

## 2019-01-20 ENCOUNTER — Other Ambulatory Visit: Payer: Self-pay | Admitting: Psychiatry

## 2019-01-20 DIAGNOSIS — F330 Major depressive disorder, recurrent, mild: Secondary | ICD-10-CM

## 2019-01-26 ENCOUNTER — Ambulatory Visit (INDEPENDENT_AMBULATORY_CARE_PROVIDER_SITE_OTHER): Payer: 59 | Admitting: Psychiatry

## 2019-01-26 ENCOUNTER — Encounter: Payer: Self-pay | Admitting: Psychiatry

## 2019-01-26 ENCOUNTER — Other Ambulatory Visit: Payer: Self-pay

## 2019-01-26 DIAGNOSIS — F411 Generalized anxiety disorder: Secondary | ICD-10-CM

## 2019-01-26 DIAGNOSIS — F900 Attention-deficit hyperactivity disorder, predominantly inattentive type: Secondary | ICD-10-CM | POA: Diagnosis not present

## 2019-01-26 DIAGNOSIS — F330 Major depressive disorder, recurrent, mild: Secondary | ICD-10-CM

## 2019-01-26 DIAGNOSIS — F5105 Insomnia due to other mental disorder: Secondary | ICD-10-CM | POA: Diagnosis not present

## 2019-01-26 MED ORDER — SUVOREXANT 10 MG PO TABS: 1.0000 | ORAL_TABLET | Freq: Every day | ORAL | 1 refills | Status: DC

## 2019-01-26 MED ORDER — BUSPIRONE HCL 7.5 MG PO TABS: 7.5000 mg | ORAL_TABLET | Freq: Two times a day (BID) | ORAL | 1 refills | Status: DC

## 2019-01-26 NOTE — Progress Notes (Signed)
Virtual Visit via Telephone Note  I connected with Tracey Lester on 01/26/19 at 11:00 AM EDT by telephone and verified that I am speaking with the correct person using two identifiers.   I discussed the limitations, risks, security and privacy concerns of performing an evaluation and management service by telephone and the availability of in person appointments. I also discussed with the patient that there may be a patient responsible charge related to this service. The patient expressed understanding and agreed to proceed.    I discussed the assessment and treatment plan with the patient. The patient was provided an opportunity to ask questions and all were answered. The patient agreed with the plan and demonstrated an understanding of the instructions.   The patient was advised to call back or seek an in-person evaluation if the symptoms worsen or if the condition fails to improve as anticipated.  I provided 15 minutes of non-face-to-face time during this encounter.   Jomarie Longs, MD  [facility] MD OP Progress Note  01/26/2019 12:23 PM Tracey Lester  MRN:  [id number]  Chief Complaint:  Chief Complaint    Follow-up     HPI: Tracey Lester is a 48 year old Caucasian female, married, lives in [place], was evaluated by phone today.  She has a history of anxiety disorder, MDD, insomnia and ADHD.  Patient today reports she is currently struggling with mood symptoms.  She reports she is worried about the COVID-19 crisis.  Her children stays home.  They are being homeschooled.  She hence reports it is a lot of work for her throughout the day.  She has not been going to her yoga sessions anymore due to the virus outbreak.  Her husband stays home and work.  She reports he had not been spending much quality time with each other and that is also a stressor for her.  Patient reports she is interested in medication changes.  She is compliant on her Zoloft which helps to some extent.  Discussed adding BuSpar  she agrees with plan.  Patient reports Belsomra was initially helpful with her sleep however recently with all the situational stressors her sleep is also restless.  Discussed with her to work on her sleep hygiene, discussed relaxation techniques.  She also has hydroxyzine available when she needs it.  Discussed with patient to reach out to the clinic if she is interested in dosage increase of her Belsomra.  Patient reports she has been taking her Klonopin as needed.  Patient aware of the need to limit use as much as possible.  Patient is compliant with her Adderall.  Patient denies any suicidality, homicidality or perceptual disturbances.  Patient denies any other concerns today. Visit Diagnosis:    ICD-10-CM   1. GAD (generalized anxiety disorder) F41.1 busPIRone (BUSPAR) 7.5 MG tablet    Suvorexant (BELSOMRA) 10 MG TABS  2. MDD (major depressive disorder), recurrent episode, mild (HCC) F33.0 busPIRone (BUSPAR) 7.5 MG tablet    Suvorexant (BELSOMRA) 10 MG TABS  3. Insomnia due to mental condition F51.05 Suvorexant (BELSOMRA) 10 MG TABS  4. Attention deficit hyperactivity disorder (ADHD), predominantly inattentive type F90.0     Past Psychiatric History: I have reviewed past psychiatric history from my progress note on 01/06/2018.  Past trials of Prozac-made her happy, Wellbutrin, Cymbalta  Past Medical History:  Past Medical History:  Diagnosis Date  . ADHD (attention deficit hyperactivity disorder)   . Anxiety   . Depression     Past Surgical History:  Procedure Laterality Date  .  TONSILLECTOMY      Family Psychiatric History: Reviewed family psychiatric history from my progress note on 01/06/2018.  Family History:  Family History  Problem Relation Age of Onset  . Anxiety disorder Mother   . Depression Mother   . Hypertension Mother   . Anxiety disorder Father   . Depression Father   . Hypertension Father   . Schizophrenia Paternal Grandmother   . Depression Paternal  Grandmother   . Hearing loss Paternal Grandmother   . Hypertension Paternal Grandmother   . Depression Maternal Grandmother     Social History: Reviewed social history from my progress note on 01/06/2018. Social History   Socioeconomic History  . Marital status: Married    Spouse name: michael  . Number of children: 3  . Years of education: Not on file  . Highest education level: Associate degree: occupational, Scientist, product/process development, or vocational program  Occupational History  . Not on file  Social Needs  . Financial resource strain: Not hard at all  . Food insecurity:    Worry: Never true    Inability: Never true  . Transportation needs:    Medical: No    Non-medical: No  Tobacco Use  . Smoking status: Former Smoker    Types: Cigarettes    Last attempt to quit: 10/08/2006    Years since quitting: 12.3  . Smokeless tobacco: Never Used  Substance and Sexual Activity  . Alcohol use: Yes    Alcohol/week: 5.0 standard drinks    Types: 4 Glasses of wine, 1 Cans of beer per week  . Drug use: No  . Sexual activity: Yes    Birth control/protection: None  Lifestyle  . Physical activity:    Days per week: 4 days    Minutes per session: Not on file  . Stress: Very much  Relationships  . Social connections:    Talks on phone: More than three times a week    Gets together: More than three times a week    Attends religious service: More than 4 times per year    Active member of club or organization: No    Attends meetings of clubs or organizations: Never    Relationship status: Married  Other Topics Concern  . Not on file  Social History Narrative  . Not on file    Allergies:  Allergies  Allergen Reactions  . Codeine Itching    Metabolic Disorder Labs: No results found for: HGBA1C, MPG No results found for: PROLACTIN No results found for: CHOL, TRIG, HDL, CHOLHDL, VLDL, LDLCALC Lab Results  Component Value Date   TSH 2.59 08/09/2018    Therapeutic Level Labs: No results  found for: LITHIUM No results found for: VALPROATE No components found for:  CBMZ  Current Medications: Current Outpatient Medications  Medication Sig Dispense Refill  . amphetamine-dextroamphetamine (ADDERALL XR) 20 MG 24 hr capsule Take 1 capsule (20 mg total) by mouth daily. 30 capsule 0  . amphetamine-dextroamphetamine (ADDERALL) 10 MG tablet Take 1 tablet (10 mg total) by mouth daily after lunch. 30 tablet 0  . busPIRone (BUSPAR) 7.5 MG tablet Take 1 tablet (7.5 mg total) by mouth 2 (two) times daily. 60 tablet 1  . clonazePAM (KLONOPIN) 0.5 MG tablet Take 1 tablet (0.5 mg total) by mouth daily as needed for anxiety. 28 tablet 1  . dicyclomine (BENTYL) 10 MG capsule TAKE ONE CAPSULE BY MOUTH FOUR TIMES A DAY (BEFORE MEALS AND BEDTIME) 30 capsule 0  . loratadine (CLARITIN) 10 MG tablet  Take 1 tablet (10 mg total) by mouth daily. 30 tablet 5  . meclizine (ANTIVERT) 25 MG tablet Take 1 tablet (25 mg total) by mouth 3 (three) times daily as needed for dizziness. 30 tablet 1  . sertraline (ZOLOFT) 100 MG tablet TAKE TWO TABLETS BY MOUTH DAILY 60 tablet 2  . [START ON 02/07/2019] Suvorexant (BELSOMRA) 10 MG TABS Take 1 tablet by mouth at bedtime. 30 tablet 1  . hydrOXYzine (VISTARIL) 50 MG capsule Take 1 capsule (50 mg total) by mouth at bedtime as needed. For sleep (Patient not taking: Reported on 01/06/2019) 30 capsule 1   No current facility-administered medications for this visit.      Musculoskeletal: Strength & Muscle Tone: UTA Gait & Station: UTA Patient leans: N/A  Psychiatric Specialty Exam: Review of Systems  Psychiatric/Behavioral: The patient is nervous/anxious.   All other systems reviewed and are negative.   Last menstrual period 12/29/2018.There is no height or weight on file to calculate BMI.  General Appearance: UTA  Eye Contact:  UTA  Speech:  Clear and Coherent  Volume:  Decreased  Mood:  Anxious  Affect:  UTA  Thought Process:  Goal Directed and Descriptions of  Associations: Intact  Orientation:  Full (Time, Place, and Person)  Thought Content: Logical   Suicidal Thoughts:  No  Homicidal Thoughts:  No  Memory:  Immediate;   Fair Recent;   Fair Remote;   Fair  Judgement:  Fair  Insight:  Fair  Psychomotor Activity:  Normal  Concentration:  Concentration: Fair and Attention Span: Fair  Recall:  [employer] of Knowledge: Fair  Language: Fair  Akathisia:  No  Handed:  Right  AIMS (if indicated): Denies tremors, rigidity, stiffness  Assets:  Communication Skills Desire for Improvement Social Support  ADL's:  Intact  Cognition: WNL  Sleep:  Restless   Screenings:   Assessment and Plan: Caycee is a 48 year old Caucasian female who has a history of depression, anxiety, ADD was evaluated by phone today.  Patient continues to struggle with anxiety and depressive symptoms and will benefit from medication changes.  She continues to have psychosocial stressors.  Discussed plan as noted below.  Plan For depression-unstable Zoloft 200 mg p.o. daily Add BuSpar 7.5 mg p.o. twice daily  For anxiety-unstable Zoloft and BuSpar as prescribed. Discussed with patient about restarting psychotherapy sessions.  Also discussed relaxation techniques, mindfulness and yoga. Discussed with patient to limit use of Klonopin.  For insomnia- restless Belsomra 10 mg p.o. nightly as needed Hydroxyzine 50 mg p.o. nightly as needed  For ADD- improving Adderall extended release 20 mg p.o. daily Adderall 10 mg p.o. daily q. noon.  Discussed with patient to cut the 10 mg into half if she continues to have sleep problems.  Follow-up in clinic in 1 month or sooner if needed.  I have spent atleast 15 minutes non face to face with patient today. More than 50 % of the time was spent for psychoeducation and supportive psychotherapy and care coordination.  This note was generated in part or whole with voice recognition software. Voice recognition is usually quite  accurate but there are transcription errors that can and very often do occur. I apologize for any typographical errors that were not detected and corrected.        Jomarie Longs, MD 01/26/2019, 12:23 PM

## 2019-01-26 NOTE — Progress Notes (Signed)
Tc on  01-26-19 medical and surgery hx was review with no changes, pharmacy and medication was reviewed and updated, vital were not taken due to this is a phone consult.

## 2019-01-31 ENCOUNTER — Telehealth: Payer: Self-pay | Admitting: Gastroenterology

## 2019-01-31 NOTE — Telephone Encounter (Signed)
*  STAT* If patient is at the pharmacy, call can be transferred to refill team.   1. Which medications need to be refilled? (please list name of each medication and dose if known) VSL # [phone]  2. Which pharmacy/location (including street and city if local pharmacy) is medication to be sent to? [facility]  3. Do they need a 30 day or 90 day supply? 90 day.   Looking for alternative if can't get VSL #3. Please call patient.

## 2019-02-20 ENCOUNTER — Telehealth: Payer: Self-pay

## 2019-02-20 DIAGNOSIS — F900 Attention-deficit hyperactivity disorder, predominantly inattentive type: Secondary | ICD-10-CM

## 2019-02-20 MED ORDER — AMPHETAMINE-DEXTROAMPHET ER 20 MG PO CP24: 20.0000 mg | ORAL_CAPSULE | Freq: Every day | ORAL | 0 refills | Status: DC

## 2019-02-20 MED ORDER — AMPHETAMINE-DEXTROAMPHETAMINE 10 MG PO TABS: 10.0000 mg | ORAL_TABLET | Freq: Every day | ORAL | 0 refills | Status: DC

## 2019-02-20 NOTE — Telephone Encounter (Signed)
pt called to check on the stats of the adderall being sent to pharmacy.

## 2019-02-20 NOTE — Telephone Encounter (Signed)
pls let pt know adderall sent to pharmacy with dates specified

## 2019-02-20 NOTE — Telephone Encounter (Signed)
pt called states she needs a refill on both of her adderalls. to [facility]

## 2019-02-20 NOTE — Telephone Encounter (Signed)
Sent adderall to pharmacy with date specified

## 2019-02-28 ENCOUNTER — Encounter: Payer: Self-pay | Admitting: Psychiatry

## 2019-02-28 ENCOUNTER — Other Ambulatory Visit: Payer: Self-pay

## 2019-02-28 ENCOUNTER — Ambulatory Visit (INDEPENDENT_AMBULATORY_CARE_PROVIDER_SITE_OTHER): Payer: 59 | Admitting: Psychiatry

## 2019-02-28 DIAGNOSIS — F411 Generalized anxiety disorder: Secondary | ICD-10-CM | POA: Diagnosis not present

## 2019-02-28 DIAGNOSIS — F900 Attention-deficit hyperactivity disorder, predominantly inattentive type: Secondary | ICD-10-CM

## 2019-02-28 DIAGNOSIS — F330 Major depressive disorder, recurrent, mild: Secondary | ICD-10-CM | POA: Diagnosis not present

## 2019-02-28 DIAGNOSIS — F5105 Insomnia due to other mental disorder: Secondary | ICD-10-CM

## 2019-02-28 MED ORDER — AMPHETAMINE-DEXTROAMPHETAMINE 15 MG PO TABS: 15.0000 mg | ORAL_TABLET | Freq: Every day | ORAL | 0 refills | Status: DC

## 2019-02-28 MED ORDER — BUSPIRONE HCL 7.5 MG PO TABS: 7.5000 mg | ORAL_TABLET | Freq: Two times a day (BID) | ORAL | 1 refills | Status: DC

## 2019-02-28 NOTE — Progress Notes (Signed)
Virtual Visit via Video Note  I connected with Tracey Lester on 02/28/19 at  1:00 PM EDT by a video enabled telemedicine application and verified that I am speaking with the correct person using two identifiers.   I discussed the limitations of evaluation and management by telemedicine and the availability of in person appointments. The patient expressed understanding and agreed to proceed.   I discussed the assessment and treatment plan with the patient. The patient was provided an opportunity to ask questions and all were answered. The patient agreed with the plan and demonstrated an understanding of the instructions.   The patient was advised to call back or seek an in-person evaluation if the symptoms worsen or if the condition fails to improve as anticipated.  [facility] MD OP Progress Note  02/28/2019 1:45 PM Tracey Lester  MRN:  [id number]  Chief Complaint:  Chief Complaint    Follow-up     HPI: Tracey Lester is a 48 year old Caucasian female, married, lives in [name], was evaluated by telemedicine today.  Patient has a history of MDD, GAD, ADHD.  Patient today reports she is currently struggling with the COVID-19 outbreak.  Patient reports her husband stays home and work.  Her children are currently being homeschooled.  She hence tends to be busy throughout the day.  She reports she does feel anxious on and off however has been working on Brewing technologistrelaxation techniques, trying to do yoga.  She reports she continues to take Adderall however is hoping her dosage can be increased.  She reports she needs to put a lot of effort to focus and stay on task.  Patient reports sleep also as restless.  She reports it could be because of her anxiety symptoms.  Discussed with her to increase her Belsomra to 15, use her supplies.  Discussed with her to let writer know if she sleeps okay on the Belsomra 15 mg.  Patient denies any suicidality, homicidality or perceptual disturbances.  Patient is not seeing Ms.  Tracey Lester anymore and is not interested in referral to another therapist.  She reports BuSpar is helpful , she does not want a dosage increase today. Visit Diagnosis:    ICD-10-CM   1. GAD (generalized anxiety disorder) F41.1 busPIRone (BUSPAR) 7.5 MG tablet  2. MDD (major depressive disorder), recurrent episode, mild (HCC) F33.0 busPIRone (BUSPAR) 7.5 MG tablet  3. Insomnia due to mental condition F51.05   4. Attention deficit hyperactivity disorder (ADHD), predominantly inattentive type F90.0 amphetamine-dextroamphetamine (ADDERALL) 15 MG tablet    Past Psychiatric History: Reviewed past psychiatric history from my progress note on 01/06/2018.  Past trials of Prozac-made her happy, Wellbutrin, Cymbalta  Past Medical History:  Past Medical History:  Diagnosis Date  . ADHD (attention deficit hyperactivity disorder)   . Anxiety   . Depression     Past Surgical History:  Procedure Laterality Date  . TONSILLECTOMY      Family Psychiatric History: Reviewed family psychiatric history from my progress note on 01/06/2018.  Family History:  Family History  Problem Relation Age of Onset  . Anxiety disorder Mother   . Depression Mother   . Hypertension Mother   . Anxiety disorder Father   . Depression Father   . Hypertension Father   . Schizophrenia Paternal Grandmother   . Depression Paternal Grandmother   . Hearing loss Paternal Grandmother   . Hypertension Paternal Grandmother   . Depression Maternal Grandmother     Social History: I have reviewed social history from my progress note on  01/06/2018. Social History   Socioeconomic History  . Marital status: Married    Spouse name: Tracey Lester  . Number of children: 3  . Years of education: Not on file  . Highest education level: Associate degree: occupational, Scientist, product/process development, or vocational program  Occupational History  . Not on file  Social Needs  . Financial resource strain: Not hard at all  . Food insecurity:    Worry:  Never true    Inability: Never true  . Transportation needs:    Medical: No    Non-medical: No  Tobacco Use  . Smoking status: Former Smoker    Types: Cigarettes    Last attempt to quit: 10/08/2006    Years since quitting: 12.4  . Smokeless tobacco: Never Used  Substance and Sexual Activity  . Alcohol use: Yes    Alcohol/week: 5.0 standard drinks    Types: 4 Glasses of wine, 1 Cans of beer per week  . Drug use: No  . Sexual activity: Yes    Birth control/protection: None  Lifestyle  . Physical activity:    Days per week: 4 days    Minutes per session: Not on file  . Stress: Very much  Relationships  . Social connections:    Talks on phone: More than three times a week    Gets together: More than three times a week    Attends religious service: More than 4 times per year    Active member of club or organization: No    Attends meetings of clubs or organizations: Never    Relationship status: Married  Other Topics Concern  . Not on file  Social History Narrative  . Not on file    Allergies:  Allergies  Allergen Reactions  . Codeine Itching    Metabolic Disorder Labs: No results found for: HGBA1C, MPG No results found for: PROLACTIN No results found for: CHOL, TRIG, HDL, CHOLHDL, VLDL, LDLCALC Lab Results  Component Value Date   TSH 2.59 08/09/2018    Therapeutic Level Labs: No results found for: LITHIUM No results found for: VALPROATE No components found for:  CBMZ  Current Medications: Current Outpatient Medications  Medication Sig Dispense Refill  . amphetamine-dextroamphetamine (ADDERALL XR) 20 MG 24 hr capsule Take 1 capsule (20 mg total) by mouth daily. 30 capsule 0  . amphetamine-dextroamphetamine (ADDERALL) 15 MG tablet Take 1 tablet by mouth daily. At noon 30 tablet 0  . busPIRone (BUSPAR) 7.5 MG tablet Take 1 tablet (7.5 mg total) by mouth 2 (two) times daily. 60 tablet 1  . clonazePAM (KLONOPIN) 0.5 MG tablet Take 1 tablet (0.5 mg total) by mouth  daily as needed for anxiety. 28 tablet 1  . dicyclomine (BENTYL) 10 MG capsule TAKE ONE CAPSULE BY MOUTH FOUR TIMES A DAY (BEFORE MEALS AND BEDTIME) 30 capsule 0  . hydrOXYzine (VISTARIL) 50 MG capsule Take 1 capsule (50 mg total) by mouth at bedtime as needed. For sleep (Patient not taking: Reported on 01/06/2019) 30 capsule 1  . loratadine (CLARITIN) 10 MG tablet Take 1 tablet (10 mg total) by mouth daily. 30 tablet 5  . meclizine (ANTIVERT) 25 MG tablet Take 1 tablet (25 mg total) by mouth 3 (three) times daily as needed for dizziness. 30 tablet 1  . sertraline (ZOLOFT) 100 MG tablet TAKE TWO TABLETS BY MOUTH DAILY 60 tablet 2  . Suvorexant (BELSOMRA) 10 MG TABS Take 1 tablet by mouth at bedtime. 30 tablet 1   No current facility-administered medications for this visit.  Musculoskeletal: Strength & Muscle Tone: UTA Gait & Station: normal Patient leans: N/A  Psychiatric Specialty Exam: Review of Systems  Psychiatric/Behavioral: The patient is nervous/anxious and has insomnia.   All other systems reviewed and are negative.   There were no vitals taken for this visit.There is no height or weight on file to calculate BMI.  General Appearance: Casual  Eye Contact:  Fair  Speech:  Normal Rate  Volume:  Normal  Mood:  Anxious  Affect:  Appropriate  Thought Process:  Goal Directed and Descriptions of Associations: Intact  Orientation:  Full (Time, Place, and Person)  Thought Content: WDL   Suicidal Thoughts:  No  Homicidal Thoughts:  No  Memory:  Immediate;   Fair Recent;   Fair Remote;   Fair  Judgement:  Fair  Insight:  Good  Psychomotor Activity:  Normal  Concentration:  Concentration: Fair and Attention Span: Fair  Recall:  [employer] of Knowledge: Fair  Language: Fair  Akathisia:  No  Handed:  Right  AIMS (if indicated): Denies tremors, rigidity, stiffness  Assets:  Communication Skills Desire for Improvement Social Support  ADL's:  Intact  Cognition: WNL   Sleep:  Restless   Screenings:   Assessment and Plan: Maziah is a 48 year old Caucasian female who has a history of depression, anxiety, ADD was evaluated by telemedicine today.  Patient continues to struggle with anxiety and sleep problems as well as attention and focus.  Patient will continue to need medication readjustment.  Plan as noted below.  Plan For depression- improving Zoloft 200 mg p.o. daily BuSpar 7.5 mg p.o. twice daily.  For anxiety- improving Zoloft as prescribed BuSpar 7.5 mg p.o. twice daily.  Patient does not want her dosage to be increased any further. She will continue to work on relaxation techniques, mindfulness, yoga. Patient has been limiting use of Klonopin and reports she takes it once a week or so.  For insomnia-restless Discussed with patient to increase Belsomra to 15 mg p.o. nightly. Hydroxyzine 50 mg p.o. nightly as needed  For ADD-unstable Continue Adderall extended release 20 mg p.o. daily every morning. Increase Adderall to 15 mg after lunch. I have reviewed [place] controlled substance database.  Discussed with patient to reach out to writer in a week and leave a message about her Belsomra.  If she continues to have sleep problems her dosage needs to be readjusted.  Discussed referral to psychotherapist however declines.  Follow-up in clinic in 3 to 4 weeks or sooner if needed.  I have spent atleast 25 minutes non face to face with patient today. More than 50 % of the time was spent for psychoeducation and supportive psychotherapy and care coordination.  This note was generated in part or whole with voice recognition software. Voice recognition is usually quite accurate but there are transcription errors that can and very often do occur. I apologize for any typographical errors that were not detected and corrected.        Jomarie Longs, MD 02/28/2019, 1:45 PM

## 2019-03-23 ENCOUNTER — Telehealth: Payer: Self-pay

## 2019-03-23 DIAGNOSIS — F900 Attention-deficit hyperactivity disorder, predominantly inattentive type: Secondary | ICD-10-CM

## 2019-03-23 MED ORDER — AMPHETAMINE-DEXTROAMPHETAMINE 15 MG PO TABS: 15.0000 mg | ORAL_TABLET | Freq: Every day | ORAL | 0 refills | Status: DC

## 2019-03-23 MED ORDER — AMPHETAMINE-DEXTROAMPHET ER 20 MG PO CP24: 20.0000 mg | ORAL_CAPSULE | Freq: Every day | ORAL | 0 refills | Status: DC

## 2019-03-23 NOTE — Telephone Encounter (Signed)
pt called left a message twice stating that she needs a refill on her adderall

## 2019-03-23 NOTE — Telephone Encounter (Signed)
Sent Adderall to pharmacy

## 2019-03-27 ENCOUNTER — Telehealth: Payer: Self-pay | Admitting: Psychiatry

## 2019-03-27 ENCOUNTER — Ambulatory Visit (INDEPENDENT_AMBULATORY_CARE_PROVIDER_SITE_OTHER): Payer: 59 | Admitting: Psychiatry

## 2019-03-27 ENCOUNTER — Other Ambulatory Visit: Payer: Self-pay

## 2019-03-27 DIAGNOSIS — F330 Major depressive disorder, recurrent, mild: Secondary | ICD-10-CM

## 2019-03-27 DIAGNOSIS — Z5329 Procedure and treatment not carried out because of patient's decision for other reasons: Secondary | ICD-10-CM

## 2019-03-27 DIAGNOSIS — F411 Generalized anxiety disorder: Secondary | ICD-10-CM

## 2019-03-27 MED ORDER — SERTRALINE HCL 100 MG PO TABS: 200.0000 mg | ORAL_TABLET | Freq: Every day | ORAL | 2 refills | Status: DC

## 2019-03-27 MED ORDER — CLONAZEPAM 0.5 MG PO TABS: 0.5000 mg | ORAL_TABLET | Freq: Every day | ORAL | 0 refills | Status: DC | PRN

## 2019-03-27 NOTE — Telephone Encounter (Signed)
pls let her know medications sent to pharmacy.

## 2019-03-27 NOTE — Telephone Encounter (Signed)
pt called left message that she needed refills on her medications

## 2019-03-27 NOTE — Telephone Encounter (Signed)
Sent medications to pharmacy  

## 2019-03-27 NOTE — Progress Notes (Signed)
Attempted to call patient as well as sent [email or web address] link by text message. Left VM. No response.

## 2019-04-04 ENCOUNTER — Ambulatory Visit: Payer: Managed Care, Other (non HMO) | Admitting: Gastroenterology

## 2019-04-05 ENCOUNTER — Encounter: Payer: Self-pay | Admitting: Psychiatry

## 2019-04-05 ENCOUNTER — Other Ambulatory Visit: Payer: Self-pay

## 2019-04-05 ENCOUNTER — Ambulatory Visit (INDEPENDENT_AMBULATORY_CARE_PROVIDER_SITE_OTHER): Payer: 59 | Admitting: Psychiatry

## 2019-04-05 DIAGNOSIS — F900 Attention-deficit hyperactivity disorder, predominantly inattentive type: Secondary | ICD-10-CM

## 2019-04-05 DIAGNOSIS — F330 Major depressive disorder, recurrent, mild: Secondary | ICD-10-CM

## 2019-04-05 DIAGNOSIS — F411 Generalized anxiety disorder: Secondary | ICD-10-CM | POA: Diagnosis not present

## 2019-04-05 DIAGNOSIS — F5105 Insomnia due to other mental disorder: Secondary | ICD-10-CM | POA: Diagnosis not present

## 2019-04-05 MED ORDER — SUVOREXANT 15 MG PO TABS: 15.0000 mg | ORAL_TABLET | Freq: Every day | ORAL | 1 refills | Status: DC

## 2019-04-05 MED ORDER — AMPHETAMINE-DEXTROAMPHET ER 20 MG PO CP24: 20.0000 mg | ORAL_CAPSULE | Freq: Every day | ORAL | 0 refills | Status: DC

## 2019-04-05 NOTE — Progress Notes (Signed)
Virtual Visit via Video Note  I connected with Tracey Lester on 04/05/19 at  2:30 PM EDT by a video enabled telemedicine application and verified that I am speaking with the correct person using two identifiers.   I discussed the limitations of evaluation and management by telemedicine and the availability of in person appointments. The patient expressed understanding and agreed to proceed.   I discussed the assessment and treatment plan with the patient. The patient was provided an opportunity to ask questions and all were answered. The patient agreed with the plan and demonstrated an understanding of the instructions.   The patient was advised to call back or seek an in-person evaluation if the symptoms worsen or if the condition fails to improve as anticipated.  [facility] MD OP Progress Note  04/05/2019 3:58 PM Tracey Lester  MRN:  [id number]  Chief Complaint:  Chief Complaint    Follow-up     HPI: Tracey Lester is a 48 year old CF, married , lives in [place], has a history of GAD, MDD, Insomnia ,ADHD , was evaluated by telemedicine today.    Patient today reports she was unable to pick up her prescription from pharmacy.  She reports she was told by the pharmacist that they have not gotten it yet.  She reports she continues to have some good days and bad days.  She reports overall she has been doing okay.  She denies any significant side effects to Adderall however does report sleep problems.  Unknown if this is due to the Adderall or due to her anxiety.  Patient is currently on Belsomra.  She reports the 10 mg medication helps her to sleep some nights however does not help at all other nights. Discussed readjusting the dosage of Belsomra and she agrees with plan.  Patient reports she has not been able to meditate or do yoga recently because she has been unable to set boundaries at home.  Her children are being homeschooled and her husband also works from home.  Patient has been compliant with  her other medications as prescribed.  Patient denies any suicidality, homicidality or perceptual disturbances.     Visit Diagnosis:    ICD-10-CM   1. GAD (generalized anxiety disorder) F41.1   2. 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 Insomnia due to mental condition F51.05 Suvorexant (BELSOMRA) 15 MG TABS  4. Attention deficit hyperactivity disorder (ADHD), predominantly inattentive type F90.0 amphetamine-dextroamphetamine (ADDERALL XR) 20 MG 24 hr capsule    Past Psychiatric History: I have reviewed past psychiatric history from my progress note on 01/06/2018.  Past trials of Prozac-made her happy, Wellbutrin, Cymbalta.  Past Medical History:  Past Medical History:  Diagnosis Date  . ADHD (attention deficit hyperactivity disorder)   . Anxiety   . Depression     Past Surgical History:  Procedure Laterality Date  . TONSILLECTOMY      Family Psychiatric History: Reviewed family psychiatric history from my progress note on 01/06/2018.  Family History:  Family History  Problem Relation Age of Onset  . Anxiety disorder Mother   . Depression Mother   . Hypertension Mother   . Anxiety disorder Father   . Depression Father   . Hypertension Father   . Schizophrenia Paternal Grandmother   . Depression Paternal Grandmother   . Hearing loss Paternal Grandmother   . Hypertension Paternal Grandmother   . Depression Maternal Grandmother     Social History: I have reviewed social history from my progress  note on 01/06/2018. Social History   Socioeconomic History  . Marital status: Married    Spouse name: Tracey Lester  . Number of children: 3  . Years of education: Not on file  . Highest education level: Associate degree: occupational, Scientist, product/process developmenttechnical, or vocational program  Occupational History  . Not on file  Social Needs  . Financial resource strain: Not hard at all  . Food insecurity:    Worry: Never true    Inability: Never true  .  Transportation needs:    Medical: No    Non-medical: No  Tobacco Use  . Smoking status: Former Smoker    Types: Cigarettes    Last attempt to quit: 10/08/2006    Years since quitting: 12.4  . Smokeless tobacco: Never Used  Substance and Sexual Activity  . Alcohol use: Yes    Alcohol/week: 5.0 standard drinks    Types: 4 Glasses of wine, 1 Cans of beer per week  . Drug use: No  . Sexual activity: Yes    Birth control/protection: None  Lifestyle  . Physical activity:    Days per week: 4 days    Minutes per session: Not on file  . Stress: Very much  Relationships  . Social connections:    Talks on phone: More than three times a week    Gets together: More than three times a week    Attends religious service: More than 4 times per year    Active member of club or organization: No    Attends meetings of clubs or organizations: Never    Relationship status: Married  Other Topics Concern  . Not on file  Social History Narrative  . Not on file    Allergies:  Allergies  Allergen Reactions  . Codeine Itching    Metabolic Disorder Labs: No results found for: HGBA1C, MPG No results found for: PROLACTIN No results found for: CHOL, TRIG, HDL, CHOLHDL, VLDL, LDLCALC Lab Results  Component Value Date   TSH 2.59 08/09/2018    Therapeutic Level Labs: No results found for: LITHIUM No results found for: VALPROATE No components found for:  CBMZ  Current Medications: Current Outpatient Medications  Medication Sig Dispense Refill  . [START ON 04/23/2019] amphetamine-dextroamphetamine (ADDERALL XR) 20 MG 24 hr capsule Take 1 capsule (20 mg total) by mouth daily. 30 capsule 0  . amphetamine-dextroamphetamine (ADDERALL) 15 MG tablet Take 1 tablet by mouth daily. At noon 30 tablet 0  . busPIRone (BUSPAR) 7.5 MG tablet Take 1 tablet (7.5 mg total) by mouth 2 (two) times daily. 60 tablet 1  . clonazePAM (KLONOPIN) 0.5 MG tablet Take 1 tablet (0.5 mg total) by mouth daily as needed for  anxiety. 15 tablet 0  . dicyclomine (BENTYL) 10 MG capsule TAKE ONE CAPSULE BY MOUTH FOUR TIMES A DAY (BEFORE MEALS AND BEDTIME) 30 capsule 0  . hydrOXYzine (VISTARIL) 50 MG capsule Take 1 capsule (50 mg total) by mouth at bedtime as needed. For sleep (Patient not taking: Reported on 01/06/2019) 30 capsule 1  . loratadine (CLARITIN) 10 MG tablet Take 1 tablet (10 mg total) by mouth daily. 30 tablet 5  . meclizine (ANTIVERT) 25 MG tablet Take 1 tablet (25 mg total) by mouth 3 (three) times daily as needed for dizziness. 30 tablet 1  . sertraline (ZOLOFT) 100 MG tablet Take 2 tablets (200 mg total) by mouth daily. 60 tablet 2  . Suvorexant (BELSOMRA) 15 MG TABS Take 15 mg by mouth at bedtime. 30 tablet 1  No current facility-administered medications for this visit.      Musculoskeletal: Strength & Muscle Tone: within normal limits Gait & Station: normal Patient leans: N/A  Psychiatric Specialty Exam: Review of Systems  Psychiatric/Behavioral: The patient is nervous/anxious.   All other systems reviewed and are negative.   There were no vitals taken for this visit.There is no height or weight on file to calculate BMI.  General Appearance: Casual  Eye Contact:  Fair  Speech:  Normal Rate  Volume:  Normal  Mood:  Anxious  Affect:  Congruent  Thought Process:  Goal Directed and Descriptions of Associations: Intact  Orientation:  Full (Time, Place, and Person)  Thought Content: Logical   Suicidal Thoughts:  No  Homicidal Thoughts:  No  Memory:  Immediate;   Fair Recent;   Fair Remote;   Fair  Judgement:  Fair  Insight:  Fair  Psychomotor Activity:  Normal  Concentration:  Concentration: Fair and Attention Span: Fair  Recall:  [employer]Fair  Fund of Knowledge: Fair  Language: Fair  Akathisia:  No  Handed:  Right  AIMS (if indicated): denies tremors rigidity,stiffness  Assets:  Communication Skills Desire for Improvement Social Support  ADL's:  Intact  Cognition: WNL  Sleep:   restless at times   Screenings:   Assessment and Plan: Tracey ForestShirley is a 48 year old patient female who has a history of depression, anxiety, ADD was evaluated by telemedicine today.  Patient reports she is doing okay on the current medication regimen however does continue to struggle with sleep.  Discussed plan as noted below.  Plan For depression- improving Zoloft 200 mg p.o. daily BuSpar 7.5 mg p.o. twice daily  For anxiety-improving Zoloft as prescribed BuSpar 7.5 mg p.o. twice daily. She will continue to work on relaxation techniques, mindfulness and yoga.  For insomnia-restless We will increase Belsomra to 15 mg p.o. nightly Hydroxyzine 50 mg p.o. nightly as needed  For ADD- some progress Adderall extended release 20 mg p.o. daily every morning Adderall 15 mg after lunch. I have reviewed [place] controlled substance database. I have sent Adderall extended release an extra prescription to the pharmacy for end of June.  She will call the pharmacist to clarify her Adderall 15 mg prescription which was already sent out by writer end of May.  She will call back for future refills.  Follow-up in clinic in 6 to 8 weeks or sooner if needed.  August 5 at 2 PM  I have spent atleast 15 minutes non face to face with patient today. More than 50 % of the time was spent for psychoeducation and supportive psychotherapy and care coordination.  This note was generated in part or whole with voice recognition software. Voice recognition is usually quite accurate but there are transcription errors that can and very often do occur. I apologize for any typographical errors that were not detected and corrected.        Jomarie LongsSaramma Matvey Llanas, MD 04/05/2019, 3:58 PM

## 2019-04-07 ENCOUNTER — Ambulatory Visit: Payer: Managed Care, Other (non HMO) | Admitting: Gastroenterology

## 2019-04-19 ENCOUNTER — Telehealth: Payer: Self-pay | Admitting: Gastroenterology

## 2019-04-19 NOTE — Telephone Encounter (Signed)
I called pt to remind her of her appointment for 04-20-19. She has stated she is good & since she has been taken the probiotics Dr Marius Ditch had put her on. She will call if any problems.

## 2019-04-20 ENCOUNTER — Ambulatory Visit: Payer: Managed Care, Other (non HMO) | Admitting: Gastroenterology

## 2019-04-21 ENCOUNTER — Other Ambulatory Visit: Payer: Self-pay | Admitting: Psychiatry

## 2019-04-21 DIAGNOSIS — F411 Generalized anxiety disorder: Secondary | ICD-10-CM

## 2019-04-24 ENCOUNTER — Telehealth: Payer: Self-pay

## 2019-04-24 ENCOUNTER — Telehealth: Payer: Self-pay | Admitting: Psychiatry

## 2019-04-24 NOTE — Telephone Encounter (Signed)
pt called left a message that she needs something for panic attacks something to replace the clonazepam.

## 2019-04-24 NOTE — Telephone Encounter (Signed)
Called and left message to call back.

## 2019-05-05 ENCOUNTER — Telehealth: Payer: Self-pay

## 2019-05-05 DIAGNOSIS — F900 Attention-deficit hyperactivity disorder, predominantly inattentive type: Secondary | ICD-10-CM

## 2019-05-05 MED ORDER — AMPHETAMINE-DEXTROAMPHETAMINE 15 MG PO TABS: 15.0000 mg | ORAL_TABLET | Freq: Every day | ORAL | 0 refills | Status: DC

## 2019-05-05 MED ORDER — AMPHETAMINE-DEXTROAMPHET ER 20 MG PO CP24: 20.0000 mg | ORAL_CAPSULE | Freq: Every day | ORAL | 0 refills | Status: DC

## 2019-05-05 NOTE — Telephone Encounter (Signed)
pt called states she will not have enough medications to get to her next appt. espically the adderall.

## 2019-05-05 NOTE — Telephone Encounter (Signed)
sent Adderall to pharmacy.

## 2019-05-19 ENCOUNTER — Other Ambulatory Visit: Payer: Self-pay

## 2019-05-19 DIAGNOSIS — Z20822 Contact with and (suspected) exposure to covid-19: Secondary | ICD-10-CM

## 2019-05-22 ENCOUNTER — Telehealth: Payer: Self-pay | Admitting: General Practice

## 2019-05-22 LAB — NOVEL CORONAVIRUS, NAA: SARS-CoV-2, NAA: NOT DETECTED

## 2019-05-22 NOTE — Telephone Encounter (Signed)
Pt returned call  ° °Advised of Not Detected result.  °

## 2019-05-23 ENCOUNTER — Telehealth: Payer: Self-pay | Admitting: Lab

## 2019-05-23 NOTE — Telephone Encounter (Signed)
Error

## 2019-05-31 ENCOUNTER — Encounter: Payer: Self-pay | Admitting: Psychiatry

## 2019-05-31 ENCOUNTER — Other Ambulatory Visit: Payer: Self-pay | Admitting: Psychiatry

## 2019-05-31 ENCOUNTER — Ambulatory Visit (INDEPENDENT_AMBULATORY_CARE_PROVIDER_SITE_OTHER): Payer: 59 | Admitting: Psychiatry

## 2019-05-31 ENCOUNTER — Other Ambulatory Visit: Payer: Self-pay

## 2019-05-31 DIAGNOSIS — F900 Attention-deficit hyperactivity disorder, predominantly inattentive type: Secondary | ICD-10-CM | POA: Diagnosis not present

## 2019-05-31 DIAGNOSIS — F411 Generalized anxiety disorder: Secondary | ICD-10-CM

## 2019-05-31 DIAGNOSIS — F5105 Insomnia due to other mental disorder: Secondary | ICD-10-CM | POA: Diagnosis not present

## 2019-05-31 DIAGNOSIS — F330 Major depressive disorder, recurrent, mild: Secondary | ICD-10-CM

## 2019-05-31 DIAGNOSIS — F988 Other specified behavioral and emotional disorders with onset usually occurring in childhood and adolescence: Secondary | ICD-10-CM | POA: Insufficient documentation

## 2019-05-31 MED ORDER — AMPHETAMINE-DEXTROAMPHETAMINE 15 MG PO TABS: 15.0000 mg | ORAL_TABLET | Freq: Every day | ORAL | 0 refills | Status: DC

## 2019-05-31 MED ORDER — BELSOMRA 15 MG PO TABS: 15.0000 mg | ORAL_TABLET | Freq: Every day | ORAL | 1 refills | Status: DC

## 2019-05-31 MED ORDER — SERTRALINE HCL 100 MG PO TABS: 200.0000 mg | ORAL_TABLET | Freq: Every day | ORAL | 2 refills | Status: DC

## 2019-05-31 MED ORDER — BUSPIRONE HCL 15 MG PO TABS: 15.0000 mg | ORAL_TABLET | Freq: Two times a day (BID) | ORAL | 2 refills | Status: DC

## 2019-05-31 MED ORDER — AMPHETAMINE-DEXTROAMPHET ER 20 MG PO CP24: 20.0000 mg | ORAL_CAPSULE | Freq: Every day | ORAL | 0 refills | Status: DC

## 2019-05-31 NOTE — Progress Notes (Signed)
Virtual Visit via Video Note  I connected with Tracey Lester on 05/31/19 at  2:00 PM EDT by a video enabled telemedicine application and verified that I am speaking with the correct person using two identifiers.   I discussed the limitations of evaluation and management by telemedicine and the availability of in person appointments. The patient expressed understanding and agreed to proceed.   I discussed the assessment and treatment plan with the patient. The patient was provided an opportunity to ask questions and all were answered. The patient agreed with the plan and demonstrated an understanding of the instructions.   The patient was advised to call back or seek an in-person evaluation if the symptoms worsen or if the condition fails to improve as anticipated.   [facility] MD/PA/NP OP Progress Note  05/31/2019 5:02 PM Tracey Lester  MRN:  [id number]  Chief Complaint:  Chief Complaint    Follow-up     HPI: Tracey Lester is a 48 year old Caucasian female, married, lives in [place], has a history of GAD, MDD, ADHD was evaluated by telemedicine today.  Patient today reports she is currently doing well on the current medication regimen.  She reports she does not have any significant anxiety symptoms.  She continues to limit the use of the clonazepam.  She has started doing yoga again.  That has helped also.  She reports she increased her Buspar to 15 mg twice a day.  She reports that dosage seems to be working for her.  She reports sleep is good on the Belsomra.  She denies any side effects.  Patient continues to be compliant on the Adderall as prescribed.  She denies any side effects.  She reports appetite is fair.  Patient denies any suicidality, homicidality or perceptual disturbances.   Visit Diagnosis:    ICD-10-CM   1. GAD (generalized anxiety disorder)  F41.1 busPIRone (BUSPAR) 15 MG tablet  2. MDD (major depressive disorder), recurrent episode, mild ([facility])  F33.0 sertraline (ZOLOFT)  100 MG tablet  3. Insomnia due to mental condition  F51.05 Suvorexant (BELSOMRA) 15 MG TABS  4. Attention deficit hyperactivity disorder (ADHD), predominantly inattentive type  F90.0 amphetamine-dextroamphetamine (ADDERALL) 15 MG tablet    amphetamine-dextroamphetamine (ADDERALL XR) 20 MG 24 hr capsule    Past Psychiatric History: I have reviewed past psychiatric history from my progress note on 01/06/2018.  Past trials of Prozac-made her happy, Wellbutrin, Cymbalta.  Past Medical History:  Past Medical History:  Diagnosis Date  . ADHD (attention deficit hyperactivity disorder)   . Anxiety   . Depression     Past Surgical History:  Procedure Laterality Date  . TONSILLECTOMY      Family Psychiatric History: I have reviewed family psychiatric history from my progress note on 01/06/2018.  Family History:  Family History  Problem Relation Age of Onset  . Anxiety disorder Mother   . Depression Mother   . Hypertension Mother   . Anxiety disorder Father   . Depression Father   . Hypertension Father   . Schizophrenia Paternal Grandmother   . Depression Paternal Grandmother   . Hearing loss Paternal Grandmother   . Hypertension Paternal Grandmother   . Depression Maternal Grandmother     Social History: I have reviewed social history from my progress note on 01/06/2018. Social History   Socioeconomic History  . Marital status: Married    Spouse name: michael  . Number of children: 3  . Years of education: Not on file  . Highest education level: Associate degree: occupational,  technical, or vocational program  Occupational History  . Not on file  Social Needs  . Financial resource strain: Not hard at all  . Food insecurity    Worry: Never true    Inability: Never true  . Transportation needs    Medical: No    Non-medical: No  Tobacco Use  . Smoking status: Former Smoker    Types: Cigarettes    Quit date: 10/08/2006    Years since quitting: 12.6  . Smokeless tobacco:  Never Used  Substance and Sexual Activity  . Alcohol use: Yes    Alcohol/week: 5.0 standard drinks    Types: 4 Glasses of wine, 1 Cans of beer per week  . Drug use: No  . Sexual activity: Yes    Birth control/protection: None  Lifestyle  . Physical activity    Days per week: 4 days    Minutes per session: Not on file  . Stress: Very much  Relationships  . Social connections    Talks on phone: More than three times a week    Gets together: More than three times a week    Attends religious service: More than 4 times per year    Active member of club or organization: No    Attends meetings of clubs or organizations: Never    Relationship status: Married  Other Topics Concern  . Not on file  Social History Narrative  . Not on file    Allergies:  Allergies  Allergen Reactions  . Codeine Itching    Metabolic Disorder Labs: No results found for: HGBA1C, MPG No results found for: PROLACTIN No results found for: CHOL, TRIG, HDL, CHOLHDL, VLDL, LDLCALC Lab Results  Component Value Date   TSH 2.59 08/09/2018    Therapeutic Level Labs: No results found for: LITHIUM No results found for: VALPROATE No components found for:  CBMZ  Current Medications: Current Outpatient Medications  Medication Sig Dispense Refill  . [START ON 06/24/2019] amphetamine-dextroamphetamine (ADDERALL XR) 20 MG 24 hr capsule Take 1 capsule (20 mg total) by mouth daily. 30 capsule 0  . [START ON 06/03/2019] amphetamine-dextroamphetamine (ADDERALL) 15 MG tablet Take 1 tablet by mouth daily. At noon 30 tablet 0  . busPIRone (BUSPAR) 15 MG tablet Take 1 tablet (15 mg total) by mouth 2 (two) times daily. 60 tablet 2  . clonazePAM (KLONOPIN) 0.5 MG tablet TAKE ONE TABLET BY MOUTH DAILY AS NEEDED FOR ANXIETY 15 tablet 0  . dicyclomine (BENTYL) 10 MG capsule TAKE ONE CAPSULE BY MOUTH FOUR TIMES A DAY (BEFORE MEALS AND BEDTIME) 30 capsule 0  . hydrOXYzine (VISTARIL) 50 MG capsule Take 1 capsule (50 mg total) by  mouth at bedtime as needed. For sleep (Patient not taking: Reported on 01/06/2019) 30 capsule 1  . loratadine (CLARITIN) 10 MG tablet Take 1 tablet (10 mg total) by mouth daily. 30 tablet 5  . meclizine (ANTIVERT) 25 MG tablet Take 1 tablet (25 mg total) by mouth 3 (three) times daily as needed for dizziness. 30 tablet 1  . sertraline (ZOLOFT) 100 MG tablet Take 2 tablets (200 mg total) by mouth daily. 60 tablet 2  . Suvorexant (BELSOMRA) 15 MG TABS Take 15 mg by mouth at bedtime. 30 tablet 1   No current facility-administered medications for this visit.      Musculoskeletal: Strength & Muscle Tone: UTA Gait & Station: normal Patient leans: N/A  Psychiatric Specialty Exam: Review of Systems  Psychiatric/Behavioral: The patient is not nervous/anxious.   All other  systems reviewed and are negative.   There were no vitals taken for this visit.There is no height or weight on file to calculate BMI.  General Appearance: Casual  Eye Contact:  Fair  Speech:  Clear and Coherent  Volume:  Normal  Mood:  Anxious  Affect:  Congruent  Thought Process:  Goal Directed and Descriptions of Associations: Intact  Orientation:  Full (Time, Place, and Person)  Thought Content: Logical   Suicidal Thoughts:  No  Homicidal Thoughts:  No  Memory:  Immediate;   Fair Recent;   Fair Remote;   Fair  Judgement:  Fair  Insight:  Fair  Psychomotor Activity:  Normal  Concentration:  Concentration: Fair and Attention Span: Fair  Recall:  [employer]Fair  Fund of Knowledge: Fair  Language: Fair  Akathisia:  No  Handed:  Right  AIMS (if indicated): denies tremors, rigidity  Assets:  Communication Skills Desire for Improvement Social Support  ADL's:  Intact  Cognition: WNL  Sleep:  Fair   Screenings:   Assessment and Plan: Tracey Lester is a 48 year old Caucasian female who has a history of depression, anxiety, ADD was evaluated by telemedicine today.  Patient continues to reports improvement in her anxiety  symptoms.  Continue plan as noted below.  Plan For depression- improving Zoloft 200 mg p.o. daily BuSpar 15 mg p.o. twice daily.  For anxiety-improving Zoloft as prescribed BuSpar 15 mg p.o. twice daily She will continue to work on relaxation techniques, mindfulness and yoga.  For insomnia-improving Belsomra 15 mg p.o. nightly Hydroxyzine 50 mg p.o. nightly as needed  For ADD-improving Adderall extended release 20 mg p.o. daily every morning- to be filled 8/29 Adderall 15 mg after lunch- 8/8 I have reviewed [place] controlled substance database.  Follow-up in clinic in 6 to 8 weeks or sooner if needed.  September 17 at 2:30 PM  I have spent atleast 15 minutes non face to face with patient today. More than 50 % of the time was spent for psychoeducation and supportive psychotherapy and care coordination.  This note was generated in part or whole with voice recognition software. Voice recognition is usually quite accurate but there are transcription errors that can and very often do occur. I apologize for any typographical errors that were not detected and corrected.        Jomarie LongsSaramma Delle Andrzejewski, MD 05/31/2019, 5:02 PM

## 2019-07-10 ENCOUNTER — Telehealth: Payer: Self-pay

## 2019-07-10 DIAGNOSIS — F900 Attention-deficit hyperactivity disorder, predominantly inattentive type: Secondary | ICD-10-CM

## 2019-07-10 MED ORDER — AMPHETAMINE-DEXTROAMPHETAMINE 15 MG PO TABS: 15.0000 mg | ORAL_TABLET | Freq: Every day | ORAL | 0 refills | Status: DC

## 2019-07-10 MED ORDER — AMPHETAMINE-DEXTROAMPHET ER 20 MG PO CP24: 20.0000 mg | ORAL_CAPSULE | Freq: Every day | ORAL | 0 refills | Status: DC

## 2019-07-10 NOTE — Telephone Encounter (Signed)
I have sent Adderall extended release and Adderall immediate release with date specified.

## 2019-07-10 NOTE — Telephone Encounter (Signed)
pt called an requested a refill on her adderall she wanted to know if you can go ahead and do both so she can get on same schedule.

## 2019-07-13 ENCOUNTER — Encounter: Payer: Self-pay | Admitting: Psychiatry

## 2019-07-13 ENCOUNTER — Ambulatory Visit (INDEPENDENT_AMBULATORY_CARE_PROVIDER_SITE_OTHER): Payer: 59 | Admitting: Psychiatry

## 2019-07-13 ENCOUNTER — Other Ambulatory Visit: Payer: Self-pay

## 2019-07-13 DIAGNOSIS — F3341 Major depressive disorder, recurrent, in partial remission: Secondary | ICD-10-CM

## 2019-07-13 DIAGNOSIS — F900 Attention-deficit hyperactivity disorder, predominantly inattentive type: Secondary | ICD-10-CM | POA: Diagnosis not present

## 2019-07-13 DIAGNOSIS — F411 Generalized anxiety disorder: Secondary | ICD-10-CM

## 2019-07-13 DIAGNOSIS — F5105 Insomnia due to other mental disorder: Secondary | ICD-10-CM | POA: Diagnosis not present

## 2019-07-13 MED ORDER — BELSOMRA 15 MG PO TABS: 15.0000 mg | ORAL_TABLET | Freq: Every day | ORAL | 2 refills | Status: DC

## 2019-07-13 MED ORDER — CLONAZEPAM 0.5 MG PO TABS: 0.5000 mg | ORAL_TABLET | ORAL | 1 refills | Status: DC

## 2019-07-13 NOTE — Progress Notes (Signed)
Virtual Visit via Telephone Note  I connected with Tracey Lester on 07/13/19 at  2:30 PM EDT by telephone and verified that I am speaking with the correct person using two identifiers.   I discussed the limitations, risks, security and privacy concerns of performing an evaluation and management service by telephone and the availability of in person appointments. I also discussed with the patient that there may be a patient responsible charge related to this service. The patient expressed understanding and agreed to proceed.     I discussed the assessment and treatment plan with the patient. The patient was provided an opportunity to ask questions and all were answered. The patient agreed with the plan and demonstrated an understanding of the instructions.   The patient was advised to call back or seek an in-person evaluation if the symptoms worsen or if the condition fails to improve as anticipated.   [facility] MD OP Progress Note  07/13/2019 4:58 PM Tanyka Alders  MRN:  [id number]  Chief Complaint:  Chief Complaint    Follow-up     HPI: Adeola is a 48 year old Caucasian female, married, lives in [place], has a history of GAD, MDD, ADHD was evaluated by telemedicine today.  Patient today reports that she wants to do a phone call and declined video call.   Patient today reports she is currently doing better with her anxiety symptoms.  She denies any significant depression at this time.  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  Recently she was able to do some public speaking which she has never been able to do before.  She reports she initially had some panic symptoms but she did not have the full-blown panic attack that she had in the past.  She reports she has been sleeping good on the Belsomra.  She is currently doing well on the Adderall.  She denies  any side effects.  She reports her appetite is fair.  Patient denies any suicidality, homicidality or perceptual disturbances.  Patient reports she will still want to have a limited supply of Klonopin so that she can use it as a rescue medication when she has a full-blown panic attack.   Visit Diagnosis:    ICD-10-CM   1. GAD (generalized anxiety disorder)  F41.1   2. MDD (major depressive disorder), recurrent, in partial remission (HCC)  F33.41   3. Insomnia due to mental condition  F51.05 Suvorexant (BELSOMRA) 15 MG TABS  4. Attention deficit hyperactivity disorder (ADHD), predominantly inattentive type  F90.0     Past Psychiatric History: I have reviewed past psychiatric history from my progress note on 01/06/2018.  Past trials of Prozac-made her happy, Wellbutrin, Cymbalta  Past Medical History:  Past Medical History:  Diagnosis Date  . ADHD (attention deficit hyperactivity disorder)   . Anxiety   . Depression     Past Surgical History:  Procedure Laterality Date  . TONSILLECTOMY      Family Psychiatric History: I have reviewed family psychiatric history from my progress note on 01/06/2018  Family History:  Family History  Problem Relation Age of Onset  . Anxiety disorder Mother   . Depression Mother   . Hypertension Mother   . Anxiety disorder Father   . Depression Father   . Hypertension Father   . Schizophrenia Paternal Grandmother   . Depression Paternal Grandmother   .  Hearing loss Paternal Grandmother   . Hypertension Paternal Grandmother   . Depression Maternal Grandmother     Social History: I have reviewed social history from my progress note on 01/06/2018 Social History   Socioeconomic History  . Marital status: Married    Spouse name: michael  . Number of children: 3  . Years of education: Not on file  . Highest education level: Associate degree: occupational, Scientist, product/process developmenttechnical, or vocational program  Occupational History  . Not on file  Social Needs   . Financial resource strain: Not hard at all  . Food insecurity    Worry: Never true    Inability: Never true  . Transportation needs    Medical: No    Non-medical: No  Tobacco Use  . Smoking status: Former Smoker    Types: Cigarettes    Quit date: 10/08/2006    Years since quitting: 12.7  . Smokeless tobacco: Never Used  Substance and Sexual Activity  . Alcohol use: Yes    Alcohol/week: 5.0 standard drinks    Types: 4 Glasses of wine, 1 Cans of beer per week  . Drug use: No  . Sexual activity: Yes    Birth control/protection: None  Lifestyle  . Physical activity    Days per week: 4 days    Minutes per session: Not on file  . Stress: Very much  Relationships  . Social connections    Talks on phone: More than three times a week    Gets together: More than three times a week    Attends religious service: More than 4 times per year    Active member of club or organization: No    Attends meetings of clubs or organizations: Never    Relationship status: Married  Other Topics Concern  . Not on file  Social History Narrative  . Not on file    Allergies:  Allergies  Allergen Reactions  . Codeine Itching    Metabolic Disorder Labs: No results found for: HGBA1C, MPG No results found for: PROLACTIN No results found for: CHOL, TRIG, HDL, CHOLHDL, VLDL, LDLCALC Lab Results  Component Value Date   TSH 2.59 08/09/2018    Therapeutic Level Labs: No results found for: LITHIUM No results found for: VALPROATE No components found for:  CBMZ  Current Medications: Current Outpatient Medications  Medication Sig Dispense Refill  . [START ON 08/07/2019] amphetamine-dextroamphetamine (ADDERALL XR) 20 MG 24 hr capsule Take 1 capsule (20 mg total) by mouth daily. 30 capsule 0  . amphetamine-dextroamphetamine (ADDERALL) 15 MG tablet Take 1 tablet by mouth daily. At noon 30 tablet 0  . busPIRone (BUSPAR) 15 MG tablet Take 1 tablet (15 mg total) by mouth 2 (two) times daily. 60  tablet 2  . clonazePAM (KLONOPIN) 0.5 MG tablet Take 1 tablet (0.5 mg total) by mouth as directed. Take it once a day as needed only for severe panic attacks 10 tablet 1  . dicyclomine (BENTYL) 10 MG capsule TAKE ONE CAPSULE BY MOUTH FOUR TIMES A DAY (BEFORE MEALS AND BEDTIME) 30 capsule 0  . hydrOXYzine (VISTARIL) 50 MG capsule Take 1 capsule (50 mg total) by mouth at bedtime as needed. For sleep (Patient not taking: Reported on 01/06/2019) 30 capsule 1  . loratadine (CLARITIN) 10 MG tablet Take 1 tablet (10 mg total) by mouth daily. 30 tablet 5  . meclizine (ANTIVERT) 25 MG tablet Take 1 tablet (25 mg total) by mouth 3 (three) times daily as needed for dizziness. 30 tablet 1  .  sertraline (ZOLOFT) 100 MG tablet Take 2 tablets (200 mg total) by mouth daily. 60 tablet 2  . [START ON 08/06/2019] Suvorexant (BELSOMRA) 15 MG TABS Take 15 mg by mouth at bedtime. 30 tablet 2   No current facility-administered medications for this visit.      Musculoskeletal: Strength & Muscle Tone: UTA Gait & Station: Reports as WNL Patient leans: N/A  Psychiatric Specialty Exam: Review of Systems  Psychiatric/Behavioral: The patient is nervous/anxious.   All other systems reviewed and are negative.   There were no vitals taken for this visit.There is no height or weight on file to calculate BMI.  General Appearance: UTA  Eye Contact:  UTA  Speech:  Clear and Coherent  Volume:  Normal  Mood:  Anxious improving  Affect:  UTA  Thought Process:  Goal Directed and Descriptions of Associations: Intact  Orientation:  Full (Time, Place, and Person)  Thought Content: Logical   Suicidal Thoughts:  No  Homicidal Thoughts:  No  Memory:  Immediate;   Fair Recent;   Fair Remote;   Fair  Judgement:  Fair  Insight:  Fair  Psychomotor Activity:  UTA  Concentration:  Concentration: Fair and Attention Span: Fair  Recall:  [employer] of Knowledge: Fair  Language: Fair  Akathisia:  No  Handed:  Right  AIMS (if  indicated): Denies tremors, rigidity  Assets:  Communication Skills Desire for Improvement Social Support  ADL's:  Intact  Cognition: WNL  Sleep:  Fair   Screenings:   Assessment and Plan: Dajah is a 48 year old Caucasian female who has a history of depression, anxiety, ADD was evaluated by telemedicine today.  Patient is currently making progress on the current medication regimen.  Plan as noted below.  Plan For depression in remission Zoloft 200 mg p.o. daily BuSpar 15 mg p.o. twice daily  For anxiety-stable Zoloft as prescribed BuSpar 15 mg p.o. twice daily She will continue to work on relaxation techniques, mindfulness and yoga. We will provide clonazepam 0.5 mg - 10 pill- for full-blown panic attacks only.  She is aware to limit use.  For insomnia-stable Belsomra 15 mg p.o. nightly Hydroxyzine 50 mg p.o. nightly as needed  ADD-improving Adderall XR 20 mg p.o. daily every morning. Adderall 15 mg p.o. daily after lunch. I have reviewed [place] controlled substance database.  Follow-up in clinic in 2 to 3 months or sooner if needed.  December 16 at 4 PM  I have spent atleast 15 minutes non  face to face with patient today. More than 50 % of the time was spent for psychoeducation and supportive psychotherapy and care coordination. This note was generated in part or whole with voice recognition software. Voice recognition is usually quite accurate but there are transcription errors that can and very often do occur. I apologize for any typographical errors that were not detected and corrected.      Ursula Alert, MD 07/13/2019, 4:58 PM

## 2019-07-31 ENCOUNTER — Telehealth: Payer: Self-pay

## 2019-07-31 DIAGNOSIS — F900 Attention-deficit hyperactivity disorder, predominantly inattentive type: Secondary | ICD-10-CM

## 2019-07-31 MED ORDER — AMPHETAMINE-DEXTROAMPHET ER 20 MG PO CP24: 20.0000 mg | ORAL_CAPSULE | Freq: Every day | ORAL | 0 refills | Status: DC

## 2019-07-31 NOTE — Telephone Encounter (Signed)
last rx she can get  06-29-19 and she is totally out.  she states the other rx she can not get until the 12th. but what does she do in the meentime.

## 2019-07-31 NOTE — Telephone Encounter (Signed)
Sent Adderall xr to pharmacy

## 2019-08-01 ENCOUNTER — Telehealth: Payer: Self-pay | Admitting: Psychiatry

## 2019-08-01 DIAGNOSIS — F900 Attention-deficit hyperactivity disorder, predominantly inattentive type: Secondary | ICD-10-CM

## 2019-08-01 DIAGNOSIS — F908 Attention-deficit hyperactivity disorder, other type: Secondary | ICD-10-CM

## 2019-08-01 MED ORDER — AMPHETAMINE-DEXTROAMPHETAMINE 15 MG PO TABS: 15.0000 mg | ORAL_TABLET | Freq: Every day | ORAL | 0 refills | Status: DC

## 2019-08-01 MED ORDER — AMPHETAMINE-DEXTROAMPHET ER 20 MG PO CP24: 20.0000 mg | ORAL_CAPSULE | Freq: Every day | ORAL | 0 refills | Status: DC

## 2019-08-01 NOTE — Telephone Encounter (Signed)
pharmacy called and they states that pt is having a hard time to get medication that the rx sent yesterday was they same as the one before that it has not to fill until 08-07-19 .

## 2019-08-01 NOTE — Telephone Encounter (Signed)
Sent Adderall XR 20 mg to be filled today. Adderall 15 mg to be filled on or after 08/09/2019

## 2019-08-01 NOTE — Telephone Encounter (Signed)
pt called states she needs to find out why you will not given her enough medication to do until her next appt. she states you sent the same rx over please call her to let her know why

## 2019-09-18 ENCOUNTER — Telehealth: Payer: Self-pay

## 2019-09-18 DIAGNOSIS — F411 Generalized anxiety disorder: Secondary | ICD-10-CM

## 2019-09-18 DIAGNOSIS — F908 Attention-deficit hyperactivity disorder, other type: Secondary | ICD-10-CM

## 2019-09-18 DIAGNOSIS — F900 Attention-deficit hyperactivity disorder, predominantly inattentive type: Secondary | ICD-10-CM

## 2019-09-18 DIAGNOSIS — F330 Major depressive disorder, recurrent, mild: Secondary | ICD-10-CM

## 2019-09-18 MED ORDER — AMPHETAMINE-DEXTROAMPHET ER 20 MG PO CP24: 20.0000 mg | ORAL_CAPSULE | Freq: Every day | ORAL | 0 refills | Status: DC

## 2019-09-18 MED ORDER — BUSPIRONE HCL 15 MG PO TABS: 15.0000 mg | ORAL_TABLET | Freq: Two times a day (BID) | ORAL | 2 refills | Status: DC

## 2019-09-18 MED ORDER — CLONAZEPAM 0.5 MG PO TABS: 0.5000 mg | ORAL_TABLET | ORAL | 1 refills | Status: DC

## 2019-09-18 MED ORDER — AMPHETAMINE-DEXTROAMPHETAMINE 15 MG PO TABS: 15.0000 mg | ORAL_TABLET | Freq: Every day | ORAL | 0 refills | Status: DC

## 2019-09-18 MED ORDER — SERTRALINE HCL 100 MG PO TABS: 200.0000 mg | ORAL_TABLET | Freq: Every day | ORAL | 2 refills | Status: DC

## 2019-09-18 NOTE — Telephone Encounter (Signed)
pt called states she needs a refill on her medications espically the adderalls 15 and 20

## 2019-09-18 NOTE — Telephone Encounter (Signed)
I have sent all her medications to the pharmacy with date specified.  She however does have Belsomra-pending prescription at the pharmacy.

## 2019-10-11 ENCOUNTER — Ambulatory Visit: Payer: 59 | Admitting: Psychiatry

## 2019-10-18 ENCOUNTER — Telehealth: Payer: Self-pay

## 2019-10-18 DIAGNOSIS — F900 Attention-deficit hyperactivity disorder, predominantly inattentive type: Secondary | ICD-10-CM

## 2019-10-18 DIAGNOSIS — F5105 Insomnia due to other mental disorder: Secondary | ICD-10-CM

## 2019-10-18 DIAGNOSIS — F908 Attention-deficit hyperactivity disorder, other type: Secondary | ICD-10-CM

## 2019-10-18 MED ORDER — AMPHETAMINE-DEXTROAMPHETAMINE 15 MG PO TABS: 15.0000 mg | ORAL_TABLET | Freq: Every day | ORAL | 0 refills | Status: DC

## 2019-10-18 NOTE — Telephone Encounter (Signed)
Medication management - Message left for pt Dr Pricilla Larsson sent in a weeks order for her Addreall 15 mg and requested she call back in the coming week to request longer from Dr. Shea Evans and to reschedule missed appointment.

## 2019-10-18 NOTE — Telephone Encounter (Signed)
Medication refill request - Message left for pt her message was received and refill request for Adderall 15 mg regular and that she needs to reschedule missed appt.  Requested pt call back to schedule appt. and informed would send request to covering MD to see if they would be willing to refill pt's Adderall 15 mg.  Informed Dr. Shea Evans would be back in the office on 10/23/19.

## 2019-10-18 NOTE — Telephone Encounter (Signed)
I sent rx for Adderall IR 15 mg x 7 days to cover for a week, and would recommend her to call back next week and schedule appointment with Dr. Shea Evans.

## 2019-10-23 MED ORDER — AMPHETAMINE-DEXTROAMPHET ER 20 MG PO CP24: 20.0000 mg | ORAL_CAPSULE | Freq: Every day | ORAL | 0 refills | Status: DC

## 2019-10-23 MED ORDER — AMPHETAMINE-DEXTROAMPHETAMINE 15 MG PO TABS: 15.0000 mg | ORAL_TABLET | Freq: Every day | ORAL | 0 refills | Status: DC

## 2019-10-23 MED ORDER — BELSOMRA 15 MG PO TABS: 15.0000 mg | ORAL_TABLET | Freq: Every day | ORAL | 0 refills | Status: DC

## 2019-10-23 NOTE — Telephone Encounter (Signed)
Pt was called and appt was given for January  2021

## 2019-10-23 NOTE — Telephone Encounter (Signed)
I have sent Adderall XR, Adderall immediate release and Belsomra to pharmacy.  I have reviewed [place] controlled substance database.  Patient missed her last appointment with writer and needs a follow-up session scheduled.

## 2019-10-23 NOTE — Addendum Note (Signed)
Addended byUrsula Alert on: 10/23/2019 12:42 PM   Modules accepted: Orders

## 2019-11-02 ENCOUNTER — Ambulatory Visit (INDEPENDENT_AMBULATORY_CARE_PROVIDER_SITE_OTHER): Payer: 59 | Admitting: Psychiatry

## 2019-11-02 ENCOUNTER — Encounter: Payer: Self-pay | Admitting: Psychiatry

## 2019-11-02 ENCOUNTER — Other Ambulatory Visit: Payer: Self-pay

## 2019-11-02 DIAGNOSIS — F3342 Major depressive disorder, recurrent, in full remission: Secondary | ICD-10-CM | POA: Diagnosis not present

## 2019-11-02 DIAGNOSIS — F5105 Insomnia due to other mental disorder: Secondary | ICD-10-CM | POA: Diagnosis not present

## 2019-11-02 DIAGNOSIS — F411 Generalized anxiety disorder: Secondary | ICD-10-CM

## 2019-11-02 DIAGNOSIS — F900 Attention-deficit hyperactivity disorder, predominantly inattentive type: Secondary | ICD-10-CM

## 2019-11-02 NOTE — Progress Notes (Signed)
Virtual Visit via Video Note  I connected with Tracey Lester on 11/02/19 at  2:40 PM EST by a video enabled telemedicine application and verified that I am speaking with the correct person using two identifiers.   I discussed the limitations of evaluation and management by telemedicine and the availability of in person appointments. The patient expressed understanding and agreed to proceed.     I discussed the assessment and treatment plan with the patient. The patient was provided an opportunity to ask questions and all were answered. The patient agreed with the plan and demonstrated an understanding of the instructions.   The patient was advised to call back or seek an in-person evaluation if the symptoms worsen or if the condition fails to improve as anticipated.   [facility] MD OP Progress Note  11/02/2019 6:09 PM Tracey Lester  MRN:  [id number]  Chief Complaint:  Chief Complaint    Follow-up     HPI: Tracey Lester is a 49 year old Caucasian female, married, lives in [place], has a history of GAD, MDD, ADHD was evaluated by telemedicine today.  Patient today reports she is currently doing well with regards to her anxiety symptoms.  She denies any significant panic attacks.  She reports sleep is good.  She is compliant on her medications as prescribed.  She reports her concentration is good on the Adderall.  She denies side effects.  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  She reports she also continues to do meditation herself.  She denies any other concerns today. Visit Diagnosis:    ICD-10-CM   1. GAD (generalized anxiety disorder)  F41.1   2. MDD (major depressive disorder), recurrent, in full remission ([place])  F33.42   3. Insomnia due to mental condition  F51.05   4. Attention deficit hyperactivity disorder (ADHD), predominantly inattentive type  F90.0     Past Psychiatric History: I have reviewed past psychiatric history from my progress note on 3/14/ 2019.   Past trials of Prozac-made her happy, Wellbutrin, Cymbalta.  Past Medical History:  Past Medical History:  Diagnosis Date  . ADHD (attention deficit hyperactivity disorder)   . Anxiety   . Depression     Past Surgical History:  Procedure Laterality Date  . TONSILLECTOMY      Family Psychiatric History: Reviewed family psychiatric history from my progress note on 01/06/2018.  Family History:  Family History  Problem Relation Age of Onset  . Anxiety disorder Mother   . Depression Mother   . Hypertension Mother   . Anxiety disorder Father   . Depression Father   . Hypertension Father   . Schizophrenia Paternal Grandmother   . Depression Paternal Grandmother   . Hearing loss Paternal Grandmother   . Hypertension Paternal Grandmother   . Depression Maternal Grandmother     Social History: Reviewed social history from my progress note on 01/06/2018. Social History   Socioeconomic History  . Marital status: Married    Spouse name: Tracey Lester  . Number of children: 3  . Years of education: Not on file  . Highest education level: Associate degree: occupational, Hotel manager, or vocational program  Occupational History  . Not on file  Tobacco Use  . Smoking status: Former Smoker    Types: Cigarettes    Quit date: 10/08/2006    Years since quitting: 13.0  . Smokeless tobacco: Never Used  Substance and Sexual Activity  . Alcohol use: Yes    Alcohol/week: 5.0 standard drinks    Types: 4  Glasses of wine, 1 Cans of beer per week  . Drug use: No  . Sexual activity: Yes    Birth control/protection: None  Other Topics Concern  . Not on file  Social History Narrative  . Not on file   Social Determinants of Health   Financial Resource Strain:   . Difficulty of Paying Living Expenses: Not on file  Food Insecurity:   . Worried About Programme researcher, broadcasting/film/video in the Last Year: Not on file  . Ran Out of Food in the Last Year: Not on file  Transportation Needs:   . Lack of  Transportation (Medical): Not on file  . Lack of Transportation (Non-Medical): Not on file  Physical Activity:   . Days of Exercise per Week: Not on file  . Minutes of Exercise per Session: Not on file  Stress:   . Feeling of Stress : Not on file  Social Connections:   . Frequency of Communication with Friends and Family: Not on file  . Frequency of Social Gatherings with Friends and Family: Not on file  . Attends Religious Services: Not on file  . Active Member of Clubs or Organizations: Not on file  . Attends Banker Meetings: Not on file  . Marital Status: Not on file    Allergies:  Allergies  Allergen Reactions  . Codeine Itching    Metabolic Disorder Labs: No results found for: HGBA1C, MPG No results found for: PROLACTIN No results found for: CHOL, TRIG, HDL, CHOLHDL, VLDL, LDLCALC Lab Results  Component Value Date   TSH 2.59 08/09/2018    Therapeutic Level Labs: No results found for: LITHIUM No results found for: VALPROATE No components found for:  CBMZ  Current Medications: Current Outpatient Medications  Medication Sig Dispense Refill  . amphetamine-dextroamphetamine (ADDERALL XR) 20 MG 24 hr capsule Take 1 capsule (20 mg total) by mouth daily. 30 capsule 0  . amphetamine-dextroamphetamine (ADDERALL) 15 MG tablet Take 1 tablet by mouth daily. At noon 30 tablet 0  . busPIRone (BUSPAR) 15 MG tablet Take 1 tablet (15 mg total) by mouth 2 (two) times daily. 60 tablet 2  . clonazePAM (KLONOPIN) 0.5 MG tablet Take 1 tablet (0.5 mg total) by mouth as directed. Take it once a day as needed only for severe panic attacks 10 tablet 1  . dicyclomine (BENTYL) 10 MG capsule TAKE ONE CAPSULE BY MOUTH FOUR TIMES A DAY (BEFORE MEALS AND BEDTIME) 30 capsule 0  . hydrOXYzine (VISTARIL) 50 MG capsule Take 1 capsule (50 mg total) by mouth at bedtime as needed. For sleep (Patient not taking: Reported on 01/06/2019) 30 capsule 1  . loratadine (CLARITIN) 10 MG tablet Take 1  tablet (10 mg total) by mouth daily. 30 tablet 5  . meclizine (ANTIVERT) 25 MG tablet Take 1 tablet (25 mg total) by mouth 3 (three) times daily as needed for dizziness. 30 tablet 1  . sertraline (ZOLOFT) 100 MG tablet Take 2 tablets (200 mg total) by mouth daily. 60 tablet 2  . [START ON 11/03/2019] Suvorexant (BELSOMRA) 15 MG TABS Take 15 mg by mouth at bedtime. 30 tablet 0   No current facility-administered medications for this visit.     Musculoskeletal: Strength & Muscle Tone: UTA Gait & Station: normal Patient leans: N/A  Psychiatric Specialty Exam: Review of Systems  Psychiatric/Behavioral: Negative for agitation, behavioral problems, confusion, decreased concentration, dysphoric mood, hallucinations, self-injury, sleep disturbance and suicidal ideas. The patient is not nervous/anxious and is not hyperactive.  All other systems reviewed and are negative.   There were no vitals taken for this visit.There is no height or weight on file to calculate BMI.  General Appearance: Casual  Eye Contact:  Fair  Speech:  Normal Rate  Volume:  Normal  Mood:  Euthymic  Affect:  Congruent  Thought Process:  Goal Directed and Descriptions of Associations: Intact  Orientation:  Full (Time, Place, and Person)  Thought Content: Logical   Suicidal Thoughts:  No  Homicidal Thoughts:  No  Memory:  Immediate;   Fair Recent;   Fair Remote;   Fair  Judgement:  Fair  Insight:  Fair  Psychomotor Activity:  Normal  Concentration:  Concentration: Fair and Attention Span: Fair  Recall:  [employer] of Knowledge: Fair  Language: Fair  Akathisia:  No  Handed:  Right  AIMS (if indicated): Denies tremors, rigidity  Assets:  Communication Skills Desire for Improvement Housing Social Support  ADL's:  Intact  Cognition: WNL  Sleep:  Fair   Screenings:   Assessment and Plan: Trish is a 49 year old Caucasian female who has a history of depression, anxiety, ADD was evaluated by telemedicine  today.  She is currently doing well on the current medication regimen.  Plan as noted below.  Plan For depression in remission Zoloft 200 mg p.o. daily BuSpar 15 mg p.o. twice daily  For anxiety-stable Zoloft and BuSpar as prescribed Clonazepam 0.5 mg as needed for severe panic attacks only-we will provide her with 10 pills.  Patient is aware to limit use She will continue medication, relaxation techniques.  Insomnia-stable Belsomra 15 mg p.o. nightly-she has been limiting use. Hydroxyzine 50 mg p.o. nightly as needed  ADD-stable Adderall extended release 20 mg p.o. daily every morning Adderall 15 mg p.o. daily after lunch. I have reviewed [place] controlled substance database.  Follow-up in clinic in 3 months or sooner if needed.  March 30 at 1 PM  I have spent atleast 20 minutes non face to face with patient today. More than 50 % of the time was spent for ordering medications and test ,psychoeducation and supportive psychotherapy and care coordination,as well as documenting clinical information in electronic health record. This note was generated in part or whole with voice recognition software. Voice recognition is usually quite accurate but there are transcription errors that can and very often do occur. I apologize for any typographical errors that were not detected and corrected.       Jomarie Longs, MD 11/02/2019, 6:09 PM

## 2019-11-08 ENCOUNTER — Telehealth: Payer: Self-pay | Admitting: Psychiatry

## 2019-11-08 DIAGNOSIS — F900 Attention-deficit hyperactivity disorder, predominantly inattentive type: Secondary | ICD-10-CM

## 2019-11-08 DIAGNOSIS — F908 Attention-deficit hyperactivity disorder, other type: Secondary | ICD-10-CM

## 2019-11-08 MED ORDER — AMPHETAMINE-DEXTROAMPHET ER 20 MG PO CP24: 20.0000 mg | ORAL_CAPSULE | Freq: Every day | ORAL | 0 refills | Status: DC

## 2019-11-08 MED ORDER — AMPHETAMINE-DEXTROAMPHETAMINE 15 MG PO TABS: 15.0000 mg | ORAL_TABLET | Freq: Every day | ORAL | 0 refills | Status: DC

## 2019-11-08 NOTE — Telephone Encounter (Signed)
I have sent Adderall extended release 20 mg and immediate release 15 mg to pharmacy-3 prescriptions each-with date specified-last 1 to be filled on or after 01/29/2020

## 2019-11-19 IMAGING — US ULTRASOUND ABDOMEN COMPLETE
1 series · 14 of 25 positions shown · non-contrast
Comparison: None.

CLINICAL DATA: Abdominal pain for several months.

EXAM:
ABDOMEN ULTRASOUND COMPLETE

[Series 1: ultrasound abdomen complete · 0.19mm/px · 14 of 117 slices shown]
[im 1/117]
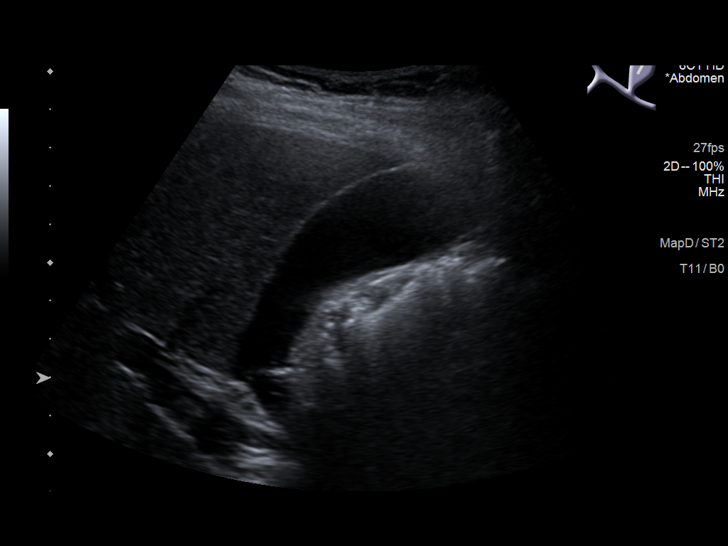
[im 10/117]
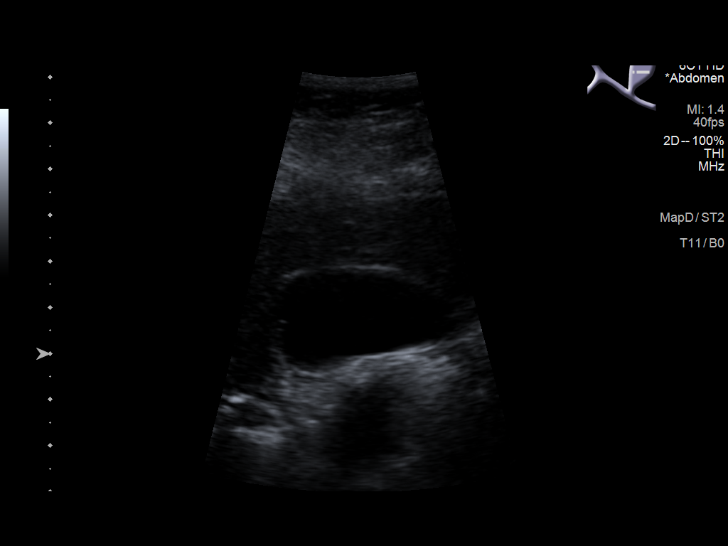
[im 20/117]
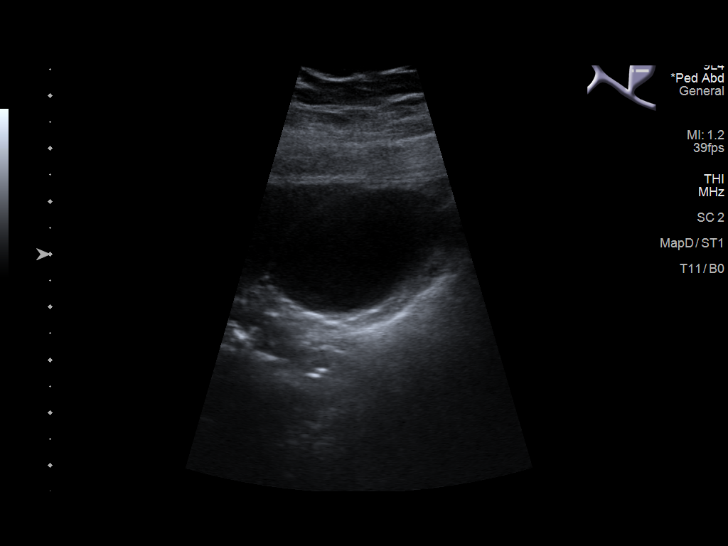
[im 30/117]
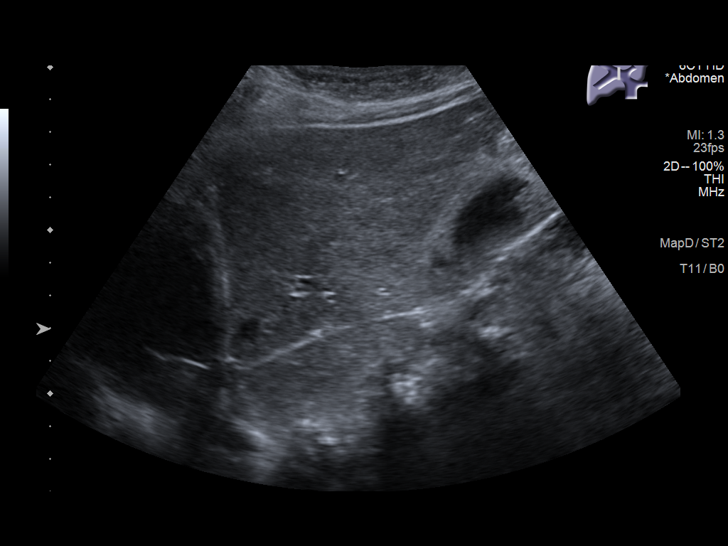
[im 39/117]
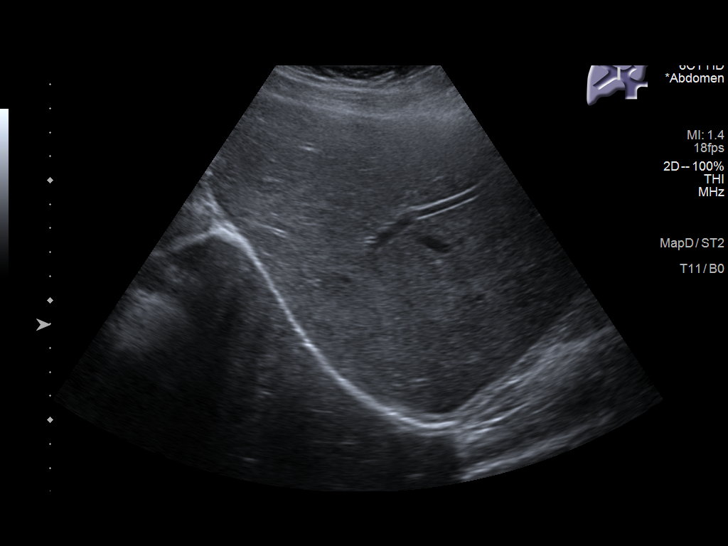
[im 44/117]
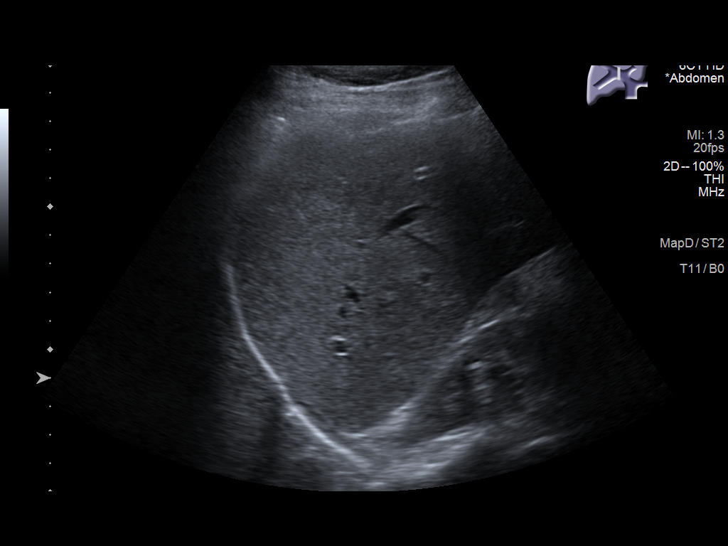
[im 54/117]
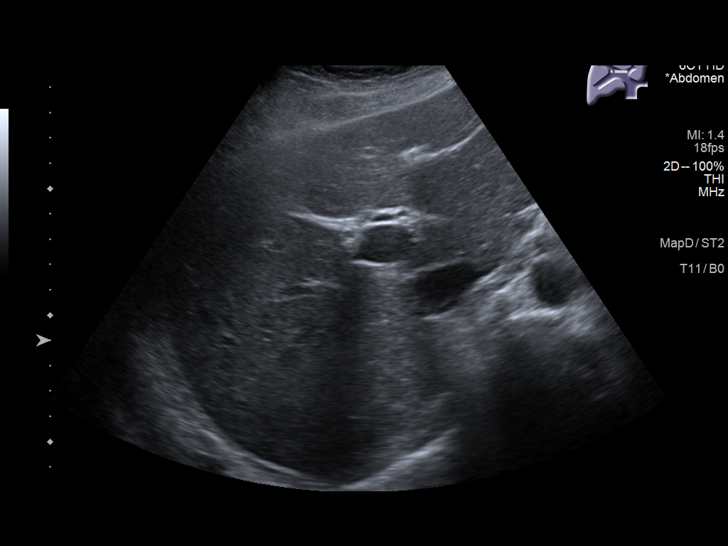
[im 63/117]
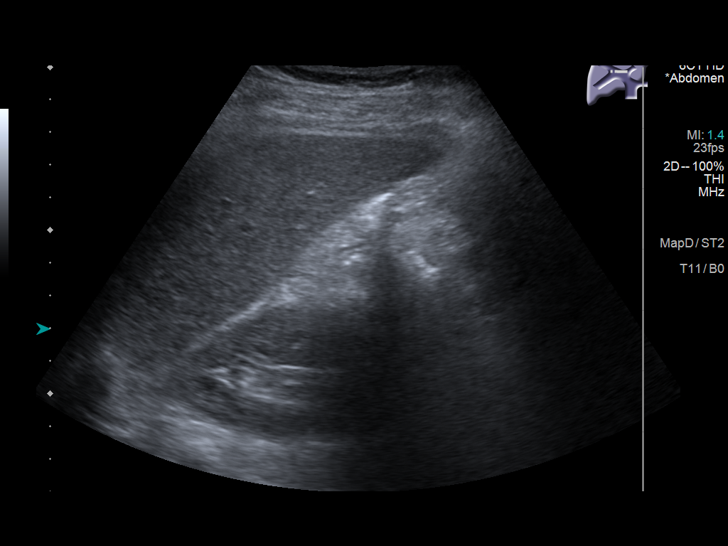
[im 73/117]
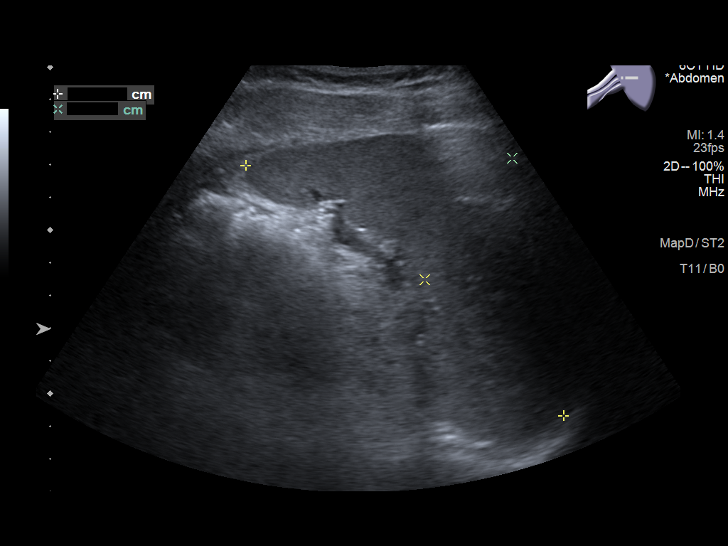
[im 78/117]
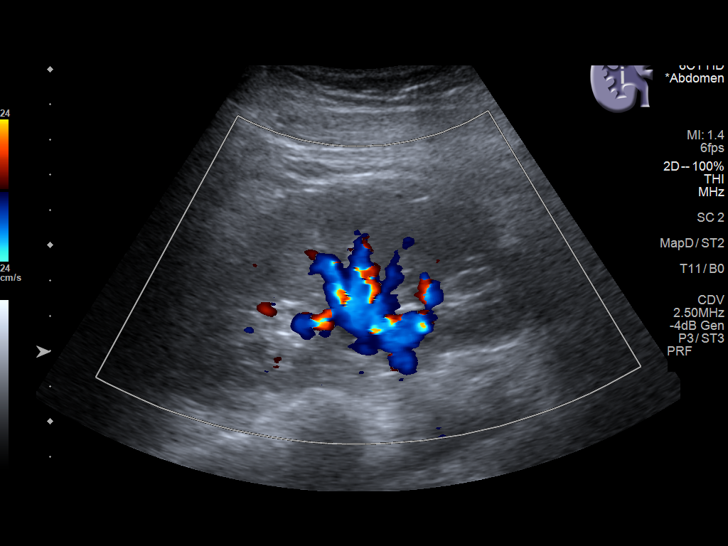
[im 88/117]
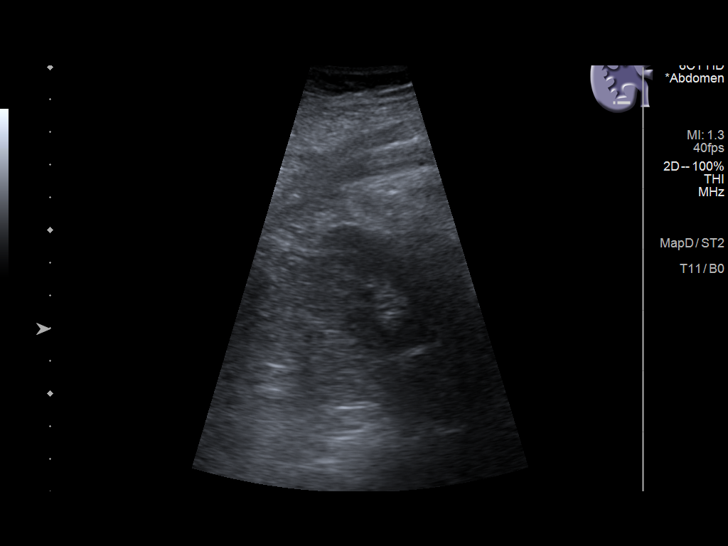
[im 97/117]
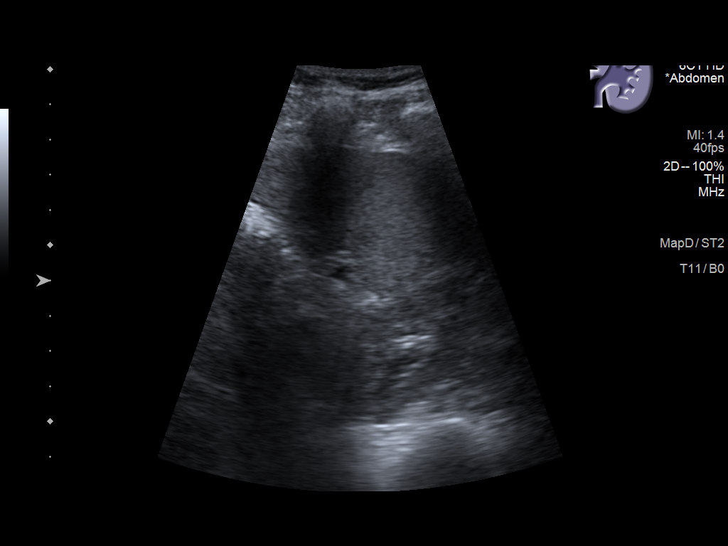
[im 107/117]
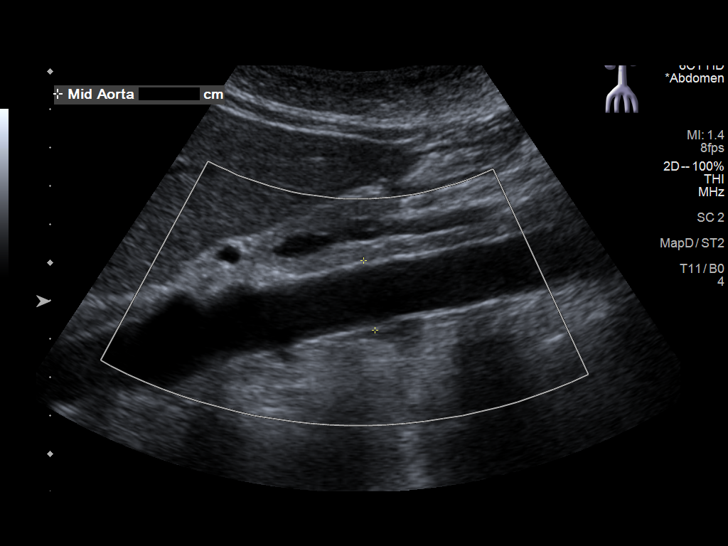
[im 117/117]
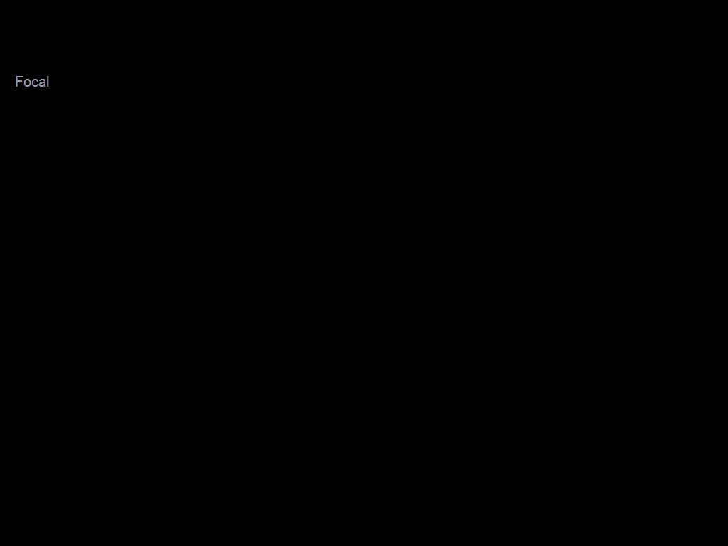

[14 of 25 positions shown; findings below may reference images not displayed]

FINDINGS: Gallbladder: No gallstones or wall thickening visualized. No
sonographic Murphy sign noted by sonographer.

Common bile duct: Diameter: 3 mm, within normal limits.

Liver: No focal lesion identified. Within normal limits in
parenchymal echogenicity. Portal vein is patent on color Doppler
imaging with normal direction of blood flow towards the liver.

IVC: No abnormality visualized.

Pancreas: Visualized portion unremarkable.

Spleen: Size and appearance within normal limits.

Right Kidney: Length: 12.0 cm. Echogenicity within normal limits. No
mass or hydronephrosis visualized.

Left Kidney: Length: 13.2 cm. Echogenicity within normal limits. No
mass or hydronephrosis visualized.

Abdominal aorta: No aneurysm visualized.

Other findings: None.
IMPRESSION: Negative abdomen ultrasound.

## 2020-01-12 ENCOUNTER — Ambulatory Visit: Payer: Managed Care, Other (non HMO) | Attending: Internal Medicine

## 2020-01-12 DIAGNOSIS — Z23 Encounter for immunization: Secondary | ICD-10-CM

## 2020-01-12 NOTE — Progress Notes (Signed)
   Covid-19 Vaccination Clinic  Name:  Madgeline Rayo    MRN: [id number] DOB: [birth year]  01/12/2020  Ms. Floor was observed post Covid-19 immunization for 15 minutes without incident. She was provided with Vaccine Information Sheet and instruction to access the V-Safe system.   Ms. Short was instructed to call 911 with any severe reactions post vaccine: Marland Kitchen Difficulty breathing  . Swelling of face and throat  . A fast heartbeat  . A bad rash all over body  . Dizziness and weakness   Immunizations Administered    Name Date Dose VIS Date Route   Pfizer COVID-19 Vaccine 01/12/2020  8:55 AM 0.3 mL 10/06/2019 Intramuscular   Manufacturer: [employer], [employer]   Lot: [id number]   NDC: [address]-3

## 2020-01-23 ENCOUNTER — Ambulatory Visit (INDEPENDENT_AMBULATORY_CARE_PROVIDER_SITE_OTHER): Payer: 59 | Admitting: Psychiatry

## 2020-01-23 ENCOUNTER — Encounter: Payer: Self-pay | Admitting: Psychiatry

## 2020-01-23 ENCOUNTER — Other Ambulatory Visit: Payer: Self-pay

## 2020-01-23 DIAGNOSIS — F411 Generalized anxiety disorder: Secondary | ICD-10-CM | POA: Diagnosis not present

## 2020-01-23 DIAGNOSIS — F5105 Insomnia due to other mental disorder: Secondary | ICD-10-CM

## 2020-01-23 DIAGNOSIS — F900 Attention-deficit hyperactivity disorder, predominantly inattentive type: Secondary | ICD-10-CM | POA: Diagnosis not present

## 2020-01-23 DIAGNOSIS — F3342 Major depressive disorder, recurrent, in full remission: Secondary | ICD-10-CM

## 2020-01-23 MED ORDER — AMPHETAMINE-DEXTROAMPHETAMINE 15 MG PO TABS: 15.0000 mg | ORAL_TABLET | Freq: Every day | ORAL | 0 refills | Status: DC

## 2020-01-23 MED ORDER — CLONAZEPAM 0.5 MG PO TABS: 0.5000 mg | ORAL_TABLET | ORAL | 1 refills | Status: DC

## 2020-01-23 MED ORDER — AMPHETAMINE-DEXTROAMPHET ER 20 MG PO CP24: 20.0000 mg | ORAL_CAPSULE | Freq: Every day | ORAL | 0 refills | Status: DC

## 2020-01-23 MED ORDER — SERTRALINE HCL 100 MG PO TABS: 200.0000 mg | ORAL_TABLET | Freq: Every day | ORAL | 1 refills | Status: DC

## 2020-01-23 NOTE — Progress Notes (Signed)
Provider Location : [facility] Patient Location : Home  Virtual Visit via Video Note  I connected with Tracey Lester on 01/23/20 at  1:00 PM EDT by a video enabled telemedicine application and verified that I am speaking with the correct person using two identifiers.   I discussed the limitations of evaluation and management by telemedicine and the availability of in person appointments. The patient expressed understanding and agreed to proceed.    I discussed the assessment and treatment plan with the patient. The patient was provided an opportunity to ask questions and all were answered. The patient agreed with the plan and demonstrated an understanding of the instructions.   The patient was advised to call back or seek an in-person evaluation if the symptoms worsen or if the condition fails to improve as anticipated.  BH MD OP Progress Note  01/23/2020 6:25 PM Tracey Lester  MRN:  [id number]  Chief Complaint:  Chief Complaint    Follow-up     HPI: Tracey Lester is a 49 year old Caucasian female, married, lives in [place], has a history of GAD, MDD, ADHD was evaluated by telemedicine today.  Patient today reports she is currently doing well with regards to her mood symptoms.  She denies any sadness, lack of motivation or anxiety.  She is compliant on Zoloft however reports she stopped taking the BuSpar.  She is doing well just on the Zoloft and wants to stay on it.  She denies side effects.  Patient reports sleep as good.  She does not take the Belsomra every night.  She is able to sleep without it most of the time.  She is compliant on her Adderall.  She denies side effects.  She reports her focus and concentration is good.  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  She enjoys meditation as well as journaling.  She continues to follow-up with her therapist and reports therapy sessions as beneficial.  Patient denies any other concerns today. Visit  Diagnosis:    ICD-10-CM   1. GAD (generalized anxiety disorder)  F41.1 clonazePAM (KLONOPIN) 0.5 MG tablet  2. MDD (major depressive disorder), recurrent, in full remission (HCC)  F33.42 sertraline (ZOLOFT) 100 MG tablet  3. Insomnia due to mental disorder  F51.05 amphetamine-dextroamphetamine (ADDERALL) 15 MG tablet    amphetamine-dextroamphetamine (ADDERALL XR) 20 MG 24 hr capsule    amphetamine-dextroamphetamine (ADDERALL XR) 20 MG 24 hr capsule  4. Attention deficit hyperactivity disorder (ADHD), predominantly inattentive type  F90.0 amphetamine-dextroamphetamine (ADDERALL) 15 MG tablet    Past Psychiatric History: I have reviewed past psychiatric history from my progress note on 01/06/2018.  Past trials of Prozac-made her happy, Wellbutrin, Cymbalta  Past Medical History:  Past Medical History:  Diagnosis Date  . ADHD (attention deficit hyperactivity disorder)   . Anxiety   . Depression     Past Surgical History:  Procedure Laterality Date  . TONSILLECTOMY      Family Psychiatric History: I have reviewed family psychiatric history from my progress note on 01/06/2018 Family History:  Family History  Problem Relation Age of Onset  . Anxiety disorder Mother   . Depression Mother   . Hypertension Mother   . Anxiety disorder Father   . Depression Father   . Hypertension Father   . Schizophrenia Paternal Grandmother   . Depression Paternal Grandmother   . Hearing loss Paternal Grandmother   . Hypertension Paternal Grandmother   . Depression Maternal Grandmother     Social History:  I have reviewed social history from my progress note on 01/06/2018 Social History   Socioeconomic History  . Marital status: Married    Spouse name: Tracey Lester  . Number of children: 3  . Years of education: Not on file  . Highest education level: Associate degree: occupational, Hotel manager, or vocational program  Occupational History  . Not on file  Tobacco Use  . Smoking status: Former Smoker     Types: Cigarettes    Quit date: 10/08/2006    Years since quitting: 13.3  . Smokeless tobacco: Never Used  Substance and Sexual Activity  . Alcohol use: Yes    Alcohol/week: 5.0 standard drinks    Types: 4 Glasses of wine, 1 Cans of beer per week  . Drug use: No  . Sexual activity: Yes    Birth control/protection: None  Other Topics Concern  . Not on file  Social History Narrative  . Not on file   Social Determinants of Health   Financial Resource Strain:   . Difficulty of Paying Living Expenses:   Food Insecurity:   . Worried About Charity fundraiser in the Last Year:   . Arboriculturist in the Last Year:   Transportation Needs:   . Film/video editor (Medical):   Marland Kitchen Lack of Transportation (Non-Medical):   Physical Activity:   . Days of Exercise per Week:   . Minutes of Exercise per Session:   Stress:   . Feeling of Stress :   Social Connections:   . Frequency of Communication with Friends and Family:   . Frequency of Social Gatherings with Friends and Family:   . Attends Religious Services:   . Active Member of Clubs or Organizations:   . Attends Archivist Meetings:   Marland Kitchen Marital Status:     Allergies:  Allergies  Allergen Reactions  . Codeine Itching    Metabolic Disorder Labs: No results found for: HGBA1C, MPG No results found for: PROLACTIN No results found for: CHOL, TRIG, HDL, CHOLHDL, VLDL, LDLCALC Lab Results  Component Value Date   TSH 2.59 08/09/2018    Therapeutic Level Labs: No results found for: LITHIUM No results found for: VALPROATE No components found for:  CBMZ  Current Medications: Current Outpatient Medications  Medication Sig Dispense Refill  . [START ON 01/29/2020] amphetamine-dextroamphetamine (ADDERALL XR) 20 MG 24 hr capsule Take 1 capsule (20 mg total) by mouth daily. 30 capsule 0  . [START ON 02/26/2020] amphetamine-dextroamphetamine (ADDERALL XR) 20 MG 24 hr capsule Take 1 capsule (20 mg total) by mouth daily. 30  capsule 0  . [START ON 03/26/2020] amphetamine-dextroamphetamine (ADDERALL XR) 20 MG 24 hr capsule Take 1 capsule (20 mg total) by mouth daily. 30 capsule 0  . [START ON 01/29/2020] amphetamine-dextroamphetamine (ADDERALL) 15 MG tablet Take 1 tablet by mouth daily. Daily at noon 30 tablet 0  . [START ON 02/26/2020] amphetamine-dextroamphetamine (ADDERALL) 15 MG tablet Take 1 tablet by mouth daily. Daily at noon 30 tablet 0  . [START ON 03/26/2020] amphetamine-dextroamphetamine (ADDERALL) 15 MG tablet Take 1 tablet by mouth daily. At noon 30 tablet 0  . clonazePAM (KLONOPIN) 0.5 MG tablet Take 1 tablet (0.5 mg total) by mouth as directed. Take it once a day as needed only for severe panic attacks 10 tablet 1  . dicyclomine (BENTYL) 10 MG capsule TAKE ONE CAPSULE BY MOUTH FOUR TIMES A DAY (BEFORE MEALS AND BEDTIME) 30 capsule 0  . hydrOXYzine (VISTARIL) 50 MG capsule Take 1 capsule (  50 mg total) by mouth at bedtime as needed. For sleep (Patient not taking: Reported on 01/06/2019) 30 capsule 1  . loratadine (CLARITIN) 10 MG tablet Take 1 tablet (10 mg total) by mouth daily. 30 tablet 5  . meclizine (ANTIVERT) 25 MG tablet Take 1 tablet (25 mg total) by mouth 3 (three) times daily as needed for dizziness. 30 tablet 1  . sertraline (ZOLOFT) 100 MG tablet Take 2 tablets (200 mg total) by mouth daily. 180 tablet 1  . Suvorexant (BELSOMRA) 15 MG TABS Take 15 mg by mouth at bedtime. 30 tablet 0   No current facility-administered medications for this visit.     Musculoskeletal: Strength & Muscle Tone: UTA Gait & Station: normal Patient leans: N/A  Psychiatric Specialty Exam: Review of Systems  Psychiatric/Behavioral: Negative for agitation, behavioral problems, confusion, decreased concentration, dysphoric mood, hallucinations, self-injury, sleep disturbance and suicidal ideas. The patient is not nervous/anxious and is not hyperactive.   All other systems reviewed and are negative.   There were no vitals  taken for this visit.There is no height or weight on file to calculate BMI.  General Appearance: Casual  Eye Contact:  Fair  Speech:  Normal Rate  Volume:  Normal  Mood:  Euthymic  Affect:  Congruent  Thought Process:  Goal Directed and Descriptions of Associations: Intact  Orientation:  Full (Time, Place, and Person)  Thought Content: Logical   Suicidal Thoughts:  No  Homicidal Thoughts:  No  Memory:  Immediate;   Fair Recent;   Fair Remote;   Fair  Judgement:  Fair  Insight:  Fair  Psychomotor Activity:  Normal  Concentration:  Concentration: Fair and Attention Span: Fair  Recall:  [employer] of Knowledge: Fair  Language: Fair  Akathisia:  No  Handed:  Right  AIMS (if indicated):UTA  Assets:  Communication Skills Desire for Improvement Housing Resilience Social Support Talents/Skills  ADL's:  Intact  Cognition: WNL  Sleep:  Fair   Screenings:   Assessment and Plan: Shauna is a 49 year old Caucasian female who has a history of depression, anxiety, ADD was evaluated by telemedicine today.  She is currently doing well on the current medication regimen.  Plan as noted below.  Plan Depression in remission Zoloft 200 mg p.o. daily. She is no longer on BuSpar.  We will discontinue the same due to noncompliance.  For anxiety-stable Zoloft as prescribed Klonopin 0.5 mg as needed for severe panic symptoms only.  We will provide her 10 pills.  She will limit use. She will continue to follow-up with her therapist as needed  Insomnia-stable Belsomra 15 mg p.o. nightly as needed. Hydroxyzine 50 mg as needed  ADD-stable Adderall extended release 20 mg p.o. daily every morning Adderall 15 mg p.o. daily after lunch I have provided 3 prescriptions with date specified last 1 to be filled on or after 03/26/2020. I have reviewed [place] controlled substance database.  Follow-up in clinic in 3 months or sooner if needed.  I have spent atleast 20 minutes non face to face with  patient today. More than 50 % of the time was spent for preparing to see the patient ( e.g., review of test, records ), ordering medications and test ,psychoeducation and supportive psychotherapy and care coordination,as well as documenting clinical information in electronic health record. This note was generated in part or whole with voice recognition software. Voice recognition is usually quite accurate but there are transcription errors that can and very often do occur. I apologize for  any typographical errors that were not detected and corrected.          Jomarie Longs, MD 01/23/2020, 6:25 PM

## 2020-02-06 ENCOUNTER — Ambulatory Visit: Payer: Managed Care, Other (non HMO) | Attending: Internal Medicine

## 2020-02-06 DIAGNOSIS — Z23 Encounter for immunization: Secondary | ICD-10-CM

## 2020-02-06 NOTE — Progress Notes (Signed)
   Covid-19 Vaccination Clinic  Name:  Marirose Deveney    MRN: [id number] DOB: 1971-09-08  02/06/2020  Ms. Lindo was observed post Covid-19 immunization for 15 minutes without incident. She was provided with Vaccine Information Sheet and instruction to access the V-Safe system.   Ms. Lodato was instructed to call 911 with any severe reactions post vaccine: Marland Kitchen Difficulty breathing  . Swelling of face and throat  . A fast heartbeat  . A bad rash all over body  . Dizziness and weakness   Immunizations Administered    Name Date Dose VIS Date Route   Pfizer COVID-19 Vaccine 02/06/2020  9:16 AM 0.3 mL 10/06/2019 Intramuscular   Manufacturer: [employer], [employer]   Lot: [id number]   NDC: [address]-8

## 2020-04-12 ENCOUNTER — Ambulatory Visit
Admission: EM | Admit: 2020-04-12 | Discharge: 2020-04-12 | Disposition: A | Discharge status: Home / Self Care | Payer: Managed Care, Other (non HMO) | Attending: Emergency Medicine | Admitting: Emergency Medicine

## 2020-04-12 ENCOUNTER — Encounter: Payer: Self-pay | Admitting: Emergency Medicine

## 2020-04-12 DIAGNOSIS — B001 Herpesviral vesicular dermatitis: Secondary | ICD-10-CM

## 2020-04-12 DIAGNOSIS — R300 Dysuria: Secondary | ICD-10-CM | POA: Diagnosis present

## 2020-04-12 HISTORY — DX: Herpesviral infection, unspecified: B00.9

## 2020-04-12 LAB — POCT URINALYSIS DIP (MANUAL ENTRY)
Bilirubin, UA: NEGATIVE
Glucose, UA: NEGATIVE mg/dL
Ketones, POC UA: NEGATIVE mg/dL
Nitrite, UA: NEGATIVE
Spec Grav, UA: >=1.03 — AB (ref 1.010–1.025)
Urobilinogen, UA: 0.2 E.U./dL
pH, UA: 6 (ref 5.0–8.0)

## 2020-04-12 MED ORDER — VALACYCLOVIR HCL 500 MG PO TABS
500.0000 mg | ORAL_TABLET | Freq: Two times a day (BID) | ORAL | 0 refills | Status: DC
Start: 2020-04-12 — End: 2021-10-31

## 2020-04-12 NOTE — ED Notes (Addendum)
Pt c/o herpes flare up onset 2 days ago. Pt also c/o pressure with urinating. Pt states symptoms subsided naturally after two weeks and then came back.

## 2020-04-12 NOTE — ED Triage Notes (Signed)
Pt c/o herpes flare up onset 2 days ago. She would like a refill on Valtrex. Pt also c/o pressure with urinating. Pt states symptoms subsided naturally after two weeks and then came back.

## 2020-04-12 NOTE — ED Provider Notes (Signed)
Renaldo Fiddler    CSN: [id number] Arrival date & time: 04/12/20  0825      History   Chief Complaint Chief Complaint  Patient presents with  . Medication Refill    HPI Tracey Lester is a 49 y.o. female.   Patient presents with request for a refill on Valtrex.  She states she started with an outbreak 2 days ago.  Her last outbreak prior to this was >1 year ago.  She also reports dysuria intermittently x 2 weeks.  She denies fever, chills, abdominal pain, back pain, vaginal discharge, pelvic pain, or other symptoms.  No treatments attempted at home.    The history is provided by the patient.    Past Medical History:  Diagnosis Date  . ADHD (attention deficit hyperactivity disorder)   . Anxiety   . Depression   . Herpes     Patient Active Problem List   Diagnosis Date Noted  . 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  . GAD (generalized anxiety disorder) 05/31/2019  . MDD (major depressive disorder), recurrent episode, mild (HCC) 05/31/2019  . Insomnia due to mental condition 05/31/2019  . Family history of celiac disease 11/28/2018  . Generalized abdominal pain 11/28/2018  . Alternating constipation and diarrhea 11/28/2018    Past Surgical History:  Procedure Laterality Date  . TONSILLECTOMY      OB History   No obstetric history on file.      Home Medications    Prior to Admission medications   Medication Sig Start Date End Date Taking? Authorizing Provider  amphetamine-dextroamphetamine (ADDERALL XR) 20 MG 24 hr capsule Take 1 capsule (20 mg total) by mouth daily. 03/26/20  Yes Eappen, Levin Bacon, MD  amphetamine-dextroamphetamine (ADDERALL) 15 MG tablet Take 1 tablet by mouth daily. At noon 03/26/20  Yes Eappen, Levin Bacon, MD  loratadine (CLARITIN) 10 MG tablet Take 1 tablet (10 mg total) by mouth daily. 08/09/18  Yes Guse, Janna Arch, FNP  sertraline (ZOLOFT) 100 MG tablet Take 2 tablets (200 mg total) by mouth daily. 01/23/20  Yes Jomarie Longs, MD    amphetamine-dextroamphetamine (ADDERALL XR) 20 MG 24 hr capsule Take 1 capsule (20 mg total) by mouth daily. 01/29/20   Jomarie Longs, MD  amphetamine-dextroamphetamine (ADDERALL XR) 20 MG 24 hr capsule Take 1 capsule (20 mg total) by mouth daily. 02/26/20   Jomarie Longs, MD  amphetamine-dextroamphetamine (ADDERALL) 15 MG tablet Take 1 tablet by mouth daily. Daily at noon 01/29/20   Eappen, Levin Bacon, MD  amphetamine-dextroamphetamine (ADDERALL) 15 MG tablet Take 1 tablet by mouth daily. Daily at noon 02/26/20   Jomarie Longs, MD  clonazePAM (KLONOPIN) 0.5 MG tablet Take 1 tablet (0.5 mg total) by mouth as directed. Take it once a day as needed only for severe panic attacks 01/23/20   Jomarie Longs, MD  dicyclomine (BENTYL) 10 MG capsule TAKE ONE CAPSULE BY MOUTH FOUR TIMES A DAY (BEFORE MEALS AND BEDTIME) 01/13/19   Vanga, Loel Dubonnet, MD  hydrOXYzine (VISTARIL) 50 MG capsule Take 1 capsule (50 mg total) by mouth at bedtime as needed. For sleep Patient not taking: Reported on 01/06/2019 08/24/18   Jomarie Longs, MD  meclizine (ANTIVERT) 25 MG tablet Take 1 tablet (25 mg total) by mouth 3 (three) times daily as needed for dizziness. 08/09/18   Guse, Janna Arch, FNP  Suvorexant (BELSOMRA) 15 MG TABS Take 15 mg by mouth at bedtime. 11/03/19   Jomarie Longs, MD  valACYclovir (VALTREX) 500 MG tablet Take 1 tablet (500 mg total) by mouth 2 (  two) times daily. 04/12/20   Mickie Bail, NP    Family History Family History  Problem Relation Age of Onset  . Anxiety disorder Mother   . Depression Mother   . Hypertension Mother   . Anxiety disorder Father   . Depression Father   . Hypertension Father   . Schizophrenia Paternal Grandmother   . Depression Paternal Grandmother   . Hearing loss Paternal Grandmother   . Hypertension Paternal Grandmother   . Depression Maternal Grandmother     Social History Social History   Tobacco Use  . Smoking status: Former Smoker    Types: Cigarettes    Quit  date: 10/08/2006    Years since quitting: 13.5  . Smokeless tobacco: Never Used  Vaping Use  . Vaping Use: Never used  Substance Use Topics  . Alcohol use: Yes    Alcohol/week: 5.0 standard drinks    Types: 4 Glasses of wine, 1 Cans of beer per week  . Drug use: No     Allergies   Codeine   Review of Systems Review of Systems  Constitutional: Negative for chills and fever.  HENT: Negative for ear pain and sore throat.   Eyes: Negative for pain and visual disturbance.  Respiratory: Negative for cough and shortness of breath.   Cardiovascular: Negative for chest pain and palpitations.  Gastrointestinal: Negative for abdominal pain and vomiting.  Genitourinary: Positive for dysuria. Negative for flank pain, hematuria, pelvic pain and vaginal discharge.  Musculoskeletal: Negative for arthralgias and back pain.  Skin: Positive for rash. Negative for color change.  Neurological: Negative for seizures and syncope.  All other systems reviewed and are negative.    Physical Exam Triage Vital Signs ED Triage Vitals  Enc Vitals Group     BP      Pulse      Resp      Temp      Temp src      SpO2      Weight      Height      Head Circumference      Peak Flow      Pain Score      Pain Loc      Pain Edu?      Excl. in GC?    No data found.  Updated Vital Signs BP 95/60 (BP Location: Left Arm)   Pulse 85   Temp 99 F (37.2 C) (Oral)   Resp 14   LMP 03/27/2020   SpO2 97%   Visual Acuity Right Eye Distance:   Left Eye Distance:   Bilateral Distance:    Right Eye Near:   Left Eye Near:    Bilateral Near:     Physical Exam Vitals and nursing note reviewed.  Constitutional:      General: She is not in acute distress.    Appearance: She is well-developed.  HENT:     Head: Normocephalic and atraumatic.     Mouth/Throat:     Mouth: Mucous membranes are moist.  Eyes:     Conjunctiva/sclera: Conjunctivae normal.  Cardiovascular:     Rate and Rhythm: Normal  rate and regular rhythm.     Heart sounds: No murmur heard.   Pulmonary:     Effort: Pulmonary effort is normal. No respiratory distress.     Breath sounds: Normal breath sounds.  Abdominal:     Palpations: Abdomen is soft.     Tenderness: There is no abdominal tenderness. There is no  right CVA tenderness, left CVA tenderness, guarding or rebound.  Musculoskeletal:     Cervical back: Neck supple.  Skin:    General: Skin is warm and dry.  Neurological:     General: No focal deficit present.     Mental Status: She is alert and oriented to person, place, and time.     Gait: Gait normal.  Psychiatric:        Mood and Affect: Mood normal.        Behavior: Behavior normal.      UC Treatments / Results  Labs (all labs ordered are listed, but only abnormal results are displayed) Labs Reviewed  POCT URINALYSIS DIP (MANUAL ENTRY) - Abnormal; Notable for the following components:      Result Value   Clarity, UA cloudy (*)    Spec Grav, UA >=1.030 (*)    Blood, UA small (*)    Protein Ur, POC trace (*)    Leukocytes, UA Small (1+) (*)    All other components within normal limits  URINE CULTURE    EKG   Radiology No results found.  Procedures Procedures (including critical care time)  Medications Ordered in UC Medications - No data to display  Initial Impression / Assessment and Plan / UC Course  I have reviewed the triage vital signs and the nursing notes.  Pertinent labs & imaging results that were available during my care of the patient were reviewed by me and considered in my medical decision making (see chart for details).   Herpes labialis.  Dysuria.  Urine culture pending.  Treating herpes outbreak with Valtrex.  Education provided about genital herpes and dysuria.  Instructed patient to establish a PCP or OB/GYN to follow-up with if her symptoms are not improving.  Patient agrees to plan of care.     Final Clinical Impressions(s) / UC Diagnoses   Final  diagnoses:  Herpes labialis  Dysuria     Discharge Instructions     Take the Valtrex as directed.    A urine culture is pending.  We will call you if it is positive requiring treatment.    Follow up with your primary care provider if your symptoms are not improving.       ED Prescriptions    Medication Sig Dispense Auth. Provider   valACYclovir (VALTREX) 500 MG tablet Take 1 tablet (500 mg total) by mouth 2 (two) times daily. 6 tablet Sharion Balloon, NP     PDMP not reviewed this encounter.   Sharion Balloon, NP 04/12/20 [phone]

## 2020-04-12 NOTE — Discharge Instructions (Addendum)
Take the Valtrex as directed.    A urine culture is pending.  We will call you if it is positive requiring treatment.    Follow up with your primary care provider if your symptoms are not improving.

## 2020-04-12 NOTE — ED Triage Notes (Deleted)
Here for medication refill of valacyclovir

## 2020-04-13 LAB — URINE CULTURE: Culture: >=100000 — AB

## 2020-04-16 ENCOUNTER — Telehealth (HOSPITAL_COMMUNITY): Payer: Self-pay | Admitting: Orthopedic Surgery

## 2020-04-16 MED ORDER — CEPHALEXIN 500 MG PO CAPS
500.0000 mg | ORAL_CAPSULE | Freq: Two times a day (BID) | ORAL | 0 refills | Status: AC
Start: 2020-04-16 — End: 2020-04-21

## 2020-04-22 ENCOUNTER — Telehealth: Payer: Self-pay

## 2020-04-22 ENCOUNTER — Telehealth: Payer: Self-pay | Admitting: Psychiatry

## 2020-04-22 ENCOUNTER — Other Ambulatory Visit: Payer: Self-pay

## 2020-04-22 ENCOUNTER — Telehealth (INDEPENDENT_AMBULATORY_CARE_PROVIDER_SITE_OTHER): Payer: 59 | Admitting: Psychiatry

## 2020-04-22 DIAGNOSIS — Z79899 Other long term (current) drug therapy: Secondary | ICD-10-CM

## 2020-04-22 DIAGNOSIS — F411 Generalized anxiety disorder: Secondary | ICD-10-CM

## 2020-04-22 DIAGNOSIS — F3342 Major depressive disorder, recurrent, in full remission: Secondary | ICD-10-CM

## 2020-04-22 DIAGNOSIS — F900 Attention-deficit hyperactivity disorder, predominantly inattentive type: Secondary | ICD-10-CM

## 2020-04-22 DIAGNOSIS — F908 Attention-deficit hyperactivity disorder, other type: Secondary | ICD-10-CM

## 2020-04-22 DIAGNOSIS — F5105 Insomnia due to other mental disorder: Secondary | ICD-10-CM

## 2020-04-22 MED ORDER — AMPHETAMINE-DEXTROAMPHETAMINE 15 MG PO TABS: 15.0000 mg | ORAL_TABLET | Freq: Every day | ORAL | 0 refills | Status: DC

## 2020-04-22 MED ORDER — AMPHETAMINE-DEXTROAMPHET ER 20 MG PO CP24: 20.0000 mg | ORAL_CAPSULE | Freq: Every day | ORAL | 0 refills | Status: DC

## 2020-04-22 NOTE — Telephone Encounter (Signed)
I have sent Adderall extended release 20 mg and 15 mg tablet with date specified to be filled on or after 05/06/2020 to her pharmacy.  Also I spoke to patient and advised her to go to lab at the hospital when she comes back from [place] to get a urine drug screen analysis.  Once she completes that writer will be able to refill her prescription for the month after July.  Patient also advised to schedule an appointment with writer the end of August when she returns from [place].

## 2020-04-22 NOTE — Telephone Encounter (Signed)
lab work orders faxed and confirmed.  

## 2020-04-22 NOTE — Progress Notes (Signed)
error 

## 2020-06-14 ENCOUNTER — Telehealth: Payer: Self-pay

## 2020-06-14 DIAGNOSIS — F908 Attention-deficit hyperactivity disorder, other type: Secondary | ICD-10-CM

## 2020-06-14 DIAGNOSIS — F900 Attention-deficit hyperactivity disorder, predominantly inattentive type: Secondary | ICD-10-CM

## 2020-06-14 MED ORDER — AMPHETAMINE-DEXTROAMPHETAMINE 15 MG PO TABS: 15.0000 mg | ORAL_TABLET | Freq: Every day | ORAL | 0 refills | Status: DC

## 2020-06-14 MED ORDER — AMPHETAMINE-DEXTROAMPHET ER 20 MG PO CP24: 20.0000 mg | ORAL_CAPSULE | Freq: Every day | ORAL | 0 refills | Status: DC

## 2020-06-14 NOTE — Telephone Encounter (Signed)
pt needs refills on her medicaiton esp the adderalls she need enough to get to her next appt at least.

## 2020-06-14 NOTE — Telephone Encounter (Signed)
Spoke to patient.  Reminded her that she needs a urine drug analysis done prior to future refills.  However will give her 7-day supply for now.  She will get the labs done within the next few days.  Per review of notes- per Shanda Bumps CMA lab slip was faxed and confirmed to [facility] on 04/22/2020

## 2020-06-17 ENCOUNTER — Other Ambulatory Visit
Admission: RE | Admit: 2020-06-17 | Discharge: 2020-06-17 | Disposition: A | Discharge status: Home / Self Care | Payer: Managed Care, Other (non HMO) | Attending: Psychiatry | Admitting: Psychiatry

## 2020-06-17 ENCOUNTER — Telehealth: Payer: Self-pay

## 2020-06-17 DIAGNOSIS — F5105 Insomnia due to other mental disorder: Secondary | ICD-10-CM

## 2020-06-17 DIAGNOSIS — Z79899 Other long term (current) drug therapy: Secondary | ICD-10-CM | POA: Insufficient documentation

## 2020-06-17 NOTE — Telephone Encounter (Signed)
pt checking on status for medication refills

## 2020-06-19 NOTE — Telephone Encounter (Signed)
Contacted [facility] lab-spoke to [place] staff-discussed the pending drug analysis.  She gave me contact information for [facility] since it was already sent out.  Contacted [facility]-spoke to staff-reported that it will be resulted tomorrow.  We will await lab results prior to sending her stimulants.  She was already sent out 7 days supply recently.

## 2020-06-20 MED ORDER — AMPHETAMINE-DEXTROAMPHETAMINE 15 MG PO TABS: 15.0000 mg | ORAL_TABLET | Freq: Every day | ORAL | 0 refills | Status: DC

## 2020-06-20 MED ORDER — AMPHETAMINE-DEXTROAMPHET ER 20 MG PO CP24: 20.0000 mg | ORAL_CAPSULE | Freq: Every day | ORAL | 0 refills | Status: DC

## 2020-06-20 NOTE — Telephone Encounter (Signed)
UDS not yet resulted. Patient is due for adderall, will send supplies . Will review lab when resulted and discuss further .

## 2020-06-20 NOTE — Addendum Note (Signed)
Addended byJomarie Longs on: 06/20/2020 05:17 PM   Modules accepted: Orders

## 2020-06-22 LAB — AMPHETAMINE CONF, UR
Amphetamine GC/MS Conf: 1758 ng/mL
Amphetamine, Ur: POSITIVE — AB
Amphetamines: POSITIVE — AB
Methamphetamine, Ur: NEGATIVE

## 2020-06-22 LAB — URINE DRUGS OF ABUSE SCREEN W ALC, ROUTINE (REF LAB)
Barbiturate, Ur: NEGATIVE ng/mL
Benzodiazepine Quant, Ur: NEGATIVE ng/mL
Cocaine (Metab.): NEGATIVE ng/mL
Ethanol U, Quan: NEGATIVE %
Methadone Screen, Urine: NEGATIVE ng/mL
Opiate Quant, Ur: NEGATIVE ng/mL
Phencyclidine, Ur: NEGATIVE ng/mL
Propoxyphene, Urine: NEGATIVE ng/mL

## 2020-06-22 LAB — PANEL 799049: Cannabinoid GC/MS, Ur: NEGATIVE

## 2020-06-26 ENCOUNTER — Telehealth: Payer: Self-pay | Admitting: Psychiatry

## 2020-06-27 ENCOUNTER — Encounter: Payer: Self-pay | Admitting: Psychiatry

## 2020-06-27 ENCOUNTER — Other Ambulatory Visit: Payer: Self-pay

## 2020-06-27 ENCOUNTER — Telehealth (INDEPENDENT_AMBULATORY_CARE_PROVIDER_SITE_OTHER): Payer: 59 | Admitting: Psychiatry

## 2020-06-27 DIAGNOSIS — F908 Attention-deficit hyperactivity disorder, other type: Secondary | ICD-10-CM

## 2020-06-27 DIAGNOSIS — F330 Major depressive disorder, recurrent, mild: Secondary | ICD-10-CM

## 2020-06-27 DIAGNOSIS — F5105 Insomnia due to other mental disorder: Secondary | ICD-10-CM

## 2020-06-27 DIAGNOSIS — F411 Generalized anxiety disorder: Secondary | ICD-10-CM | POA: Diagnosis not present

## 2020-06-27 MED ORDER — AMPHETAMINE-DEXTROAMPHETAMINE 15 MG PO TABS: 15.0000 mg | ORAL_TABLET | Freq: Every day | ORAL | 0 refills | Status: DC

## 2020-06-27 MED ORDER — AMPHETAMINE-DEXTROAMPHET ER 20 MG PO CP24: 20.0000 mg | ORAL_CAPSULE | Freq: Every day | ORAL | 0 refills | Status: DC

## 2020-06-27 MED ORDER — LAMOTRIGINE 25 MG PO TABS: 25.0000 mg | ORAL_TABLET | Freq: Every day | ORAL | 1 refills | Status: DC

## 2020-06-27 MED ORDER — ZOLPIDEM TARTRATE 10 MG PO TABS: 10.0000 mg | ORAL_TABLET | Freq: Every evening | ORAL | 1 refills | Status: DC | PRN

## 2020-06-27 NOTE — Progress Notes (Signed)
Provider Location : [facility] Patient Location : Home  Participants: Patient , Provider  Virtual Visit via Video Note  I connected with Tracey Lester on 06/27/20 at  3:00 PM EDT by a video enabled telemedicine application and verified that I am speaking with the correct person using two identifiers.   I discussed the limitations of evaluation and management by telemedicine and the availability of in person appointments. The patient expressed understanding and agreed to proceed.    I discussed the assessment and treatment plan with the patient. The patient was provided an opportunity to ask questions and all were answered. The patient agreed with the plan and demonstrated an understanding of the instructions.   The patient was advised to call back or seek an in-person evaluation if the symptoms worsen or if the condition fails to improve as anticipated.   BH MD OP Progress Note  06/27/2020 6:00 PM Tracey Lester  MRN:  [id number]  Chief Complaint:  Chief Complaint    Follow-up     HPI: Tracey Lester is a 49 year old Caucasian female, married, lives in [place], has a history of GAD, MDD, ADHD was evaluated by telemedicine today.  Patient today reports she is currently struggling with mood swings.  She reports she has these mood lability, irritability which can last for a day or so.  She is happy that it does not last longer than that.  She however is worried about what could be causing it.  She does report psychosocial stressors of the pandemic, relationship struggles, her daughter who is going through some changes and so on.  She reports so far she has been coping okay however she is worried about whether symptoms will get worse.  She is interested in restarting psychotherapy sessions and agrees to get in touch with Ms. [name].  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  Patient reports sleep as restless.  She has tried medications like Ambien in the past which may have helped.  She said Belsomra does not help much with her sleep anymore.  Patient denies any suicidality, homicidality or perceptual disturbances.  She is compliant on her medications as prescribed.  Denies side effects.  Reviewed and discussed most recent urine drug screen with patient.  Patient denies any other concerns today.  Visit Diagnosis:    ICD-10-CM   1. GAD (generalized anxiety disorder)  F41.1   2. MDD (major depressive disorder), recurrent episode, mild (HCC)  F33.0 amphetamine-dextroamphetamine (ADDERALL) 15 MG tablet  3. Insomnia due to mental disorder  F51.05 amphetamine-dextroamphetamine (ADDERALL XR) 20 MG 24 hr capsule    zolpidem (AMBIEN) 10 MG tablet  4. Attention deficit hyperactivity disorder (ADHD), other type  F90.8 amphetamine-dextroamphetamine (ADDERALL XR) 20 MG 24 hr capsule    amphetamine-dextroamphetamine (ADDERALL) 15 MG tablet    lamoTRIgine (LAMICTAL) 25 MG tablet    Past Psychiatric History: I have reviewed past psychiatric history from my progress note on 01/06/2018.  Past Medical History:  Past Medical History:  Diagnosis Date  . ADHD (attention deficit hyperactivity disorder)   . Anxiety   . Depression   . Herpes     Past Surgical History:  Procedure Laterality Date  . TONSILLECTOMY      Family Psychiatric History: I have reviewed family psychiatric history from my progress note on 01/06/2018.  Family History:  Family History  Problem Relation  Age of Onset  . Anxiety disorder Mother   . Depression Mother   . Hypertension Mother   . Anxiety disorder Father   . Depression Father   . Hypertension Father   . Schizophrenia Paternal Grandmother   . Depression Paternal Grandmother   . Hearing loss Paternal Grandmother   . Hypertension Paternal Grandmother   . Depression Maternal Grandmother     Social History: I have  reviewed social history from my progress note on 01/06/2018. Social History   Socioeconomic History  . Marital status: Married    Spouse name: michael  . Number of children: 3  . Years of education: Not on file  . Highest education level: Associate degree: occupational, Scientist, product/process development, or vocational program  Occupational History  . Not on file  Tobacco Use  . Smoking status: Former Smoker    Types: Cigarettes    Quit date: 10/08/2006    Years since quitting: 13.7  . Smokeless tobacco: Never Used  Vaping Use  . Vaping Use: Never used  Substance and Sexual Activity  . Alcohol use: Yes    Alcohol/week: 5.0 standard drinks    Types: 4 Glasses of wine, 1 Cans of beer per week  . Drug use: No  . Sexual activity: Yes    Birth control/protection: None  Other Topics Concern  . Not on file  Social History Narrative  . Not on file   Social Determinants of Health   Financial Resource Strain:   . Difficulty of Paying Living Expenses: Not on file  Food Insecurity:   . Worried About Programme researcher, broadcasting/film/video in the Last Year: Not on file  . Ran Out of Food in the Last Year: Not on file  Transportation Needs:   . Lack of Transportation (Medical): Not on file  . Lack of Transportation (Non-Medical): Not on file  Physical Activity:   . Days of Exercise per Week: Not on file  . Minutes of Exercise per Session: Not on file  Stress:   . Feeling of Stress : Not on file  Social Connections:   . Frequency of Communication with Friends and Family: Not on file  . Frequency of Social Gatherings with Friends and Family: Not on file  . Attends Religious Services: Not on file  . Active Member of Clubs or Organizations: Not on file  . Attends Banker Meetings: Not on file  . Marital Status: Not on file    Allergies:  Allergies  Allergen Reactions  . Codeine Itching    Metabolic Disorder Labs: No results found for: HGBA1C, MPG No results found for: PROLACTIN No results found for:  CHOL, TRIG, HDL, CHOLHDL, VLDL, LDLCALC Lab Results  Component Value Date   TSH 2.59 08/09/2018    Therapeutic Level Labs: No results found for: LITHIUM No results found for: VALPROATE No components found for:  CBMZ  Current Medications: Current Outpatient Medications  Medication Sig Dispense Refill  . amphetamine-dextroamphetamine (ADDERALL XR) 20 MG 24 hr capsule Take 1 capsule (20 mg total) by mouth daily. 30 capsule 0  . [START ON 07/26/2020] amphetamine-dextroamphetamine (ADDERALL XR) 20 MG 24 hr capsule Take 1 capsule (20 mg total) by mouth daily. 30 capsule 0  . [START ON 08/25/2020] amphetamine-dextroamphetamine (ADDERALL XR) 20 MG 24 hr capsule Take 1 capsule (20 mg total) by mouth daily. 30 capsule 0  . amphetamine-dextroamphetamine (ADDERALL) 15 MG tablet Take 1 tablet by mouth daily. Daily at noon 30 tablet 0  . [START ON 07/26/2020] amphetamine-dextroamphetamine (  ADDERALL) 15 MG tablet Take 1 tablet by mouth daily. Daily at noon 30 tablet 0  . [START ON 08/25/2020] amphetamine-dextroamphetamine (ADDERALL) 15 MG tablet Take 1 tablet by mouth daily. At noon 30 tablet 0  . clonazePAM (KLONOPIN) 0.5 MG tablet Take 1 tablet (0.5 mg total) by mouth as directed. Take it once a day as needed only for severe panic attacks 10 tablet 1  . dicyclomine (BENTYL) 10 MG capsule TAKE ONE CAPSULE BY MOUTH FOUR TIMES A DAY (BEFORE MEALS AND BEDTIME) 30 capsule 0  . hydrOXYzine (VISTARIL) 50 MG capsule Take 1 capsule (50 mg total) by mouth at bedtime as needed. For sleep (Patient not taking: Reported on 01/06/2019) 30 capsule 1  . lamoTRIgine (LAMICTAL) 25 MG tablet Take 1 tablet (25 mg total) by mouth daily. 30 tablet 1  . loratadine (CLARITIN) 10 MG tablet Take 1 tablet (10 mg total) by mouth daily. 30 tablet 5  . meclizine (ANTIVERT) 25 MG tablet Take 1 tablet (25 mg total) by mouth 3 (three) times daily as needed for dizziness. 30 tablet 1  . sertraline (ZOLOFT) 100 MG tablet Take 2 tablets (200  mg total) by mouth daily. 180 tablet 1  . valACYclovir (VALTREX) 500 MG tablet Take 1 tablet (500 mg total) by mouth 2 (two) times daily. 6 tablet 0  . zolpidem (AMBIEN) 10 MG tablet Take 1 tablet (10 mg total) by mouth at bedtime as needed for sleep. 30 tablet 1   No current facility-administered medications for this visit.     Musculoskeletal: Strength & Muscle Tone: UTA Gait & Station: normal Patient leans: N/A  Psychiatric Specialty Exam: Review of Systems  Psychiatric/Behavioral: Positive for dysphoric mood and sleep disturbance. Negative for behavioral problems, confusion, decreased concentration, hallucinations, self-injury and suicidal ideas. The patient is not nervous/anxious and is not hyperactive.   All other systems reviewed and are negative.   There were no vitals taken for this visit.There is no height or weight on file to calculate BMI.  General Appearance: Casual  Eye Contact:  Fair  Speech:  Clear and Coherent  Volume:  Normal  Mood:  Depressed,mood swings  Affect:  Congruent  Thought Process:  Goal Directed and Descriptions of Associations: Intact  Orientation:  Full (Time, Place, and Person)  Thought Content: Logical   Suicidal Thoughts:  No  Homicidal Thoughts:  No  Memory:  Immediate;   Fair Recent;   Fair Remote;   Fair  Judgement:  Fair  Insight:  Fair  Psychomotor Activity:  Normal  Concentration:  Concentration: Fair and Attention Span: Fair  Recall:  [employer]  Fund of Knowledge: Fair  Language: Fair  Akathisia:  No  Handed:  Right  AIMS (if indicated): uta  Assets:  Communication Skills Desire for Improvement Housing Social Support  ADL's:  Intact  Cognition: WNL  Sleep:  Poor   Screenings:   Assessment and Plan: Tracey AranShirley Lester is a 49 year old Caucasian female who has a history of depression, anxiety, ADD was evaluated by telemedicine today.  Patient is currently struggling with mood swings and sleep issues and will benefit from medication  readjustment.  Plan Depression-unstable Zoloft 200 mg p.o. daily. Start Lamictal 25 mg p.o. daily.  Anxiety-stable Zoloft as prescribed. Klonopin 0.5 mg as needed for severe panic symptoms only.   Insomnia-unstable Discontinue Belsomra for lack of benefit Start Ambien 10 mg p.o. nightly. I have reviewed [place] controlled substance database. Hydroxyzine 50 mg p.o. nightly as needed  ADD-stable Adderall extended release 20  mg p.o. daily in the morning Adderall 15 mg p.o. daily after lunch. I have provided 3 prescriptions with date specified-last 1 to be filled on or after 08/25/2020.  Reviewed and discussed urine drug screen-positive for amphetamines otherwise within normal limits.  Follow-up in clinic in 4 to 6 weeks or sooner if needed.  I have spent atleast 20 minutes face to face with patient today. More than 50 % of the time was spent for preparing to see the patient ( e.g., review of test, records ), obtaining and to review and separately obtained history , ordering medications and test ,psychoeducation and supportive psychotherapy and care coordination,as well as documenting clinical information in electronic health record,interpreting results of test and communication of results. This note was generated in part or whole with voice recognition software. Voice recognition is usually quite accurate but there are transcription errors that can and very often do occur. I apologize for any typographical errors that were not detected and corrected.        Jomarie Longs, MD 06/28/2020, 7:54 AM

## 2020-07-03 NOTE — Telephone Encounter (Signed)
error 

## 2020-07-18 ENCOUNTER — Telehealth: Payer: Self-pay

## 2020-07-18 DIAGNOSIS — F330 Major depressive disorder, recurrent, mild: Secondary | ICD-10-CM

## 2020-07-18 DIAGNOSIS — F411 Generalized anxiety disorder: Secondary | ICD-10-CM

## 2020-07-18 DIAGNOSIS — F5105 Insomnia due to other mental disorder: Secondary | ICD-10-CM

## 2020-07-18 DIAGNOSIS — F908 Attention-deficit hyperactivity disorder, other type: Secondary | ICD-10-CM

## 2020-07-18 MED ORDER — ARIPIPRAZOLE 2 MG PO TABS: 2.0000 mg | ORAL_TABLET | Freq: Every day | ORAL | 0 refills | Status: DC

## 2020-07-18 MED ORDER — LAMOTRIGINE 25 MG PO TABS: 50.0000 mg | ORAL_TABLET | ORAL | 1 refills | Status: DC

## 2020-07-18 NOTE — Telephone Encounter (Signed)
Left message for Tina.

## 2020-07-18 NOTE — Telephone Encounter (Signed)
pt called wanted to see if you would increase the lamitcal or if you can add something else.

## 2020-07-18 NOTE — Telephone Encounter (Signed)
tina thompson called she states she had concerns about patient and she wanted to speak with you

## 2020-07-18 NOTE — Telephone Encounter (Signed)
Returned call to patient.  She reports she is currently struggling with mood swings, appeared to be tearful . Discussed increasing Lamictal to 50 mg for 15 days and then to 75 mg p.o. daily after that.  Add Abilify 2 mg p.o. daily  Discussed medication adverse side effects, drug to drug interaction including serotonin syndrome with her other medications.  She has upcoming appointment with her therapist Ms. [name].  Discussed with patient that she can be put on a waiting list and if there are any no-shows or cancellations she can be seen sooner for another evaluation.  She otherwise has upcoming appointment with writer on 08/14/2020

## 2020-07-29 ENCOUNTER — Telehealth: Payer: Self-pay

## 2020-07-29 DIAGNOSIS — F3342 Major depressive disorder, recurrent, in full remission: Secondary | ICD-10-CM

## 2020-07-29 MED ORDER — SERTRALINE HCL 100 MG PO TABS: 200.0000 mg | ORAL_TABLET | Freq: Every day | ORAL | 1 refills | Status: DC

## 2020-07-29 NOTE — Telephone Encounter (Signed)
I have sent sertraline to pharmacy. 

## 2020-07-29 NOTE — Telephone Encounter (Signed)
pt request refill on sertraline.

## 2020-08-14 ENCOUNTER — Telehealth (INDEPENDENT_AMBULATORY_CARE_PROVIDER_SITE_OTHER): Payer: 59 | Admitting: Psychiatry

## 2020-08-14 ENCOUNTER — Other Ambulatory Visit: Payer: Self-pay | Admitting: Psychiatry

## 2020-08-14 ENCOUNTER — Encounter: Payer: Self-pay | Admitting: Psychiatry

## 2020-08-14 ENCOUNTER — Other Ambulatory Visit: Payer: Self-pay

## 2020-08-14 DIAGNOSIS — F411 Generalized anxiety disorder: Secondary | ICD-10-CM

## 2020-08-14 DIAGNOSIS — F5105 Insomnia due to other mental disorder: Secondary | ICD-10-CM

## 2020-08-14 DIAGNOSIS — F900 Attention-deficit hyperactivity disorder, predominantly inattentive type: Secondary | ICD-10-CM

## 2020-08-14 DIAGNOSIS — F330 Major depressive disorder, recurrent, mild: Secondary | ICD-10-CM

## 2020-08-14 DIAGNOSIS — F333 Major depressive disorder, recurrent, severe with psychotic symptoms: Secondary | ICD-10-CM

## 2020-08-14 MED ORDER — ZOLPIDEM TARTRATE 10 MG PO TABS: 10.0000 mg | ORAL_TABLET | Freq: Every evening | ORAL | 1 refills | Status: DC | PRN

## 2020-08-14 MED ORDER — ARIPIPRAZOLE 2 MG PO TABS: 2.0000 mg | ORAL_TABLET | Freq: Every day | ORAL | 0 refills | Status: DC

## 2020-08-14 MED ORDER — LAMOTRIGINE 100 MG PO TABS: 100.0000 mg | ORAL_TABLET | Freq: Every day | ORAL | 0 refills | Status: DC

## 2020-08-14 NOTE — Progress Notes (Signed)
Virtual Visit via Video Note  I connected with Tracey Lester on 08/14/20 at 11:00 AM EDT by a video enabled telemedicine application and verified that I am speaking with the correct person using two identifiers.  Location Provider Location : [facility] Patient Location : Home  Participants: Patient , Provider    I discussed the limitations of evaluation and management by telemedicine and the availability of in person appointments. The patient expressed understanding and agreed to proceed.    I discussed the assessment and treatment plan with the patient. The patient was provided an opportunity to ask questions and all were answered. The patient agreed with the plan and demonstrated an understanding of the instructions.   The patient was advised to call back or seek an in-person evaluation if the symptoms worsen or if the condition fails to improve as anticipated.   BH MD OP Progress Note  08/14/2020 5:36 PM [name]  MRN:  [id number]  Chief Complaint:  Chief Complaint    Follow-up     HPI: Tracey Lester is a 49 year old Caucasian female, married, lives in [place], has a history of GAD, MDD, ADHD was evaluated by telemedicine today.  Patient today reports since starting the Abilify she has felt much better.  She has noticed improvement in her mood swings, irritability.  Patient also reports recent psychosis.  She has not discussed this with Clinical research associate until today.  She reports that she has episodes when she has this extreme fear of something bad happening to her children and she keeps repeating these thoughts in her mind until these thoughts or feelings become real to her.  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  She however reports she currently does not feel that way and the Abilify has made a big difference.  She may have tried another antipsychotic medication in the past however does not remember the name.  She reports sleep is good on the Ambien.  She  continues to take Adderall and it helps with her concentration.  She denies any suicidality or homicidality.  She continues to be in psychotherapy sessions with Ms. [name] and reports therapy sessions is beneficial.  Visit Diagnosis:    ICD-10-CM   1. GAD (generalized anxiety disorder)  F41.1   2. MDD (major depressive disorder), recurrent, severe, with psychosis (HCC)  F33.3 ARIPiprazole (ABILIFY) 2 MG tablet    lamoTRIgine (LAMICTAL) 100 MG tablet  3. Insomnia due to mental disorder  F51.05 zolpidem (AMBIEN) 10 MG tablet  4. Attention deficit hyperactivity disorder (ADHD), predominantly inattentive type  F90.0     Past Psychiatric History: I have reviewed past psychiatric history from my progress note on 01/06/2018.  Patient does report a history of postpartum psychosis.  Reports she was tried on antipsychotic medications at that time however does not remember the name.  Past Medical History:  Past Medical History:  Diagnosis Date  . ADHD (attention deficit hyperactivity disorder)   . Anxiety   . Depression   . Herpes     Past Surgical History:  Procedure Laterality Date  . TONSILLECTOMY      Family Psychiatric History: I have reviewed family psychiatric history from my progress note on 01/06/2018.  Family History:  Family History  Problem Relation Age of Onset  . Anxiety disorder Mother   . Depression Mother   . Hypertension Mother   . Anxiety disorder Father   . Depression Father   . Hypertension Father   . Schizophrenia Paternal Grandmother   .  Depression Paternal Grandmother   . Hearing loss Paternal Grandmother   . Hypertension Paternal Grandmother   . Depression Maternal Grandmother     Social History: I have reviewed social history from my progress note on 01/06/2018. Social History   Socioeconomic History  . Marital status: Married    Spouse name: michael  . Number of children: 3  . Years of education: Not on file  . Highest education level:  Associate degree: occupational, Scientist, product/process development, or vocational program  Occupational History  . Not on file  Tobacco Use  . Smoking status: Former Smoker    Types: Cigarettes    Quit date: 10/08/2006    Years since quitting: 13.8  . Smokeless tobacco: Never Used  Vaping Use  . Vaping Use: Never used  Substance and Sexual Activity  . Alcohol use: Yes    Alcohol/week: 5.0 standard drinks    Types: 4 Glasses of wine, 1 Cans of beer per week  . Drug use: No  . Sexual activity: Yes    Birth control/protection: None  Other Topics Concern  . Not on file  Social History Narrative  . Not on file   Social Determinants of Health   Financial Resource Strain:   . Difficulty of Paying Living Expenses: Not on file  Food Insecurity:   . Worried About Programme researcher, broadcasting/film/video in the Last Year: Not on file  . Ran Out of Food in the Last Year: Not on file  Transportation Needs:   . Lack of Transportation (Medical): Not on file  . Lack of Transportation (Non-Medical): Not on file  Physical Activity:   . Days of Exercise per Week: Not on file  . Minutes of Exercise per Session: Not on file  Stress:   . Feeling of Stress : Not on file  Social Connections:   . Frequency of Communication with Friends and Family: Not on file  . Frequency of Social Gatherings with Friends and Family: Not on file  . Attends Religious Services: Not on file  . Active Member of Clubs or Organizations: Not on file  . Attends Banker Meetings: Not on file  . Marital Status: Not on file    Allergies:  Allergies  Allergen Reactions  . Codeine Itching    Metabolic Disorder Labs: No results found for: HGBA1C, MPG No results found for: PROLACTIN No results found for: CHOL, TRIG, HDL, CHOLHDL, VLDL, LDLCALC Lab Results  Component Value Date   TSH 2.59 08/09/2018    Therapeutic Level Labs: No results found for: LITHIUM No results found for: VALPROATE No components found for:  CBMZ  Current  Medications: Current Outpatient Medications  Medication Sig Dispense Refill  . amphetamine-dextroamphetamine (ADDERALL XR) 20 MG 24 hr capsule Take 1 capsule (20 mg total) by mouth daily. 30 capsule 0  . amphetamine-dextroamphetamine (ADDERALL XR) 20 MG 24 hr capsule Take 1 capsule (20 mg total) by mouth daily. 30 capsule 0  . [START ON 08/25/2020] amphetamine-dextroamphetamine (ADDERALL XR) 20 MG 24 hr capsule Take 1 capsule (20 mg total) by mouth daily. 30 capsule 0  . amphetamine-dextroamphetamine (ADDERALL) 15 MG tablet Take 1 tablet by mouth daily. Daily at noon 30 tablet 0  . amphetamine-dextroamphetamine (ADDERALL) 15 MG tablet Take 1 tablet by mouth daily. Daily at noon 30 tablet 0  . [START ON 08/25/2020] amphetamine-dextroamphetamine (ADDERALL) 15 MG tablet Take 1 tablet by mouth daily. At noon 30 tablet 0  . ARIPiprazole (ABILIFY) 2 MG tablet Take 1 tablet (2 mg  total) by mouth daily. 30 tablet 0  . clonazePAM (KLONOPIN) 0.5 MG tablet Take 1 tablet (0.5 mg total) by mouth as directed. Take it once a day as needed only for severe panic attacks 10 tablet 1  . dicyclomine (BENTYL) 10 MG capsule TAKE ONE CAPSULE BY MOUTH FOUR TIMES A DAY (BEFORE MEALS AND BEDTIME) 30 capsule 0  . hydrOXYzine (VISTARIL) 50 MG capsule Take 1 capsule (50 mg total) by mouth at bedtime as needed. For sleep (Patient not taking: Reported on 01/06/2019) 30 capsule 1  . lamoTRIgine (LAMICTAL) 100 MG tablet Take 1 tablet (100 mg total) by mouth daily. 90 tablet 0  . loratadine (CLARITIN) 10 MG tablet Take 1 tablet (10 mg total) by mouth daily. 30 tablet 5  . meclizine (ANTIVERT) 25 MG tablet Take 1 tablet (25 mg total) by mouth 3 (three) times daily as needed for dizziness. 30 tablet 1  . sertraline (ZOLOFT) 100 MG tablet Take 2 tablets (200 mg total) by mouth daily. 180 tablet 1  . valACYclovir (VALTREX) 500 MG tablet Take 1 tablet (500 mg total) by mouth 2 (two) times daily. 6 tablet 0  . zolpidem (AMBIEN) 10 MG  tablet Take 1 tablet (10 mg total) by mouth at bedtime as needed for sleep. 30 tablet 1   No current facility-administered medications for this visit.     Musculoskeletal: Strength & Muscle Tone: UTA Gait & Station: normal Patient leans: N/A  Psychiatric Specialty Exam: Review of Systems  Psychiatric/Behavioral: Positive for dysphoric mood.  All other systems reviewed and are negative.   There were no vitals taken for this visit.There is no height or weight on file to calculate BMI.  General Appearance: Casual  Eye Contact:  Fair  Speech:  Clear and Coherent  Volume:  Normal  Mood:  Depressed improving  Affect:  Congruent  Thought Process:  Goal Directed and Descriptions of Associations: Intact  Orientation:  Full (Time, Place, and Person)  Thought Content: Logical   Suicidal Thoughts:  No  Homicidal Thoughts:  No  Memory:  Immediate;   Fair Recent;   Fair Remote;   Fair  Judgement:  Fair  Insight:  Fair  Psychomotor Activity:  Normal  Concentration:  Concentration: Fair and Attention Span: Fair  Recall:  [employer]  Fund of Knowledge: Fair  Language: Fair  Akathisia:  No  Handed:  Right  AIMS (if indicated): UTA  Assets:  Communication Skills Desire for Improvement Housing Resilience  ADL's:  Intact  Cognition: WNL  Sleep:  Fair   Screenings:   Assessment and Plan: Tracey AranShirley Brew is a 49 year old Caucasian female who has a history of depression, anxiety, ADD was evaluated by telemedicine today.  Patient currently is struggling with mood swings as well as recent episode of psychosis which has since resolved.  Patient will continue to benefit from medication readjustment and psychotherapy sessions.  Plan MDD with psychosis-improving Increase Lamictal to 100 mg p.o. daily Continue Abilify 2 mg p.o. daily Zoloft 200 mg p.o. daily  Anxiety-stable Zoloft as prescribed Klonopin 0.5 mg as needed for severe panic symptoms only  Insomnia-stable Ambien 10 mg p.o.  nightly I have reviewed [place] controlled substance database. Hydroxyzine 50 mg p.o. nightly as needed  ADD-stable Adderall XR 20 mg p.o. daily in the morning. Adderall 15 mg p.o. daily after lunch. Discussed with patient that if she continues to have psychosis then we will have to discontinue the stimulant since it can make her psychosis worse.  We will consider  a nonstimulant medication for ADHD.  Patient to continue CBT with Ms. [name].  Follow-up in clinic in 2 to 3 weeks or sooner if needed.  I have spent atleast 20 minutes face to face by video with patient today. More than 50 % of the time was spent for preparing to see the patient ( e.g., review of test, records ), ordering medications and test ,psychoeducation and supportive psychotherapy and care coordination,as well as documenting clinical information in electronic health record. This note was generated in part or whole with voice recognition software. Voice recognition is usually quite accurate but there are transcription errors that can and very often do occur. I apologize for any typographical errors that were not detected and corrected.       Jomarie Longs, MD 08/14/2020, 5:36 PM

## 2020-09-12 ENCOUNTER — Other Ambulatory Visit: Payer: Self-pay | Admitting: Psychiatry

## 2020-09-12 DIAGNOSIS — F333 Major depressive disorder, recurrent, severe with psychotic symptoms: Secondary | ICD-10-CM

## 2020-09-13 ENCOUNTER — Other Ambulatory Visit: Payer: Self-pay | Admitting: Psychiatry

## 2020-09-13 DIAGNOSIS — F908 Attention-deficit hyperactivity disorder, other type: Secondary | ICD-10-CM

## 2020-09-17 ENCOUNTER — Encounter: Payer: Self-pay | Admitting: Psychiatry

## 2020-09-17 ENCOUNTER — Other Ambulatory Visit: Payer: Self-pay

## 2020-09-17 ENCOUNTER — Telehealth (INDEPENDENT_AMBULATORY_CARE_PROVIDER_SITE_OTHER): Payer: 59 | Admitting: Psychiatry

## 2020-09-17 DIAGNOSIS — F333 Major depressive disorder, recurrent, severe with psychotic symptoms: Secondary | ICD-10-CM | POA: Diagnosis not present

## 2020-09-17 DIAGNOSIS — F411 Generalized anxiety disorder: Secondary | ICD-10-CM | POA: Diagnosis not present

## 2020-09-17 DIAGNOSIS — F5105 Insomnia due to other mental disorder: Secondary | ICD-10-CM

## 2020-09-17 DIAGNOSIS — F900 Attention-deficit hyperactivity disorder, predominantly inattentive type: Secondary | ICD-10-CM

## 2020-09-17 MED ORDER — AMPHETAMINE-DEXTROAMPHET ER 20 MG PO CP24: 20.0000 mg | ORAL_CAPSULE | Freq: Every day | ORAL | 0 refills | Status: DC

## 2020-09-17 MED ORDER — AMPHETAMINE-DEXTROAMPHETAMINE 15 MG PO TABS: 15.0000 mg | ORAL_TABLET | Freq: Every day | ORAL | 0 refills | Status: DC

## 2020-09-17 NOTE — Progress Notes (Signed)
Virtual Visit via Telephone Note  I connected with Tracey Lester on 09/17/20 at 10:20 AM EST by telephone and verified that I am speaking with the correct person using two identifiers.  Location Provider Location : [facility] Patient Location : Home  Participants: Patient , Provider   I discussed the limitations, risks, security and privacy concerns of performing an evaluation and management service by telephone and the availability of in person appointments. I also discussed with the patient that there may be a patient responsible charge related to this service. The patient expressed understanding and agreed to proceed.   I discussed the assessment and treatment plan with the patient. The patient was provided an opportunity to ask questions and all were answered. The patient agreed with the plan and demonstrated an understanding of the instructions.   The patient was advised to call back or seek an in-person evaluation if the symptoms worsen or if the condition fails to improve as anticipated.   BH MD OP Progress Note  09/17/2020 12:04 PM Tracey Lester  MRN:  [id number]  Chief Complaint:  Chief Complaint    Follow-up     HPI: Tracey Lester is a 49 year old Caucasian female, married, lives in [place], has a history of GAD, MDD, ADHD was evaluated by phone today.  Patient today reports mood wise she is feeling better.  She reports her depressive symptoms have improved.  She however reports she is probably struggling with side effects of the Abilify.  She reports she feels sluggish and sleepy during the day.  She also reports her appetite has increased.  She continues to have crying spells even though her depressive symptoms have improved.  She has not been able to see her therapist recently however agrees to contact her to schedule an appointment.  Patient reports sleep is good.  She continues to be on Adderall which does help with her concentration.  She denies having any psychotic  episodes and reports she is able to better cope with her extreme fear and it does not consume her anymore.  Patient denies any suicidality or homicidality.  Reports she is planning to spend her Thanksgiving holiday with her family and friends.  She is also planning to cook for the homeless shelter as well as take food to the police station.  Patient denies any other concerns today.  Visit Diagnosis:    ICD-10-CM   1. GAD (generalized anxiety disorder)  F41.1   2. MDD (major depressive disorder), recurrent, severe, with psychosis (HCC)  F33.3   3. Insomnia due to mental disorder  F51.05 amphetamine-dextroamphetamine (ADDERALL XR) 20 MG 24 hr capsule    amphetamine-dextroamphetamine (ADDERALL) 15 MG tablet  4. Attention deficit hyperactivity disorder (ADHD), predominantly inattentive type  F90.0     Past Psychiatric History: I have reviewed past psychiatric history from my progress note on 01/06/2018.   Past Medical History:  Past Medical History:  Diagnosis Date  . ADHD (attention deficit hyperactivity disorder)   . Anxiety   . Depression   . Herpes     Past Surgical History:  Procedure Laterality Date  . TONSILLECTOMY      Family Psychiatric History: I have reviewed family psychiatric history from my progress note for 3/14/ 2019.  Family History:  Family History  Problem Relation Age of Onset  . Anxiety disorder Mother   . Depression Mother   . Hypertension Mother   . Anxiety disorder Father   . Depression Father   . Hypertension Father   . Schizophrenia  Paternal Grandmother   . Depression Paternal Grandmother   . Hearing loss Paternal Grandmother   . Hypertension Paternal Grandmother   . Depression Maternal Grandmother     Social History:  Social History   Socioeconomic History  . Marital status: Married    Spouse name: michael  . Number of children: 3  . Years of education: Not on file  . Highest education level: Associate degree: occupational, Scientist, product/process development, or  vocational program  Occupational History  . Not on file  Tobacco Use  . Smoking status: Former Smoker    Types: Cigarettes    Quit date: 10/08/2006    Years since quitting: 13.9  . Smokeless tobacco: Never Used  Vaping Use  . Vaping Use: Never used  Substance and Sexual Activity  . Alcohol use: Yes    Alcohol/week: 5.0 standard drinks    Types: 4 Glasses of wine, 1 Cans of beer per week  . Drug use: No  . Sexual activity: Yes    Birth control/protection: None  Other Topics Concern  . Not on file  Social History Narrative  . Not on file   Social Determinants of Health   Financial Resource Strain:   . Difficulty of Paying Living Expenses: Not on file  Food Insecurity:   . Worried About Programme researcher, broadcasting/film/video in the Last Year: Not on file  . Ran Out of Food in the Last Year: Not on file  Transportation Needs:   . Lack of Transportation (Medical): Not on file  . Lack of Transportation (Non-Medical): Not on file  Physical Activity:   . Days of Exercise per Week: Not on file  . Minutes of Exercise per Session: Not on file  Stress:   . Feeling of Stress : Not on file  Social Connections:   . Frequency of Communication with Friends and Family: Not on file  . Frequency of Social Gatherings with Friends and Family: Not on file  . Attends Religious Services: Not on file  . Active Member of Clubs or Organizations: Not on file  . Attends Banker Meetings: Not on file  . Marital Status: Not on file    Allergies:  Allergies  Allergen Reactions  . Codeine Itching    Metabolic Disorder Labs: No results found for: HGBA1C, MPG No results found for: PROLACTIN No results found for: CHOL, TRIG, HDL, CHOLHDL, VLDL, LDLCALC Lab Results  Component Value Date   TSH 2.59 08/09/2018    Therapeutic Level Labs: No results found for: LITHIUM No results found for: VALPROATE No components found for:  CBMZ  Current Medications: Current Outpatient Medications  Medication  Sig Dispense Refill  . amphetamine-dextroamphetamine (ADDERALL XR) 20 MG 24 hr capsule Take 1 capsule (20 mg total) by mouth daily. 30 capsule 0  . amphetamine-dextroamphetamine (ADDERALL XR) 20 MG 24 hr capsule Take 1 capsule (20 mg total) by mouth daily. 30 capsule 0  . [START ON 09/26/2020] amphetamine-dextroamphetamine (ADDERALL XR) 20 MG 24 hr capsule Take 1 capsule (20 mg total) by mouth daily. 30 capsule 0  . amphetamine-dextroamphetamine (ADDERALL) 15 MG tablet Take 1 tablet by mouth daily. Daily at noon 30 tablet 0  . amphetamine-dextroamphetamine (ADDERALL) 15 MG tablet Take 1 tablet by mouth daily. At noon 30 tablet 0  . [START ON 09/26/2020] amphetamine-dextroamphetamine (ADDERALL) 15 MG tablet Take 1 tablet by mouth daily. Daily at noon 30 tablet 0  . ARIPiprazole (ABILIFY) 2 MG tablet TAKE ONE TABLET BY MOUTH DAILY 30 tablet 0  .  clonazePAM (KLONOPIN) 0.5 MG tablet Take 1 tablet (0.5 mg total) by mouth as directed. Take it once a day as needed only for severe panic attacks 10 tablet 1  . dicyclomine (BENTYL) 10 MG capsule TAKE ONE CAPSULE BY MOUTH FOUR TIMES A DAY (BEFORE MEALS AND BEDTIME) 30 capsule 0  . hydrOXYzine (VISTARIL) 50 MG capsule Take 1 capsule (50 mg total) by mouth at bedtime as needed. For sleep (Patient not taking: Reported on 01/06/2019) 30 capsule 1  . lamoTRIgine (LAMICTAL) 100 MG tablet Take 1 tablet (100 mg total) by mouth daily. 90 tablet 0  . loratadine (CLARITIN) 10 MG tablet Take 1 tablet (10 mg total) by mouth daily. 30 tablet 5  . meclizine (ANTIVERT) 25 MG tablet Take 1 tablet (25 mg total) by mouth 3 (three) times daily as needed for dizziness. 30 tablet 1  . sertraline (ZOLOFT) 100 MG tablet Take 2 tablets (200 mg total) by mouth daily. 180 tablet 1  . valACYclovir (VALTREX) 500 MG tablet Take 1 tablet (500 mg total) by mouth 2 (two) times daily. 6 tablet 0  . zolpidem (AMBIEN) 10 MG tablet Take 1 tablet (10 mg total) by mouth at bedtime as needed for sleep.  30 tablet 1   No current facility-administered medications for this visit.     Musculoskeletal: Strength & Muscle Tone: UTA Gait & Station: UTA Patient leans: N/A  Psychiatric Specialty Exam: Review of Systems  Constitutional: Positive for appetite change and fatigue.  Psychiatric/Behavioral: Positive for dysphoric mood and sleep disturbance (excessive).  All other systems reviewed and are negative.   There were no vitals taken for this visit.There is no height or weight on file to calculate BMI.  General Appearance: UTA  Eye Contact:  UTA  Speech:  Clear and Coherent  Volume:  Normal  Mood:  Depressed improving , but reports crying spells  Affect:  UTA  Thought Process:  Goal Directed and Descriptions of Associations: Intact  Orientation:  Full (Time, Place, and Person)  Thought Content: Logical   Suicidal Thoughts:  No  Homicidal Thoughts:  No  Memory:  Immediate;   Fair Recent;   Fair Remote;   Fair  Judgement:  Fair  Insight:  Fair  Psychomotor Activity:  UTA  Concentration:  Concentration: Fair and Attention Span: Fair  Recall:  [employer] of Knowledge: Fair  Language: Fair  Akathisia:  No  Handed:  Right  AIMS (if indicated):UTA  Assets:  Communication Skills Desire for Improvement Housing Social Support  ADL's:  Intact  Cognition: WNL  Sleep:  Fair   Screenings:   Assessment and Plan: Sana Tessmer is a 49 year old Caucasian female who has a history of MDD, insomnia, anxiety, ADD was evaluated by phone today.  Patient is currently making progress with regards to her depressive symptoms to some extent however is currently struggling with adverse side effects to Abilify.  We will make the following medication changes.  Plan MDD with psychosis-improving Lamictal 100 mg p.o. daily Continue Abilify 2 mg p.o. daily.  However advised patient to take it at bedtime due to the side effects. Continue Zoloft 200 mg p.o. daily  Anxiety-stable Zoloft as  prescribed Klonopin 0.5 mg as needed for severe panic symptoms only.  Insomnia-stable Ambien 10 mg p.o. nightly I have reviewed [place] controlled substance database Hydroxyzine 50 mg p.o. nightly as needed  ADD-stable Adderall extended release 20 mg p.o. daily in the morning Adderall 15 mg p.o. daily after lunch   Patient advised  to reach out to her therapist to schedule an appointment for CBT  If she continues to have mood problems , advised patient to reach out to writer and her Lamictal dosage can be readjusted.  Also discussed changing her Abilify to another medication if she continues to have side effects in spite of changing the timing of the dosage.  Follow-up in clinic in 3 to 4 weeks or sooner if needed.  I have spent atleast 20 minutes non face to face with patient today. More than 50 % of the time was spent for preparing to see the patient ( e.g., review of test, records ), ordering medications and test ,psychoeducation and supportive psychotherapy and care coordination,as well as documenting clinical information in electronic health record. This note was generated in part or whole with voice recognition software. Voice recognition is usually quite accurate but there are transcription errors that can and very often do occur. I apologize for any typographical errors that were not detected and corrected.        Jomarie Longs, MD 09/17/2020, 12:04 PM

## 2020-10-16 ENCOUNTER — Telehealth (INDEPENDENT_AMBULATORY_CARE_PROVIDER_SITE_OTHER): Payer: 59 | Admitting: Psychiatry

## 2020-10-16 ENCOUNTER — Other Ambulatory Visit: Payer: Self-pay | Admitting: Psychiatry

## 2020-10-16 ENCOUNTER — Encounter: Payer: Self-pay | Admitting: Psychiatry

## 2020-10-16 ENCOUNTER — Other Ambulatory Visit: Payer: Self-pay

## 2020-10-16 DIAGNOSIS — F908 Attention-deficit hyperactivity disorder, other type: Secondary | ICD-10-CM | POA: Diagnosis not present

## 2020-10-16 DIAGNOSIS — F5105 Insomnia due to other mental disorder: Secondary | ICD-10-CM

## 2020-10-16 DIAGNOSIS — F3342 Major depressive disorder, recurrent, in full remission: Secondary | ICD-10-CM

## 2020-10-16 DIAGNOSIS — F411 Generalized anxiety disorder: Secondary | ICD-10-CM

## 2020-10-16 DIAGNOSIS — F333 Major depressive disorder, recurrent, severe with psychotic symptoms: Secondary | ICD-10-CM

## 2020-10-16 MED ORDER — AMPHETAMINE-DEXTROAMPHETAMINE 15 MG PO TABS: 15.0000 mg | ORAL_TABLET | Freq: Every day | ORAL | 0 refills | Status: DC

## 2020-10-16 MED ORDER — LAMOTRIGINE 100 MG PO TABS: 100.0000 mg | ORAL_TABLET | Freq: Every day | ORAL | 0 refills | Status: DC

## 2020-10-16 MED ORDER — AMPHETAMINE-DEXTROAMPHET ER 20 MG PO CP24: 20.0000 mg | ORAL_CAPSULE | Freq: Every day | ORAL | 0 refills | Status: DC

## 2020-10-16 MED ORDER — ARIPIPRAZOLE 2 MG PO TABS: 2.0000 mg | ORAL_TABLET | Freq: Every day | ORAL | 0 refills | Status: DC

## 2020-10-16 NOTE — Progress Notes (Signed)
Virtual Visit via Telephone Note  I connected with Tracey Lester on 10/16/20 at  2:40 PM EST by telephone and verified that I am speaking with the correct person using two identifiers.  Location Provider Location : [facility] Patient Location : Home  Participants: Patient , Provider   I discussed the limitations, risks, security and privacy concerns of performing an evaluation and management service by telephone and the availability of in person appointments. I also discussed with the patient that there may be a patient responsible charge related to this service. The patient expressed understanding and agreed to proceed   I discussed the assessment and treatment plan with the patient. The patient was provided an opportunity to ask questions and all were answered. The patient agreed with the plan and demonstrated an understanding of the instructions.   The patient was advised to call back or seek an in-person evaluation if the symptoms worsen or if the condition fails to improve as anticipated.   BH MD OP Progress Note  10/16/2020 3:08 PM [name]  MRN:  [id number]  Chief Complaint:  Chief Complaint    Follow-up     HPI: Tracey Lester is a 49 year old Caucasian female, married, lives in [place], has a history of GAD, MDD, ADHD was evaluated by phone today.  Patient today reports she is currently doing well.  Denies any depressive symptoms.  She is better able to cope with her anxiety.  She rates her depression on a scale of 0-10, 10 being the worst as 1 out of 10.  Patient reports she is compliant on all her medications.  Denies side effects.  She reports the side effects that she had for the Abilify has resolved.  She currently takes it at bedtime and that helps.  Patient reports sleep is good.  She continues to use Ambien as needed only.  Patient denies any suicidality, homicidality or perceptual disturbances.  She got her COVID-19 booster dosage today and reports she has  mild fatigue, otherwise doing well.  She denies any other concerns today.  Visit Diagnosis:    ICD-10-CM   1. GAD (generalized anxiety disorder)  F41.1 amphetamine-dextroamphetamine (ADDERALL) 15 MG tablet  2. MDD (major depressive disorder), recurrent, in full remission (HCC)  F33.42 ARIPiprazole (ABILIFY) 2 MG tablet    lamoTRIgine (LAMICTAL) 100 MG tablet  3. Insomnia due to mental disorder  F51.05 amphetamine-dextroamphetamine (ADDERALL XR) 20 MG 24 hr capsule  4. Attention deficit hyperactivity disorder (ADHD), other type  F90.8 amphetamine-dextroamphetamine (ADDERALL) 15 MG tablet    amphetamine-dextroamphetamine (ADDERALL XR) 20 MG 24 hr capsule    Past Psychiatric History: I have reviewed past psychiatric history from my progress note on 01/06/2018  Past Medical History:  Past Medical History:  Diagnosis Date  . ADHD (attention deficit hyperactivity disorder)   . Anxiety   . Depression   . Herpes     Past Surgical History:  Procedure Laterality Date  . TONSILLECTOMY      Family Psychiatric History: Reviewed family psychiatric history from my progress note on 01/06/2018  Family History:  Family History  Problem Relation Age of Onset  . Anxiety disorder Mother   . Depression Mother   . Hypertension Mother   . Anxiety disorder Father   . Depression Father   . Hypertension Father   . Schizophrenia Paternal Grandmother   . Depression Paternal Grandmother   . Hearing loss Paternal Grandmother   . Hypertension Paternal Grandmother   . Depression Maternal Grandmother  Social History: Reviewed social history from my progress note on 01/06/2018 Social History   Socioeconomic History  . Marital status: Married    Spouse name: michael  . Number of children: 3  . Years of education: Not on file  . Highest education level: Associate degree: occupational, Scientist, product/process development, or vocational program  Occupational History  . Not on file  Tobacco Use  . Smoking status: Former  Smoker    Types: Cigarettes    Quit date: 10/08/2006    Years since quitting: 14.0  . Smokeless tobacco: Never Used  Vaping Use  . Vaping Use: Never used  Substance and Sexual Activity  . Alcohol use: Yes    Alcohol/week: 5.0 standard drinks    Types: 4 Glasses of wine, 1 Cans of beer per week  . Drug use: No  . Sexual activity: Yes    Birth control/protection: None  Other Topics Concern  . Not on file  Social History Narrative  . Not on file   Social Determinants of Health   Financial Resource Strain: Not on file  Food Insecurity: Not on file  Transportation Needs: Not on file  Physical Activity: Not on file  Stress: Not on file  Social Connections: Not on file    Allergies:  Allergies  Allergen Reactions  . Codeine Itching    Metabolic Disorder Labs: No results found for: HGBA1C, MPG No results found for: PROLACTIN No results found for: CHOL, TRIG, HDL, CHOLHDL, VLDL, LDLCALC Lab Results  Component Value Date   TSH 2.59 08/09/2018    Therapeutic Level Labs: No results found for: LITHIUM No results found for: VALPROATE No components found for:  CBMZ  Current Medications: Current Outpatient Medications  Medication Sig Dispense Refill  . amphetamine-dextroamphetamine (ADDERALL XR) 20 MG 24 hr capsule Take 1 capsule (20 mg total) by mouth daily. 30 capsule 0  . [START ON 10/26/2020] amphetamine-dextroamphetamine (ADDERALL XR) 20 MG 24 hr capsule Take 1 capsule (20 mg total) by mouth daily. 30 capsule 0  . [START ON 11/25/2020] amphetamine-dextroamphetamine (ADDERALL XR) 20 MG 24 hr capsule Take 1 capsule (20 mg total) by mouth daily. 30 capsule 0  . amphetamine-dextroamphetamine (ADDERALL) 15 MG tablet Take 1 tablet by mouth daily. Daily at noon 30 tablet 0  . [START ON 10/26/2020] amphetamine-dextroamphetamine (ADDERALL) 15 MG tablet Take 1 tablet by mouth daily. Daily at noon 30 tablet 0  . [START ON 11/25/2020] amphetamine-dextroamphetamine (ADDERALL) 15 MG tablet  Take 1 tablet by mouth daily. At noon 30 tablet 0  . ARIPiprazole (ABILIFY) 2 MG tablet Take 1 tablet (2 mg total) by mouth daily. 90 tablet 0  . clonazePAM (KLONOPIN) 0.5 MG tablet Take 1 tablet (0.5 mg total) by mouth as directed. Take it once a day as needed only for severe panic attacks 10 tablet 1  . dicyclomine (BENTYL) 10 MG capsule TAKE ONE CAPSULE BY MOUTH FOUR TIMES A DAY (BEFORE MEALS AND BEDTIME) 30 capsule 0  . hydrOXYzine (VISTARIL) 50 MG capsule Take 1 capsule (50 mg total) by mouth at bedtime as needed. For sleep (Patient not taking: Reported on 01/06/2019) 30 capsule 1  . lamoTRIgine (LAMICTAL) 100 MG tablet Take 1 tablet (100 mg total) by mouth daily. 90 tablet 0  . loratadine (CLARITIN) 10 MG tablet Take 1 tablet (10 mg total) by mouth daily. 30 tablet 5  . meclizine (ANTIVERT) 25 MG tablet Take 1 tablet (25 mg total) by mouth 3 (three) times daily as needed for dizziness. 30 tablet 1  .  sertraline (ZOLOFT) 100 MG tablet Take 2 tablets (200 mg total) by mouth daily. 180 tablet 1  . valACYclovir (VALTREX) 500 MG tablet Take 1 tablet (500 mg total) by mouth 2 (two) times daily. 6 tablet 0  . zolpidem (AMBIEN) 10 MG tablet Take 1 tablet (10 mg total) by mouth at bedtime as needed for sleep. 30 tablet 1   No current facility-administered medications for this visit.     Musculoskeletal: Strength & Muscle Tone: UTA Gait & Station: UTA Patient leans: N/A  Psychiatric Specialty Exam: Review of Systems  Constitutional: Positive for fatigue (Received covid booster today).  Psychiatric/Behavioral: Negative for agitation, behavioral problems, confusion, decreased concentration, dysphoric mood, hallucinations, self-injury, sleep disturbance and suicidal ideas. The patient is not nervous/anxious and is not hyperactive.     There were no vitals taken for this visit.There is no height or weight on file to calculate BMI.  General Appearance: UTA  Eye Contact:  UTA  Speech:  Clear and  Coherent  Volume:  Normal  Mood:  Euthymic  Affect:  UTA  Thought Process:  Goal Directed and Descriptions of Associations: Intact  Orientation:  Full (Time, Place, and Person)  Thought Content: Logical   Suicidal Thoughts:  No  Homicidal Thoughts:  No  Memory:  Immediate;   Fair Recent;   Fair Remote;   Fair  Judgement:  Fair  Insight:  Fair  Psychomotor Activity:  UTA  Concentration:  Concentration: Fair and Attention Span: Fair  Recall:  [employer] of Knowledge: Fair  Language: Fair  Akathisia:  No  Handed:  Right  AIMS (if indicated): UTA  Assets:  Communication Skills Desire for Improvement Housing Intimacy Physical Health Social Support Talents/Skills Transportation Vocational/Educational  ADL's:  Intact  Cognition: WNL  Sleep:  Fair   Screenings:   Assessment and Plan: Tracey Lester is a 49 year old Caucasian female who has a history of MDD, insomnia, anxiety, ADD was evaluated by phone today.  Patient is currently making progress on the current medication regimen.  Plan as noted below.  Plan MDD in full remission Lamictal 100 mg p.o. daily Abilify 2 mg p.o. daily at bedtime Continue Zoloft 200 mg p.o. daily  GAD-stable Zoloft as prescribed Klonopin 0.5 mg as needed for severe panic attacks only  Insomnia-stable Ambien 10 mg p.o. nightly I have reviewed [place] controlled substance database. Hydroxyzine 50 mg p.o. nightly as needed  ADD-stable Adderall extended release 20 mg p.o. daily in the morning Adderall 15 mg p.o. daily after lunch I have reviewed [place] controlled substance database. I have provided 2 prescriptions for each with date specified, last one to be filled on or after 11/25/2020  Patient to continue CBT with her therapist as needed  Follow-up in clinic in 2 months or sooner if needed.  I have spent atleast 20 minutes non face to face with patient today. More than 50 % of the time was spent for preparing to see the patient ( e.g., review  of test, records ),  ordering medications and test ,psychoeducation and supportive psychotherapy and care coordination,as well as documenting clinical information in electronic health record. This note was generated in part or whole with voice recognition software. Voice recognition is usually quite accurate but there are transcription errors that can and very often do occur. I apologize for any typographical errors that were not detected and corrected.        Jomarie Longs, MD 10/17/2020, 9:23 AM

## 2020-12-06 ENCOUNTER — Other Ambulatory Visit: Payer: Self-pay | Admitting: Psychiatry

## 2020-12-06 DIAGNOSIS — F5105 Insomnia due to other mental disorder: Secondary | ICD-10-CM

## 2020-12-10 ENCOUNTER — Encounter (INDEPENDENT_AMBULATORY_CARE_PROVIDER_SITE_OTHER): Payer: Self-pay

## 2020-12-25 ENCOUNTER — Telehealth: Payer: Self-pay

## 2020-12-25 DIAGNOSIS — F3342 Major depressive disorder, recurrent, in full remission: Secondary | ICD-10-CM

## 2020-12-25 DIAGNOSIS — F411 Generalized anxiety disorder: Secondary | ICD-10-CM

## 2020-12-25 DIAGNOSIS — F5105 Insomnia due to other mental disorder: Secondary | ICD-10-CM

## 2020-12-25 DIAGNOSIS — F908 Attention-deficit hyperactivity disorder, other type: Secondary | ICD-10-CM

## 2020-12-25 MED ORDER — CLONAZEPAM 0.5 MG PO TABS: 0.5000 mg | ORAL_TABLET | ORAL | 1 refills | Status: DC

## 2020-12-25 MED ORDER — ARIPIPRAZOLE 2 MG PO TABS: 2.0000 mg | ORAL_TABLET | Freq: Every day | ORAL | 0 refills | Status: DC

## 2020-12-25 MED ORDER — AMPHETAMINE-DEXTROAMPHETAMINE 15 MG PO TABS: 15.0000 mg | ORAL_TABLET | Freq: Every day | ORAL | 0 refills | Status: DC

## 2020-12-25 MED ORDER — AMPHETAMINE-DEXTROAMPHET ER 20 MG PO CP24: 20.0000 mg | ORAL_CAPSULE | Freq: Every day | ORAL | 0 refills | Status: DC

## 2020-12-25 MED ORDER — LAMOTRIGINE 100 MG PO TABS: 100.0000 mg | ORAL_TABLET | Freq: Every day | ORAL | 0 refills | Status: DC

## 2020-12-25 NOTE — Telephone Encounter (Signed)
pt called left message that she is out of both of her adderall . at least send enough to get to her appt.

## 2020-12-25 NOTE — Telephone Encounter (Signed)
I have sent medication refill to pharmacy.

## 2020-12-30 ENCOUNTER — Telehealth: Payer: Self-pay

## 2020-12-30 ENCOUNTER — Telehealth (INDEPENDENT_AMBULATORY_CARE_PROVIDER_SITE_OTHER): Payer: 59 | Admitting: Psychiatry

## 2020-12-30 ENCOUNTER — Other Ambulatory Visit: Payer: Self-pay

## 2020-12-30 ENCOUNTER — Encounter: Payer: Self-pay | Admitting: Psychiatry

## 2020-12-30 DIAGNOSIS — F411 Generalized anxiety disorder: Secondary | ICD-10-CM | POA: Diagnosis not present

## 2020-12-30 DIAGNOSIS — F5105 Insomnia due to other mental disorder: Secondary | ICD-10-CM

## 2020-12-30 DIAGNOSIS — F908 Attention-deficit hyperactivity disorder, other type: Secondary | ICD-10-CM

## 2020-12-30 DIAGNOSIS — F3342 Major depressive disorder, recurrent, in full remission: Secondary | ICD-10-CM

## 2020-12-30 DIAGNOSIS — Z79899 Other long term (current) drug therapy: Secondary | ICD-10-CM

## 2020-12-30 MED ORDER — AMPHETAMINE-DEXTROAMPHET ER 20 MG PO CP24: 20.0000 mg | ORAL_CAPSULE | Freq: Every day | ORAL | 0 refills | Status: DC

## 2020-12-30 MED ORDER — ARIPIPRAZOLE 2 MG PO TABS: 1.0000 mg | ORAL_TABLET | Freq: Every day | ORAL | 0 refills | Status: DC

## 2020-12-30 MED ORDER — AMPHETAMINE-DEXTROAMPHETAMINE 15 MG PO TABS: 15.0000 mg | ORAL_TABLET | Freq: Every day | ORAL | 0 refills | Status: DC

## 2020-12-30 NOTE — Telephone Encounter (Signed)
faxed and confirmed order were sent to [facility]  tsh, prolactin, hemo a1c, lipid.  dx: [id number]

## 2020-12-30 NOTE — Progress Notes (Signed)
Virtual Visit via Video Note  I connected with Tracey Lester on 12/30/20 at  2:40 PM EST by a video enabled telemedicine application and verified that I am speaking with the correct person using two identifiers.  Location Provider Location : [facility] Patient Location : Home  Participants: Patient , Provider   I discussed the limitations of evaluation and management by telemedicine and the availability of in person appointments. The patient expressed understanding and agreed to proceed.    I discussed the assessment and treatment plan with the patient. The patient was provided an opportunity to ask questions and all were answered. The patient agreed with the plan and demonstrated an understanding of the instructions.   The patient was advised to call back or seek an in-person evaluation if the symptoms worsen or if the condition fails to improve as anticipated.  Video connection was lost at less than 50% of the duration of the visit, at which time the remainder of the visit was completed through audio only     [facility] Progress Note  12/30/2020 8:11 PM Tracey Lester  MRN:  [id number]  Chief Complaint:  Chief Complaint    Follow-up; Anxiety     HPI: Tracey Lester is a 50 year old Caucasian female, married, lives in [place], has a history of GAD, MDD, ADHD was evaluated by telemedicine today.  Patient today reports she had a good holiday season.  She reports she has been doing well with regards to her mood.  Denies any sadness, crying spells.  Denies any perceptual disturbances.  She is able to better cope with her anxiety symptoms.  She is compliant on her medications.  She does report weight gain of more than 10 pounds in the past 3 months.  She reports she and her husband are planning to start keto diet to lose weight.  Patient reports her attention and focus is good on the Adderall.  She denies side effects.  Patient reports sleep continues to be good.  She continues  to be motivated to stay in therapy with Ms. Felecia Janina Thompson.  She denies any suicidality or homicidality.  Patient denies any other concerns today.    Visit Diagnosis:    ICD-10-CM   1. GAD (generalized anxiety disorder)  F41.1 amphetamine-dextroamphetamine (ADDERALL) 15 MG tablet  2. MDD (major depressive disorder), recurrent, in full remission (HCC)  F33.42 ARIPiprazole (ABILIFY) 2 MG tablet  3. Insomnia due to mental disorder  F51.05 amphetamine-dextroamphetamine (ADDERALL XR) 20 MG 24 hr capsule    amphetamine-dextroamphetamine (ADDERALL XR) 20 MG 24 hr capsule  4. Attention deficit hyperactivity disorder (ADHD), other type  F90.8 amphetamine-dextroamphetamine (ADDERALL) 15 MG tablet  5. High risk medication use  Z79.899 TSH    Prolactin    Hemoglobin A1C    Lipid panel    Past Psychiatric History: I have reviewed past psychiatric history from my progress note on 01/06/2018  Past Medical History:  Past Medical History:  Diagnosis Date  . ADHD (attention deficit hyperactivity disorder)   . Anxiety   . Depression   . Herpes     Past Surgical History:  Procedure Laterality Date  . TONSILLECTOMY      Family Psychiatric History: I have reviewed family psychiatric history from my progress note on 01/06/2018  Family History:  Family History  Problem Relation Age of Onset  . Anxiety disorder Mother   . Depression Mother   . Hypertension Mother   . Anxiety disorder Father   . Depression Father   .  Hypertension Father   . Schizophrenia Paternal Grandmother   . Depression Paternal Grandmother   . Hearing loss Paternal Grandmother   . Hypertension Paternal Grandmother   . Depression Maternal Grandmother     Social History: I have reviewed social history from my progress note on 01/06/2018 Social History   Socioeconomic History  . Marital status: Married    Spouse name: michael  . Number of children: 3  . Years of education: Not on file  . Highest education level:  Associate degree: occupational, Scientist, product/process development, or vocational program  Occupational History  . Not on file  Tobacco Use  . Smoking status: Former Smoker    Types: Cigarettes    Quit date: 10/08/2006    Years since quitting: 14.2  . Smokeless tobacco: Never Used  Vaping Use  . Vaping Use: Never used  Substance and Sexual Activity  . Alcohol use: Yes    Alcohol/week: 5.0 standard drinks    Types: 4 Glasses of wine, 1 Cans of beer per week  . Drug use: No  . Sexual activity: Yes    Birth control/protection: None  Other Topics Concern  . Not on file  Social History Narrative  . Not on file   Social Determinants of Health   Financial Resource Strain: Not on file  Food Insecurity: Not on file  Transportation Needs: Not on file  Physical Activity: Not on file  Stress: Not on file  Social Connections: Not on file    Allergies:  Allergies  Allergen Reactions  . Codeine Itching    Metabolic Disorder Labs: No results found for: HGBA1C, MPG No results found for: PROLACTIN No results found for: CHOL, TRIG, HDL, CHOLHDL, VLDL, LDLCALC Lab Results  Component Value Date   TSH 2.59 08/09/2018    Therapeutic Level Labs: No results found for: LITHIUM No results found for: VALPROATE No components found for:  CBMZ  Current Medications: Current Outpatient Medications  Medication Sig Dispense Refill  . amphetamine-dextroamphetamine (ADDERALL XR) 20 MG 24 hr capsule Take 1 capsule (20 mg total) by mouth daily. 30 capsule 0  . [START ON 01/23/2021] amphetamine-dextroamphetamine (ADDERALL XR) 20 MG 24 hr capsule Take 1 capsule (20 mg total) by mouth daily. 30 capsule 0  . [START ON 02/21/2021] amphetamine-dextroamphetamine (ADDERALL XR) 20 MG 24 hr capsule Take 1 capsule (20 mg total) by mouth daily. 30 capsule 0  . amphetamine-dextroamphetamine (ADDERALL) 15 MG tablet Take 1 tablet by mouth daily. Daily at noon 30 tablet 0  . [START ON 01/23/2021] amphetamine-dextroamphetamine (ADDERALL)  15 MG tablet Take 1 tablet by mouth daily. At noon 30 tablet 0  . [START ON 02/21/2021] amphetamine-dextroamphetamine (ADDERALL) 15 MG tablet Take 1 tablet by mouth daily. Daily at noon 30 tablet 0  . ARIPiprazole (ABILIFY) 2 MG tablet Take 0.5 tablets (1 mg total) by mouth daily. 90 tablet 0  . clonazePAM (KLONOPIN) 0.5 MG tablet Take 1 tablet (0.5 mg total) by mouth as directed. Take it once a day as needed only for severe panic attacks 10 tablet 1  . dicyclomine (BENTYL) 10 MG capsule TAKE ONE CAPSULE BY MOUTH FOUR TIMES A DAY (BEFORE MEALS AND BEDTIME) 30 capsule 0  . hydrOXYzine (VISTARIL) 50 MG capsule Take 1 capsule (50 mg total) by mouth at bedtime as needed. For sleep (Patient not taking: Reported on 01/06/2019) 30 capsule 1  . lamoTRIgine (LAMICTAL) 100 MG tablet Take 1 tablet (100 mg total) by mouth daily. 90 tablet 0  . loratadine (CLARITIN) 10 MG  tablet Take 1 tablet (10 mg total) by mouth daily. 30 tablet 5  . meclizine (ANTIVERT) 25 MG tablet Take 1 tablet (25 mg total) by mouth 3 (three) times daily as needed for dizziness. 30 tablet 1  . sertraline (ZOLOFT) 100 MG tablet Take 2 tablets (200 mg total) by mouth daily. 180 tablet 1  . valACYclovir (VALTREX) 500 MG tablet Take 1 tablet (500 mg total) by mouth 2 (two) times daily. 6 tablet 0  . zolpidem (AMBIEN) 10 MG tablet TAKE ONE TABLET BY MOUTH EVERY NIGHT AT BEDTIME AS NEEDED FOR SLEEP 30 tablet 1   No current facility-administered medications for this visit.     Musculoskeletal: Strength & Muscle Tone: UTA Gait & Station: UTA Patient leans: N/A  Psychiatric Specialty Exam: Review of Systems  Psychiatric/Behavioral: The patient is nervous/anxious.   All other systems reviewed and are negative.   There were no vitals taken for this visit.There is no height or weight on file to calculate BMI.  General Appearance: UTA  Eye Contact:  UTA  Speech:  Clear and Coherent  Volume:  Normal  Mood:  Anxious Coping well  Affect:   UTA  Thought Process:  Goal Directed and Descriptions of Associations: Intact  Orientation:  Full (Time, Place, and Person)  Thought Content: Logical   Suicidal Thoughts:  No  Homicidal Thoughts:  No  Memory:  Immediate;   Fair Recent;   Fair Remote;   Fair  Judgement:  Fair  Insight:  Fair  Psychomotor Activity:  UTA  Concentration:  Concentration: Fair and Attention Span: Fair  Recall:  [employer] of Knowledge: Fair  Language: Fair  Akathisia:  No  Handed:  Right  AIMS (if indicated): UTA  Assets:  Communication Skills Desire for Improvement Housing Social Support  ADL's:  Intact  Cognition: WNL  Sleep:  Fair   Screenings: PHQ2-9   Flowsheet Row Video Visit from 12/30/2020 in [facility]  PHQ-2 Total Score 0    Flowsheet Row Video Visit from 12/30/2020 in [facility]  C-SSRS RISK CATEGORY No Risk       Assessment and Plan: Shi Grose is a 50 year old Caucasian female who has a history of MDD, insomnia, anxiety, ADD was evaluated by the medicine today.  Patient is currently stable on current medication regimen.  She will benefit from being tapered off of the Abilify gradually.  Discussed plan as noted below.  Plan MDD in full remission Lamictal 100 mg p.o. daily Reduce Abilify to 1 mg p.o. daily. Zoloft 200 mg p.o. daily PHQ 9 today equals 0  GAD-stable Zoloft as prescribed Klonopin 0.5 mg as needed for severe panic attacks only.  Insomnia-stable Ambien 10 mg p.o. nightly I have reviewed [place] controlled substance database Hydroxyzine 50 mg p.o. nightly as needed  ADD-stable Adderall extended release 20 mg p.o. daily in the morning Adderall 15 mg p.o. daily after lunch I have reviewed [place] controlled substance database I have provided 2 prescriptions with date specified last 1 to be filled on or after 02/21/2021  High risk medication use-will order the labs-TSH, lipid panel, hemoglobin A1c, prolactin.   She will go to [facility] lab.  Patient to continue CBT with her therapist as needed    Follow-up in clinic in 2-3 months in person in office.  I have spent atleast 30 minutes non face to face with patient today which includes the time spent for preparing to see the patient ( e.g., review of test,  records ),ordering medications and test ,psychoeducation and supportive psychotherapy and care coordination,as well as documenting clinical information in electronic health record.   This note was generated in part or whole with voice recognition software. Voice recognition is usually quite accurate but there are transcription errors that can and very often do occur. I apologize for any typographical errors that were not detected and corrected.      Jomarie Longs, MD 12/30/2020, 8:11 PM

## 2021-01-11 ENCOUNTER — Other Ambulatory Visit: Payer: Self-pay | Admitting: Psychiatry

## 2021-01-11 DIAGNOSIS — F3342 Major depressive disorder, recurrent, in full remission: Secondary | ICD-10-CM

## 2021-01-24 ENCOUNTER — Other Ambulatory Visit: Payer: Self-pay | Admitting: Psychiatry

## 2021-01-24 DIAGNOSIS — F3342 Major depressive disorder, recurrent, in full remission: Secondary | ICD-10-CM

## 2021-02-21 ENCOUNTER — Other Ambulatory Visit
Admission: RE | Admit: 2021-02-21 | Discharge: 2021-02-21 | Disposition: A | Discharge status: Home / Self Care | Payer: Managed Care, Other (non HMO) | Attending: Psychiatry | Admitting: Psychiatry

## 2021-02-21 ENCOUNTER — Other Ambulatory Visit: Payer: Self-pay

## 2021-02-21 DIAGNOSIS — Z79899 Other long term (current) drug therapy: Secondary | ICD-10-CM | POA: Diagnosis not present

## 2021-02-21 DIAGNOSIS — F908 Attention-deficit hyperactivity disorder, other type: Secondary | ICD-10-CM | POA: Diagnosis not present

## 2021-02-21 DIAGNOSIS — F3342 Major depressive disorder, recurrent, in full remission: Secondary | ICD-10-CM | POA: Insufficient documentation

## 2021-02-21 DIAGNOSIS — Z5181 Encounter for therapeutic drug level monitoring: Secondary | ICD-10-CM | POA: Insufficient documentation

## 2021-02-21 DIAGNOSIS — F411 Generalized anxiety disorder: Secondary | ICD-10-CM | POA: Insufficient documentation

## 2021-02-21 LAB — TSH: TSH: 1.198 u[IU]/mL (ref 0.350–4.500)

## 2021-02-21 LAB — LIPID PANEL
Cholesterol: 170 mg/dL (ref 0–200)
HDL: 69 mg/dL (ref >40)
LDL Cholesterol: 87 mg/dL (ref 0–99)
Total CHOL/HDL Ratio: 2.5 RATIO
Triglycerides: 68 mg/dL (ref <150)
VLDL: 14 mg/dL (ref 0–40)

## 2021-02-21 LAB — HEMOGLOBIN A1C
Hgb A1c MFr Bld: 5.3 % (ref 4.8–5.6)
Mean Plasma Glucose: 105.41 mg/dL

## 2021-02-22 LAB — PROLACTIN: Prolactin: 19.6 ng/mL (ref 4.8–23.3)

## 2021-02-26 ENCOUNTER — Telehealth: Payer: Self-pay

## 2021-02-26 DIAGNOSIS — F5105 Insomnia due to other mental disorder: Secondary | ICD-10-CM

## 2021-02-26 MED ORDER — ZOLPIDEM TARTRATE 10 MG PO TABS: ORAL_TABLET | ORAL | 3 refills | Status: DC

## 2021-02-26 NOTE — Telephone Encounter (Signed)
pt called wanted to check on the status of the medication refill for zolpidem

## 2021-02-26 NOTE — Telephone Encounter (Signed)
I have sent the Ambien to pharmacy.

## 2021-02-26 NOTE — Telephone Encounter (Signed)
received fax requesting a refill on the zolpidem  °

## 2021-03-18 ENCOUNTER — Other Ambulatory Visit: Payer: Self-pay

## 2021-03-18 ENCOUNTER — Encounter: Payer: Self-pay | Admitting: Psychiatry

## 2021-03-18 ENCOUNTER — Telehealth (INDEPENDENT_AMBULATORY_CARE_PROVIDER_SITE_OTHER): Payer: 59 | Admitting: Psychiatry

## 2021-03-18 DIAGNOSIS — F411 Generalized anxiety disorder: Secondary | ICD-10-CM

## 2021-03-18 DIAGNOSIS — F908 Attention-deficit hyperactivity disorder, other type: Secondary | ICD-10-CM | POA: Diagnosis not present

## 2021-03-18 DIAGNOSIS — Z79899 Other long term (current) drug therapy: Secondary | ICD-10-CM

## 2021-03-18 DIAGNOSIS — F5105 Insomnia due to other mental disorder: Secondary | ICD-10-CM

## 2021-03-18 DIAGNOSIS — F3342 Major depressive disorder, recurrent, in full remission: Secondary | ICD-10-CM

## 2021-03-18 MED ORDER — AMPHETAMINE-DEXTROAMPHET ER 20 MG PO CP24: 20.0000 mg | ORAL_CAPSULE | Freq: Every day | ORAL | 0 refills | Status: DC

## 2021-03-18 MED ORDER — AMPHETAMINE-DEXTROAMPHETAMINE 15 MG PO TABS: 15.0000 mg | ORAL_TABLET | Freq: Every day | ORAL | 0 refills | Status: DC

## 2021-03-18 MED ORDER — ARIPIPRAZOLE 2 MG PO TABS
1.0000 mg | ORAL_TABLET | ORAL | 1 refills | Status: DC
Start: 2021-03-18 — End: 2021-07-15

## 2021-03-18 NOTE — Progress Notes (Signed)
Virtual Visit via Video Note  I connected with Tracey AranShirley Aymond on 03/18/21 at 10:30 AM EDT by a video enabled telemedicine application and verified that I am speaking with the correct person using two identifiers.  Location Provider Location : [facility] Patient Location : Car  Participants: Patient , Provider   I discussed the limitations of evaluation and management by telemedicine and the availability of in person appointments. The patient expressed understanding and agreed to proceed.    I discussed the assessment and treatment plan with the patient. The patient was provided an opportunity to ask questions and all were answered. The patient agreed with the plan and demonstrated an understanding of the instructions.   The patient was advised to call back or seek an in-person evaluation if the symptoms worsen or if the condition fails to improve as anticipated.  Video connection was lost at less than 50% of the duration of the visit, at which time the remainder of the visit was completed through audio only   [facility]  03/18/2021 12:10 PM Tracey Lester  MRN:  [id number]  Chief Complaint:  Chief Complaint    Follow-up; Anxiety; Depression     HPI: Tracey Lester is a 50 year old Caucasian female, married, lives in [place], has a history of GAD, MDD, ADHD was evaluated by telemedicine today.  Patient today reports overall she is doing well.  She reports she is compliant on medications.  She currently denies any hallucinations or other perceptual disturbances.  She reports since she is doing well she would like to be tapered off of the Abilify.  She reports attention and focus is good.  Denies any side effects to the Adderall.  She reports she has not been using her clonazepam much anymore.  Patient has not had a chance to have psychotherapy sessions however is motivated to restart therapy.  She reports sleep is restless since her child needs her help at night and  wakes her up at least 5 times a week.  She however reports she tries to go to bed really early and that helps.  She feels rested when she wakes up in the morning.  Patient denies any suicidality, homicidality.  Patient denies any other concerns today.  Visit Diagnosis:    ICD-10-CM   1. GAD (generalized anxiety disorder)  F41.1 amphetamine-dextroamphetamine (ADDERALL) 15 MG tablet  2. MDD (major depressive disorder), recurrent, in full remission (HCC)  F33.42 ARIPiprazole (ABILIFY) 2 MG tablet  3. Insomnia due to mental disorder  F51.05 amphetamine-dextroamphetamine (ADDERALL) 15 MG tablet    amphetamine-dextroamphetamine (ADDERALL XR) 20 MG 24 hr capsule  4. Attention deficit hyperactivity disorder (ADHD), other type  F90.8 amphetamine-dextroamphetamine (ADDERALL XR) 20 MG 24 hr capsule  5. High risk medication use  Z79.899     Past Psychiatric History: I have reviewed past psychiatric history from progress note on 01/06/2018  Past Medical History:  Past Medical History:  Diagnosis Date  . ADHD (attention deficit hyperactivity disorder)   . Anxiety   . Depression   . Herpes     Past Surgical History:  Procedure Laterality Date  . TONSILLECTOMY      Family Psychiatric History: I have reviewed family psychiatric history from progress note on 01/06/2018  Family History:  Family History  Problem Relation Age of Onset  . Anxiety disorder Mother   . Depression Mother   . Hypertension Mother   . Anxiety disorder Father   . Depression Father   . Hypertension Father   .  Schizophrenia Paternal Grandmother   . Depression Paternal Grandmother   . Hearing loss Paternal Grandmother   . Hypertension Paternal Grandmother   . Depression Maternal Grandmother     Social History: I have reviewed social history from progress note on 01/06/2018 Social History   Socioeconomic History  . Marital status: Married    Spouse name: michael  . Number of children: 3  . Years of education: Not  on file  . Highest education level: Associate degree: occupational, Scientist, product/process development, or vocational program  Occupational History  . Not on file  Tobacco Use  . Smoking status: Former Smoker    Types: Cigarettes    Quit date: 10/08/2006    Years since quitting: 14.4  . Smokeless tobacco: Never Used  Vaping Use  . Vaping Use: Never used  Substance and Sexual Activity  . Alcohol use: Yes    Alcohol/week: 5.0 standard drinks    Types: 4 Glasses of wine, 1 Cans of beer per week  . Drug use: No  . Sexual activity: Yes    Birth control/protection: None  Other Topics Concern  . Not on file  Social History Narrative  . Not on file   Social Determinants of Health   Financial Resource Strain: Not on file  Food Insecurity: Not on file  Transportation Needs: Not on file  Physical Activity: Not on file  Stress: Not on file  Social Connections: Not on file    Allergies:  Allergies  Allergen Reactions  . Codeine Itching    Metabolic Disorder Labs: Lab Results  Component Value Date   HGBA1C 5.3 02/21/2021   MPG 105.41 02/21/2021   Lab Results  Component Value Date   PROLACTIN 19.6 02/21/2021   Lab Results  Component Value Date   CHOL 170 02/21/2021   TRIG 68 02/21/2021   HDL 69 02/21/2021   CHOLHDL 2.5 02/21/2021   VLDL 14 02/21/2021   LDLCALC 87 02/21/2021   Lab Results  Component Value Date   TSH 1.198 02/21/2021   TSH 2.59 08/09/2018    Therapeutic Level Labs: No results found for: LITHIUM No results found for: VALPROATE No components found for:  CBMZ  Current Medications: Current Outpatient Medications  Medication Sig Dispense Refill  . amphetamine-dextroamphetamine (ADDERALL XR) 20 MG 24 hr capsule Take 1 capsule (20 mg total) by mouth daily. 30 capsule 0  . [START ON 03/23/2021] amphetamine-dextroamphetamine (ADDERALL XR) 20 MG 24 hr capsule Take 1 capsule (20 mg total) by mouth daily. 30 capsule 0  . [START ON 04/20/2021] amphetamine-dextroamphetamine  (ADDERALL XR) 20 MG 24 hr capsule Take 1 capsule (20 mg total) by mouth daily. 30 capsule 0  . amphetamine-dextroamphetamine (ADDERALL) 15 MG tablet Take 1 tablet by mouth daily. Daily at noon 30 tablet 0  . [START ON 03/23/2021] amphetamine-dextroamphetamine (ADDERALL) 15 MG tablet Take 1 tablet by mouth daily. Daily at noon 30 tablet 0  . [START ON 04/20/2021] amphetamine-dextroamphetamine (ADDERALL) 15 MG tablet Take 1 tablet by mouth daily. At noon 30 tablet 0  . ARIPiprazole (ABILIFY) 2 MG tablet Take 0.5 tablets (1 mg total) by mouth as directed. Take every other day for 2 weeks and stop 45 tablet 1  . clonazePAM (KLONOPIN) 0.5 MG tablet Take 1 tablet (0.5 mg total) by mouth as directed. Take it once a day as needed only for severe panic attacks 10 tablet 1  . dicyclomine (BENTYL) 10 MG capsule TAKE ONE CAPSULE BY MOUTH FOUR TIMES A DAY (BEFORE MEALS AND BEDTIME) 30  capsule 0  . hydrOXYzine (VISTARIL) 50 MG capsule Take 1 capsule (50 mg total) by mouth at bedtime as needed. For sleep (Patient not taking: Reported on 01/06/2019) 30 capsule 1  . lamoTRIgine (LAMICTAL) 100 MG tablet Take 1 tablet (100 mg total) by mouth daily. 90 tablet 0  . loratadine (CLARITIN) 10 MG tablet Take 1 tablet (10 mg total) by mouth daily. 30 tablet 5  . meclizine (ANTIVERT) 25 MG tablet Take 1 tablet (25 mg total) by mouth 3 (three) times daily as needed for dizziness. 30 tablet 1  . sertraline (ZOLOFT) 100 MG tablet TAKE TWO TABLETS BY MOUTH DAILY 180 tablet 1  . valACYclovir (VALTREX) 500 MG tablet Take 1 tablet (500 mg total) by mouth 2 (two) times daily. 6 tablet 0  . zolpidem (AMBIEN) 10 MG tablet TAKE ONE TABLET BY MOUTH EVERY NIGHT AT BEDTIME AS NEEDED FOR SLEEP 30 tablet 3   No current facility-administered medications for this visit.     Musculoskeletal: Strength & Muscle Tone: UTA Gait & Station: UTA Patient leans: N/A  Psychiatric Specialty Exam: Review of Systems  Psychiatric/Behavioral: Positive  for sleep disturbance.  All other systems reviewed and are negative.   There were no vitals taken for this visit.There is no height or weight on file to calculate BMI.  General Appearance: Casual  Eye Contact:  Fair  Speech:  Normal Rate  Volume:  Normal  Mood:  Euthymic  Affect:  Congruent  Thought Process:  Goal Directed and Descriptions of Associations: Intact  Orientation:  Full (Time, Place, and Person)  Thought Content: Logical   Suicidal Thoughts:  No  Homicidal Thoughts:  No  Memory:  Immediate;   Fair Recent;   Fair Remote;   Fair  Judgement:  Fair  Insight:  Fair  Psychomotor Activity:  Normal  Concentration:  Concentration: Fair and Attention Span: Fair  Recall:  [employer] of Knowledge: Fair  Language: Fair  Akathisia:  No  Handed:  Right  AIMS (if indicated): UTA  Assets:  Communication Skills Desire for Improvement Housing Social Support  ADL's:  Intact  Cognition: WNL  Sleep:  Restless when her child needs help at night , but still feels rested since she tries to go to bed early   Screenings: GAD-7   Flowsheet Row Video Visit from [facility]/18/2021 in [facility]  Total GAD-7 Score 3    PHQ2-9   Flowsheet Row Video Visit from 03/18/2021 in [facility] Video Visit from 12/30/2020 in [facility]  PHQ-2 Total Score 0 0    Flowsheet Row Video Visit from 03/18/2021 in [facility] Video Visit from 12/30/2020 in [facility]  C-SSRS RISK CATEGORY No Risk No Risk       Assessment and Plan: Torah Pinnock is a 50 year old Caucasian female who has a history of MDD, insomnia, anxiety, ADD was evaluated by telemedicine today.  Patient is currently stable and is interested in being tapered off of the Abilify.  Plan as noted below.  Plan MDD in full remission Lamictal 100 mg p.o. daily Taper off Abilify.  Advised patient to take  Abilify 1 mg every other day for the next 2 weeks and stop taking it.  Patient advised to monitor her symptoms closely and if she notices any worsening mood symptoms she could go back on the Abilify 1 mg p.o. daily and let writer know. Zoloft 200 mg p.o. daily   GAD-stable Zoloft as  prescribed Klonopin 0.5 mg as needed for severe panic attacks only.  She rarely uses it.  Insomnia-stable Ambien 10 mg p.o. nightly I have reviewed [place] controlled substance database Hydroxyzine 50 mg p.o. nightly as needed  ADD-stable Adderall extended release 20 mg p.o. daily in the morning Adderall 15 mg p.o. daily after lunch I have reviewed [place] controlled substance database I have provided 2 prescriptions with date specified last one to be filled on or after 04/20/2021.  High risk medication use- I have reviewed the following labs-TSH-02/21/2021-within normal limits, prolactin-within normal limits, hemoglobin A1c-within normal limits, lipid panel-within normal limits.  Discussed the same with patient.  Patient to come into the office for the next visit.  Follow-up in clinic in 2 to 3 months or sooner if needed.   I have spent at least 25 minutes non face to face with patient today.      This note was generated in part or whole with voice recognition software. Voice recognition is usually quite accurate but there are transcription errors that can and very often do occur. I apologize for any typographical errors that were not detected and corrected.      Jomarie Longs, MD 03/18/2021, 12:10 PM

## 2021-04-05 ENCOUNTER — Other Ambulatory Visit: Payer: Self-pay | Admitting: Psychiatry

## 2021-04-05 DIAGNOSIS — F3342 Major depressive disorder, recurrent, in full remission: Secondary | ICD-10-CM

## 2021-05-16 ENCOUNTER — Encounter: Payer: Self-pay | Admitting: Psychiatry

## 2021-05-16 ENCOUNTER — Other Ambulatory Visit: Payer: Self-pay

## 2021-05-16 ENCOUNTER — Telehealth (INDEPENDENT_AMBULATORY_CARE_PROVIDER_SITE_OTHER): Payer: 59 | Admitting: Psychiatry

## 2021-05-16 DIAGNOSIS — F3342 Major depressive disorder, recurrent, in full remission: Secondary | ICD-10-CM

## 2021-05-16 DIAGNOSIS — F908 Attention-deficit hyperactivity disorder, other type: Secondary | ICD-10-CM

## 2021-05-16 DIAGNOSIS — F5105 Insomnia due to other mental disorder: Secondary | ICD-10-CM | POA: Diagnosis not present

## 2021-05-16 DIAGNOSIS — F411 Generalized anxiety disorder: Secondary | ICD-10-CM

## 2021-05-16 MED ORDER — AMPHETAMINE-DEXTROAMPHETAMINE 15 MG PO TABS: 15.0000 mg | ORAL_TABLET | Freq: Every day | ORAL | 0 refills | Status: DC

## 2021-05-16 MED ORDER — AMPHETAMINE-DEXTROAMPHET ER 20 MG PO CP24: 20.0000 mg | ORAL_CAPSULE | Freq: Every day | ORAL | 0 refills | Status: DC

## 2021-05-16 NOTE — Progress Notes (Signed)
Virtual Visit via Video Note  I connected with Tracey Lester on 05/16/21 at  8:30 AM EDT by a video enabled telemedicine application and verified that I am speaking with the correct person using two identifiers.  Location Provider Location : [facility] Patient Location : Home  Participants: Patient , Provider   I discussed the limitations of evaluation and management by telemedicine and the availability of in person appointments. The patient expressed understanding and agreed to proceed.    I discussed the assessment and treatment plan with the patient. The patient was provided an opportunity to ask questions and all were answered. The patient agreed with the plan and demonstrated an understanding of the instructions.   The patient was advised to call back or seek an in-person evaluation if the symptoms worsen or if the condition fails to improve as anticipated.  BH MD OP Progress Note  05/16/2021 10:00 AM Tracey Lester  MRN:  [id number]  Chief Complaint:  Chief Complaint   Follow-up; Anxiety; ADD    HPI: Tracey Lester is a 50 year old Caucasian female, married, lives in [place], has a history of GAD, MDD, ADHD was evaluated by telemedicine today.  Patient was supposed to come into the office for this visit however when she did not show up writer contacted patient over the phone and patient reported that her family member had tested positive for COVID.  Hence the visit was changed to MyChart video visit.  Patient today reports she is currently doing well.  Denies any significant depression.  Denies anxiety or restlessness.  Patient denies any suicidality or homicidality.  Patient reports sleep and appetite is fair.  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  That does help her a lot.  She continues to be in therapy and reports she meets with her therapist every month or so.  Therapy sessions are beneficial.  She has not had any  significant panic attacks and rarely takes the clonazepam.  She was able to taper herself off of the Abilify.  Patient reports concentration as good and she takes the Adderall as prescribed.  Denies side effects.  She denies any other concerns today.  Visit Diagnosis:    ICD-10-CM   1. GAD (generalized anxiety disorder)  F41.1 amphetamine-dextroamphetamine (ADDERALL) 15 MG tablet    2. MDD (major depressive disorder), recurrent, in full remission (HCC)  F33.42     3. Insomnia due to mental disorder  F51.05 amphetamine-dextroamphetamine (ADDERALL XR) 20 MG 24 hr capsule   ANXIETY    4. Attention deficit hyperactivity disorder (ADHD), other type  F90.8 amphetamine-dextroamphetamine (ADDERALL) 15 MG tablet    amphetamine-dextroamphetamine (ADDERALL XR) 20 MG 24 hr capsule      Past Psychiatric History: Reviewed past psychiatric history from progress note on 01/06/2018  Past Medical History:  Past Medical History:  Diagnosis Date   ADHD (attention deficit hyperactivity disorder)    Anxiety    Depression    Herpes     Past Surgical History:  Procedure Laterality Date   TONSILLECTOMY      Family Psychiatric History: Reviewed family psychiatric history from progress note on 01/06/2018  Family History:  Family History  Problem Relation Age of Onset   Anxiety disorder Mother    Depression Mother    Hypertension Mother    Anxiety disorder Father    Depression Father    Hypertension Father    Schizophrenia Paternal Grandmother    Depression Paternal Grandmother  Hearing loss Paternal Grandmother    Hypertension Paternal Grandmother    Depression Maternal Grandmother     Social History: Reviewed social history from progress note on 01/06/2018 Social History   Socioeconomic History   Marital status: Married    Spouse name: michael   Number of children: 3   Years of education: Not on file   Highest education level: Associate degree: occupational, Scientist, product/process development, or  vocational program  Occupational History   Not on file  Tobacco Use   Smoking status: Former    Types: Cigarettes    Quit date: 10/08/2006    Years since quitting: 14.6   Smokeless tobacco: Never  Vaping Use   Vaping Use: Never used  Substance and Sexual Activity   Alcohol use: Yes    Alcohol/week: 5.0 standard drinks    Types: 4 Glasses of wine, 1 Cans of beer per week   Drug use: No   Sexual activity: Yes    Birth control/protection: None  Other Topics Concern   Not on file  Social History Narrative   Not on file   Social Determinants of Health   Financial Resource Strain: Not on file  Food Insecurity: Not on file  Transportation Needs: Not on file  Physical Activity: Not on file  Stress: Not on file  Social Connections: Not on file    Allergies:  Allergies  Allergen Reactions   Codeine Itching    Metabolic Disorder Labs: Lab Results  Component Value Date   HGBA1C 5.3 02/21/2021   MPG 105.41 02/21/2021   Lab Results  Component Value Date   PROLACTIN 19.6 02/21/2021   Lab Results  Component Value Date   CHOL 170 02/21/2021   TRIG 68 02/21/2021   HDL 69 02/21/2021   CHOLHDL 2.5 02/21/2021   VLDL 14 02/21/2021   LDLCALC 87 02/21/2021   Lab Results  Component Value Date   TSH 1.198 02/21/2021   TSH 2.59 08/09/2018    Therapeutic Level Labs: No results found for: LITHIUM No results found for: VALPROATE No components found for:  CBMZ  Current Medications: Current Outpatient Medications  Medication Sig Dispense Refill   amphetamine-dextroamphetamine (ADDERALL XR) 20 MG 24 hr capsule Take 1 capsule (20 mg total) by mouth daily. 30 capsule 0   [START ON 06/19/2021] amphetamine-dextroamphetamine (ADDERALL XR) 20 MG 24 hr capsule Take 1 capsule (20 mg total) by mouth daily. 30 capsule 0   [START ON 05/21/2021] amphetamine-dextroamphetamine (ADDERALL XR) 20 MG 24 hr capsule Take 1 capsule (20 mg total) by mouth daily. 30 capsule 0    amphetamine-dextroamphetamine (ADDERALL) 15 MG tablet Take 1 tablet by mouth daily. Daily at noon 30 tablet 0   [START ON 05/21/2021] amphetamine-dextroamphetamine (ADDERALL) 15 MG tablet Take 1 tablet by mouth daily. Daily at noon 30 tablet 0   [START ON 06/19/2021] amphetamine-dextroamphetamine (ADDERALL) 15 MG tablet Take 1 tablet by mouth daily. At noon 30 tablet 0   ARIPiprazole (ABILIFY) 2 MG tablet Take 0.5 tablets (1 mg total) by mouth as directed. Take every other day for 2 weeks and stop 45 tablet 1   clonazePAM (KLONOPIN) 0.5 MG tablet Take 1 tablet (0.5 mg total) by mouth as directed. Take it once a day as needed only for severe panic attacks 10 tablet 1   dicyclomine (BENTYL) 10 MG capsule TAKE ONE CAPSULE BY MOUTH FOUR TIMES A DAY (BEFORE MEALS AND BEDTIME) 30 capsule 0   hydrOXYzine (VISTARIL) 50 MG capsule Take 1 capsule (50 mg total) by  mouth at bedtime as needed. For sleep (Patient not taking: Reported on 01/06/2019) 30 capsule 1   lamoTRIgine (LAMICTAL) 100 MG tablet Take 1 tablet (100 mg total) by mouth daily. 90 tablet 0   loratadine (CLARITIN) 10 MG tablet Take 1 tablet (10 mg total) by mouth daily. 30 tablet 5   meclizine (ANTIVERT) 25 MG tablet Take 1 tablet (25 mg total) by mouth 3 (three) times daily as needed for dizziness. 30 tablet 1   sertraline (ZOLOFT) 100 MG tablet TAKE TWO TABLETS BY MOUTH DAILY 180 tablet 1   valACYclovir (VALTREX) 500 MG tablet Take 1 tablet (500 mg total) by mouth 2 (two) times daily. 6 tablet 0   zolpidem (AMBIEN) 10 MG tablet TAKE ONE TABLET BY MOUTH EVERY NIGHT AT BEDTIME AS NEEDED FOR SLEEP 30 tablet 3   No current facility-administered medications for this visit.     Musculoskeletal: Strength & Muscle Tone:  UTA Gait & Station:  UTA Patient leans: N/A  Psychiatric Specialty Exam: Review of Systems  Psychiatric/Behavioral:  Negative for agitation, behavioral problems, confusion, decreased concentration, dysphoric mood, hallucinations,  self-injury, sleep disturbance and suicidal ideas. The patient is not nervous/anxious and is not hyperactive.   All other systems reviewed and are negative.  There were no vitals taken for this visit.There is no height or weight on file to calculate BMI.  General Appearance: Casual  Eye Contact:  Good  Speech:  Clear and Coherent  Volume:  Normal  Mood:  Euthymic  Affect:  Congruent  Thought Process:  Goal Directed and Descriptions of Associations: Intact  Orientation:  Full (Time, Place, and Person)  Thought Content: Logical   Suicidal Thoughts:  No  Homicidal Thoughts:  No  Memory:  Immediate;   Fair Recent;   Fair Remote;   Fair  Judgement:  Fair  Insight:  Fair  Psychomotor Activity:  Normal  Concentration:  Concentration: Fair and Attention Span: Fair  Recall:  [employer] of Knowledge: Fair  Language: Fair  Akathisia:  No  Handed:  Right  AIMS (if indicated): not done  Assets:  Communication Skills Desire for Improvement Resilience Social Support Talents/Skills Transportation Vocational/Educational  ADL's:  Intact  Cognition: WNL  Sleep:  Fair   Screenings: GAD-7    Flowsheet Row Video Visit from 05/16/2021 in [facility] Video Visit from 03/18/2021 in [facility]  Total GAD-7 Score 1 3      PHQ2-9    Flowsheet Row Video Visit from 05/16/2021 in [facility] Video Visit from 03/18/2021 in [facility] Video Visit from 12/30/2020 in [facility]  PHQ-2 Total Score 0 0 0  PHQ-9 Total Score 0 -- --      Flowsheet Row Video Visit from 05/16/2021 in [facility] Video Visit from 03/18/2021 in [facility] Video Visit from 12/30/2020 in [facility]  C-SSRS RISK CATEGORY No Risk No Risk No Risk        Assessment and Plan: Zayla Agar is a  51 year old Caucasian female who has a history of MDD, insomnia, anxiety, ADD was evaluated by telemedicine today.  Patient is currently stable.  Plan as noted below.  Plan MDD in full remission Lamictal 100 mg p.o. daily Discontinue Abilify-was tapered off. Zoloft 200 mg p.o. daily  GAD-stable Zoloft 200 mg daily Klonopin 0.5 mg as needed for severe panic attacks only.  Insomnia-stable Ambien 10 mg p.o. nightly I have reviewed  [place] PMP aware  ADHD-stable Adderall extended release 20 mg p.o. daily in the morning Adderall 15 mg p.o. daily after lunch Provided 2 prescriptions with date specified, last 1 to be filled on or after 06/19/2021. Reviewed [place] PMP aware  Discussed with patient to come into the office next visit since she needs her blood pressure and heart rate was evaluated.  Discussed with patient that if she does not show up for her appointment she cannot be marked as a no-show.  Follow-up in clinic in 2 months in office.  This note was generated in part or whole with voice recognition software. Voice recognition is usually quite accurate but there are transcription errors that can and very often do occur. I apologize for any typographical errors that were not detected and corrected.     Jomarie LongsSaramma Jadavion Spoelstra, MD 05/16/2021, 10:00 AM

## 2021-05-29 ENCOUNTER — Other Ambulatory Visit: Payer: Self-pay | Admitting: Psychiatry

## 2021-05-29 DIAGNOSIS — F411 Generalized anxiety disorder: Secondary | ICD-10-CM

## 2021-06-18 ENCOUNTER — Other Ambulatory Visit: Payer: Self-pay | Admitting: Psychiatry

## 2021-06-18 DIAGNOSIS — F3342 Major depressive disorder, recurrent, in full remission: Secondary | ICD-10-CM

## 2021-07-02 ENCOUNTER — Other Ambulatory Visit: Payer: Self-pay | Admitting: Psychiatry

## 2021-07-02 DIAGNOSIS — F5105 Insomnia due to other mental disorder: Secondary | ICD-10-CM

## 2021-07-09 ENCOUNTER — Telehealth: Payer: 59

## 2021-07-09 DIAGNOSIS — F5105 Insomnia due to other mental disorder: Secondary | ICD-10-CM

## 2021-07-09 MED ORDER — ZOLPIDEM TARTRATE 10 MG PO TABS: 10.0000 mg | ORAL_TABLET | Freq: Every evening | ORAL | 3 refills | Status: DC | PRN

## 2021-07-09 NOTE — Telephone Encounter (Signed)
Looks like a prescription was sent out for the beginning of September which did not go through and this was a second prescription.  I have changed the dating on this prescription so she can pick it up today.  Please let patient know to continue the Ambien.

## 2021-07-09 NOTE — Telephone Encounter (Signed)
pt called states that the pharmacy told her she couldn't get her zolpidem until 9-26--22.  pt wants to know why and that she needs something to help with sleep

## 2021-07-15 ENCOUNTER — Other Ambulatory Visit: Payer: Self-pay

## 2021-07-15 ENCOUNTER — Encounter: Payer: Self-pay | Admitting: Psychiatry

## 2021-07-15 ENCOUNTER — Ambulatory Visit (INDEPENDENT_AMBULATORY_CARE_PROVIDER_SITE_OTHER): Payer: 59 | Admitting: Psychiatry

## 2021-07-15 VITALS — BP 130/76 | HR 75 | Temp 97.8°F | Wt 151.4 lb

## 2021-07-15 DIAGNOSIS — F3342 Major depressive disorder, recurrent, in full remission: Secondary | ICD-10-CM | POA: Diagnosis not present

## 2021-07-15 DIAGNOSIS — F908 Attention-deficit hyperactivity disorder, other type: Secondary | ICD-10-CM

## 2021-07-15 DIAGNOSIS — F411 Generalized anxiety disorder: Secondary | ICD-10-CM

## 2021-07-15 DIAGNOSIS — F5105 Insomnia due to other mental disorder: Secondary | ICD-10-CM

## 2021-07-15 MED ORDER — AMPHETAMINE-DEXTROAMPHETAMINE 15 MG PO TABS: 15.0000 mg | ORAL_TABLET | Freq: Every day | ORAL | 0 refills | Status: DC

## 2021-07-15 MED ORDER — AMPHETAMINE-DEXTROAMPHET ER 20 MG PO CP24: 20.0000 mg | ORAL_CAPSULE | Freq: Every day | ORAL | 0 refills | Status: DC

## 2021-07-15 NOTE — Progress Notes (Signed)
BH MD OP Progress Note  07/15/2021 12:10 PM Tracey Lester  MRN:  [id number]  Chief Complaint:  Chief Complaint   Follow-up; Anxiety; Sleeping Problem    HPI: Tracey Lester is a 50 year old Caucasian female, married, lives in [place], has a history of GAD, MDD, ADHD was evaluated in office today.  Patient today reports since the past 1 month or so she has observed her self to be sleeping a lot.  She reports she wakes up in the morning and feels rested.  She however reports when she is done with getting her children to school she is tired and feels as though she needs to take a nap.  She reports throughout the day she has been taking more naps than before.  She however reports if she has an appointment or if there is any activity that she needs to do during the day she is able to do it, and her excessive sleepiness does not impair her ability to function, take care of herself, take care of her home or keep appointments.  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  Patient reports her current excessive sleepiness likely also due to her diving deeper into her spiritual practice of letting go and not stopping herself from doing certain things or feeling certain emotions like she would have done in the past.  She reports she has been teaching herself to detach herself from being on the go all the time and has been taking things more easy on her day-to-day life which could be contributing to her excessive naps during the day.  Patient does not believe she is depressed.  She however reports since she is practicing yoga she has been seeing her surroundings as brighter as well as feels more sensitive to certain events.  For example if she sees something good she cries easily.  Patient denies any hallucinations, other perceptual disturbances.  She denies any suicidality.  She denies abusing any substances.  Reports sleep is good at night  Does  report low energy however it does not impair her ability to function.  She has not been in psychotherapy for a while however agrees to get in touch with her therapist or establish care with therapist at this practice.  Patient is not interested in making any medication changes today.  She wants to keep track of her symptoms and will let writer know.    Visit Diagnosis:    ICD-10-CM   1. GAD (generalized anxiety disorder)  F41.1     2. MDD (major depressive disorder), recurrent, in full remission (HCC)  F33.42     3. Insomnia due to mental disorder  F51.05 amphetamine-dextroamphetamine (ADDERALL) 15 MG tablet    amphetamine-dextroamphetamine (ADDERALL XR) 20 MG 24 hr capsule   mood    4. Attention deficit hyperactivity disorder (ADHD), other type  F90.8 amphetamine-dextroamphetamine (ADDERALL XR) 20 MG 24 hr capsule    amphetamine-dextroamphetamine (ADDERALL) 15 MG tablet    amphetamine-dextroamphetamine (ADDERALL XR) 20 MG 24 hr capsule    amphetamine-dextroamphetamine (ADDERALL) 15 MG tablet      Past Psychiatric History: Reviewed past psychiatric history from progress note on 01/06/2018  Past Medical History:  Past Medical History:  Diagnosis Date   ADHD (attention deficit hyperactivity disorder)    Anxiety    Depression    Herpes     Past Surgical History:  Procedure Laterality Date   TONSILLECTOMY      Family Psychiatric History:  Reviewed family psychiatric history from progress note on 01/06/2018  Family History:  Family History  Problem Relation Age of Onset   Anxiety disorder Mother    Depression Mother    Hypertension Mother    Anxiety disorder Father    Depression Father    Hypertension Father    Schizophrenia Paternal Grandmother    Depression Paternal Grandmother    Hearing loss Paternal Grandmother    Hypertension Paternal Grandmother    Depression Maternal Grandmother     Social History: Reviewed social history from progress note on  01/06/2018 Social History   Socioeconomic History   Marital status: Married    Spouse name: Tracey Lester   Number of children: 3   Years of education: Not on file   Highest education level: Associate degree: occupational, Scientist, product/process development, or vocational program  Occupational History   Not on file  Tobacco Use   Smoking status: Former    Types: Cigarettes    Quit date: 10/08/2006    Years since quitting: 14.7   Smokeless tobacco: Never  Vaping Use   Vaping Use: Never used  Substance and Sexual Activity   Alcohol use: Yes    Alcohol/week: 5.0 standard drinks    Types: 4 Glasses of wine, 1 Cans of beer per week   Drug use: No   Sexual activity: Yes    Birth control/protection: None  Other Topics Concern   Not on file  Social History Narrative   Not on file   Social Determinants of Health   Financial Resource Strain: Not on file  Food Insecurity: Not on file  Transportation Needs: Not on file  Physical Activity: Not on file  Stress: Not on file  Social Connections: Not on file    Allergies:  Allergies  Allergen Reactions   Codeine Itching    Metabolic Disorder Labs: Lab Results  Component Value Date   HGBA1C 5.3 02/21/2021   MPG 105.41 02/21/2021   Lab Results  Component Value Date   PROLACTIN 19.6 02/21/2021   Lab Results  Component Value Date   CHOL 170 02/21/2021   TRIG 68 02/21/2021   HDL 69 02/21/2021   CHOLHDL 2.5 02/21/2021   VLDL 14 02/21/2021   LDLCALC 87 02/21/2021   Lab Results  Component Value Date   TSH 1.198 02/21/2021   TSH 2.59 08/09/2018    Therapeutic Level Labs: No results found for: LITHIUM No results found for: VALPROATE No components found for:  CBMZ  Current Medications: Current Outpatient Medications  Medication Sig Dispense Refill   [START ON 08/18/2021] amphetamine-dextroamphetamine (ADDERALL XR) 20 MG 24 hr capsule Take 1 capsule (20 mg total) by mouth daily. 30 capsule 0   [START ON 09/16/2021] amphetamine-dextroamphetamine  (ADDERALL XR) 20 MG 24 hr capsule Take 1 capsule (20 mg total) by mouth daily. 30 capsule 0   [START ON 08/18/2021] amphetamine-dextroamphetamine (ADDERALL) 15 MG tablet Take 1 tablet by mouth daily. 30 tablet 0   [START ON 09/16/2021] amphetamine-dextroamphetamine (ADDERALL) 15 MG tablet Take 1 tablet by mouth daily. 30 tablet 0   clonazePAM (KLONOPIN) 0.5 MG tablet TAKE ONE TABLET BY MOUTH DAILY AS NEEDED ONLY FOR SEVERE PANIC ATTACKS 10 tablet 1   dicyclomine (BENTYL) 10 MG capsule TAKE ONE CAPSULE BY MOUTH FOUR TIMES A DAY (BEFORE MEALS AND BEDTIME) 30 capsule 0   hydrOXYzine (VISTARIL) 50 MG capsule Take 1 capsule (50 mg total) by mouth at bedtime as needed. For sleep 30 capsule 1   lamoTRIgine (LAMICTAL) 100 MG tablet  TAKE ONE TABLET BY MOUTH DAILY 90 tablet 0   loratadine (CLARITIN) 10 MG tablet Take 1 tablet (10 mg total) by mouth daily. 30 tablet 5   meclizine (ANTIVERT) 25 MG tablet Take 1 tablet (25 mg total) by mouth 3 (three) times daily as needed for dizziness. 30 tablet 1   sertraline (ZOLOFT) 100 MG tablet TAKE TWO TABLETS BY MOUTH DAILY 180 tablet 1   valACYclovir (VALTREX) 500 MG tablet Take 1 tablet (500 mg total) by mouth 2 (two) times daily. 6 tablet 0   zolpidem (AMBIEN) 10 MG tablet Take 1 tablet (10 mg total) by mouth at bedtime as needed for sleep. 30 tablet 3   [START ON 07/21/2021] amphetamine-dextroamphetamine (ADDERALL XR) 20 MG 24 hr capsule Take 1 capsule (20 mg total) by mouth daily. 30 capsule 0   [START ON 07/21/2021] amphetamine-dextroamphetamine (ADDERALL) 15 MG tablet Take 1 tablet by mouth daily. Daily at noon 30 tablet 0   No current facility-administered medications for this visit.     Musculoskeletal: Strength & Muscle Tone: within normal limits Gait & Station: normal Patient leans: N/A  Psychiatric Specialty Exam: Review of Systems  Psychiatric/Behavioral:  Positive for sleep disturbance.   All other systems reviewed and are negative.  Blood  pressure 130/76, pulse 75, temperature 97.8 F (36.6 C), temperature source Temporal, weight 151 lb 6.4 oz (68.7 kg).Body mass index is 21.72 kg/m.  General Appearance: Casual  Eye Contact:  Fair  Speech:  Clear and Coherent  Volume:  Normal  Mood:  Euthymic  Affect:  Congruent  Thought Process:  Goal Directed and Descriptions of Associations: Intact  Orientation:  Full (Time, Place, and Person)  Thought Content: Logical   Suicidal Thoughts:  No  Homicidal Thoughts:  No  Memory:  Immediate;   Fair Recent;   Fair Remote;   Fair  Judgement:  Fair  Insight:  Fair  Psychomotor Activity:  Normal  Concentration:  Concentration: Fair and Attention Span: Fair  Recall:  [employer] of Knowledge: Fair  Language: Fair  Akathisia:  No  Handed:  Right  AIMS (if indicated): done  Assets:  Communication Skills Desire for Improvement Housing Intimacy Leisure Time Resilience Social Support Talents/Skills Transportation Vocational/Educational  ADL's:  Intact  Cognition: WNL  Sleep:   excessive   Screenings: GAD-7    Flowsheet Row Video Visit from 05/16/2021 in [facility] Video Visit from 03/18/2021 in [facility]  Total GAD-7 Score 1 3      PHQ2-9    Flowsheet Row Office Visit from 07/15/2021 in [facility] Video Visit from 05/16/2021 in [facility] Video Visit from 03/18/2021 in [facility] Video Visit from 12/30/2020 in [facility]  PHQ-2 Total Score 0 0 0 0  PHQ-9 Total Score 6 0 -- --      Flowsheet Row Video Visit from 05/16/2021 in [facility] Video Visit from 03/18/2021 in [facility] Video Visit from 12/30/2020 in [facility]  C-SSRS RISK CATEGORY No Risk No Risk No Risk        Assessment and Plan: Sharice Harriss is a  50 year old Caucasian female who has a history of MDD, insomnia, anxiety, ADD was evaluated in office today.  Patient reports excessive naps during the day however denies any significant sadness, anxiety.  She will benefit from the following plan.  Plan MDD in full remission Lamotrigine 100 mg p.o. daily Zoloft  200 mg p.o. daily  GAD-stable Zoloft 200 mg p.o. daily Klonopin 0.5 mg as needed for severe panic attacks  Insomnia-stable Ambien 10 mg p.o. nightly as needed  Hypersomnia-unstable Patient does report taking excessive naps during the day. Completed epworth sleepiness scale-scored 12  Patient to keep a log of her sleep problems.  She is not interested in referral for sleep study at this time.  She will Pharmacist, hospital know.  ADHD-stable Adderall extended release 20 mg p.o. daily in the morning Adderall 15 mg p.o. daily after lunch Provided 3 prescriptions-last 1 to be filled on or after 09/16/2021  I will communicate with therapist Ms. [name].  Patient to make a decision about reaching out to Ms. [name] for continued therapy appointments or schedule an appointment with therapist at our practice.  Crisis plan discussed with patient.  We will consider restarting Abilify if she notices any mood symptoms.  Follow-up in clinic in 2-3 months in person or sooner if needed.  I have spent atleast 30 minutes face to face with patient today which includes the time spent for preparing to see the patient ( e.g., review of test, records ), obtaining and to review and separately obtained history , ordering medications and test ,psychoeducation and supportive psychotherapy and care coordination,as well as documenting clinical information in electronic health record,interpreting and communication of test results.   This note was generated in part or whole with voice recognition software. Voice recognition is usually quite accurate but there are transcription errors that can and very  often do occur. I apologize for any typographical errors that were not detected and corrected.      Jomarie Longs, MD 07/16/2021, 8:27 AM

## 2021-07-16 ENCOUNTER — Ambulatory Visit (HOSPITAL_BASED_OUTPATIENT_CLINIC_OR_DEPARTMENT_OTHER): Payer: 59 | Admitting: Licensed Clinical Social Worker

## 2021-07-16 ENCOUNTER — Other Ambulatory Visit: Payer: Self-pay | Admitting: Psychiatry

## 2021-07-16 DIAGNOSIS — F411 Generalized anxiety disorder: Secondary | ICD-10-CM

## 2021-07-16 DIAGNOSIS — F330 Major depressive disorder, recurrent, mild: Secondary | ICD-10-CM | POA: Diagnosis not present

## 2021-07-16 NOTE — Plan of Care (Signed)
Developed plan 

## 2021-07-16 NOTE — Progress Notes (Signed)
Virtual Visit via Video Note  I connected with [name] on 07/16/21 at  1:00 PM EDT by a video enabled telemedicine application and verified that I am speaking with the correct person using two identifiers.  Location: Patient: home Provider: [facility]   I discussed the limitations of evaluation and management by telemedicine and the availability of in person appointments. The patient expressed understanding and agreed to proceed.    I discussed the assessment and treatment plan with the patient. The patient was provided an opportunity to ask questions and all were answered. The patient agreed with the plan and demonstrated an understanding of the instructions.   The patient was advised to call back or seek an in-person evaluation if the symptoms worsen or if the condition fails to improve as anticipated.  I provided 60 minutes of non-face-to-face time during this encounter.   Caylee Vlachos R Goldie Dimmer, LCSW   THERAPIST PROGRESS NOTE  Session Time: 1-2p  Participation Level: Active  Behavioral Response: Neat and Well GroomedAlertAnxious and Depressed  Type of Therapy: Individual Therapy  Treatment Goals addressed: Developed goals and tx plan  Interventions: CBT and DBT  Summary: Chellie Vanlue is a 50 y.o. female who presents with continuing symptoms related to anxiety diagnosis. Patient reports that overall mood is stable and that she feels she is managing stress well. Patient reports fair quality and quantity of sleep.  Allow patients a space to explore and express thoughts and feelings associated with recent external stressors and life events. Completed assessment and development of treatment plan.  Continued recommendations are as follows: self care behaviors, positive social engagements, focusing on overall work/home/life balance, and focusing on positive physical and emotional wellness.  .   Suicidal/Homicidal: No  Therapist Response: Assessment and developed treatment  plan--treatment to continue as indicated.  Plan: Return again in 4 weeks.  Diagnosis: Axis I: MDD, recurrent, mild; GAD    Axis II: No diagnosis    Ernest Haber Ardith Test, LCSW 07/16/2021

## 2021-07-17 NOTE — Progress Notes (Signed)
Comprehensive Clinical Assessment (CCA) Note  07/17/2021 Tracey Lester [id number]  Chief Complaint: No chief complaint on file.  Visit Diagnosis: GAD    CCA Screening, Triage and Referral (STR) *paperwork completed by patient and/or parent/guardian and scanned in chart  CCA Biopsychosocial Intake/Chief Complaint:  anxiety  Current Symptoms/Problems: dissasociation; anxiety; racing thought   Patient Reported Schizophrenia/Schizoaffective Diagnosis in Past: No   Strengths: strong supports; good self awareness skills  Preferences: outpatient psychiatric support  Abilities: yoga  instructor   Type of Services Patient Feels are Needed: medication management; counseling   Initial Clinical Notes/Concerns: No data recorded  Mental Health Symptoms Depression:   Difficulty Concentrating; Fatigue; Sleep (too much or little); Worthlessness   Duration of Depressive symptoms:  Greater than two weeks   Mania:   Racing thoughts   Anxiety:    Worrying; Sleep; Tension; Restlessness; Irritability; Fatigue; Difficulty concentrating   Psychosis:   None   Duration of Psychotic symptoms: No data recorded  Trauma:   Detachment from others; Emotional numbing; Guilt/shame   Obsessions:   None   Compulsions:   None   Inattention:   Avoids/dislikes activities that require focus; Fails to pay attention/makes careless mistakes; Poor follow-through on tasks   Hyperactivity/Impulsivity:   None   Oppositional/Defiant Behaviors:   None   Emotional Irregularity:   Mood lability   Other Mood/Personality Symptoms:  No data recorded   Mental Status Exam Appearance and self-care  Stature:   Average   Weight:   Average weight   Clothing:   Neat/clean   Grooming:   Normal   Cosmetic use:   None   Posture/gait:   Normal   Motor activity:   Not Remarkable   Sensorium  Attention:   Normal   Concentration:   Normal   Orientation:   X5   Recall/memory:    Normal   Affect and Mood  Affect:   Appropriate   Mood:   Anxious   Relating  Eye contact:   Normal   Facial expression:   Anxious   Attitude toward examiner:   Cooperative   Thought and Language  Speech flow:  Clear and Coherent   Thought content:   Appropriate to Mood and Circumstances   Preoccupation:   None   Hallucinations:   None   Organization:  No data recorded  [employer] of Knowledge:   Good   Intelligence:   Average   Abstraction:   Functional   Judgement:   Good   Reality Testing:   Realistic   Insight:   Good   Decision Making:   Normal   Social Functioning  Social Maturity:   Responsible   Social Judgement:   Normal   Stress  Stressors:  No data recorded  Coping Ability:   Human resources officer Deficits:  No data recorded  Supports:   Family     Religion:    Leisure/Recreation: Leisure / Recreation Do You Have Hobbies?: Yes Leisure and Hobbies: spending time with family  Exercise/Diet: Exercise/Diet Do You Exercise?: Yes What Type of Exercise Do You Do?:  (yoga) Do You Have Any Trouble Sleeping?: Yes Explanation of Sleeping Difficulties: hard to fall asleep at times   CCA Employment/Education Employment/Work Situation: Employment / Work Situation Has Patient ever Been in Equities trader?: No  Education: Education Is Patient Currently Attending School?: No   CCA Family/Childhood History Family and Relationship History: Family history Marital status: Married Additional relationship information: stable relationship Are you sexually active?:  Yes Does patient have children?: Yes How many children?: 3 How is patient's relationship with their children?: stable relationship with children   CCA Substance Use Alcohol/Drug Use: Alcohol / Drug Use Pain Medications: SEE MAR Prescriptions: SEE MAR Over the Counter: SEE MAR History of alcohol / drug use?: Yes Substance #1 Name of Substance  1: ETOH 1 - Amount (size/oz): variable--several glasses of wine 1 - Frequency: daily 1 - Duration: variable/daily 1 - Last Use / Amount: 2.5 years ago 1 - Method of Aquiring: legal/store 1- Route of Use: oral/drink   ASAM's:  Six Dimensions of Multidimensional Assessment  Dimension 1:  Acute Intoxication and/or Withdrawal Potential:      Dimension 2:  Biomedical Conditions and Complications:      Dimension 3:  Emotional, Behavioral, or Cognitive Conditions and Complications:     Dimension 4:  Readiness to Change:     Dimension 5:  Relapse, Continued use, or Continued Problem Potential:     Dimension 6:  Recovery/Living Environment:     ASAM Severity Score:    ASAM Recommended Level of Treatment:     Substance use Disorder (SUD)   None  Recommendations for Services/Supports/Treatments: Recommendations for Services/Supports/Treatments Recommendations For Services/Supports/Treatments: Medication Management, Individual Therapy  DSM5 Diagnoses: Patient Active Problem List   Diagnosis Date Noted   MDD (major depressive disorder), recurrent, severe, with psychosis (HCC) 09/17/2020   MDD (major depressive disorder), recurrent, in full remission (HCC) 04/22/2020   High risk medication use 04/22/2020   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   GAD (generalized anxiety disorder) 05/31/2019   MDD (major depressive disorder), recurrent episode, mild (HCC) 05/31/2019   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

## 2021-08-18 ENCOUNTER — Ambulatory Visit (INDEPENDENT_AMBULATORY_CARE_PROVIDER_SITE_OTHER): Payer: 59 | Admitting: Licensed Clinical Social Worker

## 2021-08-18 ENCOUNTER — Other Ambulatory Visit: Payer: Self-pay

## 2021-08-18 DIAGNOSIS — F411 Generalized anxiety disorder: Secondary | ICD-10-CM

## 2021-08-18 NOTE — Plan of Care (Signed)
  Problem: Decrease depressive symptoms and improve levels of effective functioning Goal: LTG: Reduce frequency, intensity, and duration of depression symptoms as evidenced by: pt self report Outcome: Progressing Goal: STG: [name] @ will participate in at least 80% of scheduled individual psychotherapy sessions Outcome: Progressing Intervention: Work with [name] @ to identify the major components of a recent episode of depression: physical symptoms, major thoughts and images, and major behaviors they experienced Intervention: Teach coping skills and behavior Intervention: Review PLEASE Skills (Treat Physical Illness, Balance Eating, Avoid Mood-Altering Substances, Balance Sleep and Get Exercise) with [name] @   Problem: Reduce overall frequency, intensity, and duration of the anxiety so that daily functioning is not impaired. Goal: LTG: Patient will score less than 5 on the Generalized Anxiety Disorder 7 Scale (GAD-7) Outcome: Progressing Goal: STG: Patient will participate in at least 80% of scheduled individual psychotherapy sessions Outcome: Progressing Intervention: Teach coping skills and behavior Intervention: Assist with relaxation techniques, as appropriate (deep breathing exercises, meditation, guided imagery) Intervention: Encourage patient to identify triggers

## 2021-08-18 NOTE — Progress Notes (Signed)
Virtual Visit via Video Note  I connected with [name] on 08/18/21 at 11:00 AM EDT by a video enabled telemedicine application and verified that I am speaking with the correct person using two identifiers.  Location: Patient: home  Provider: remote office [place], [place])   I discussed the limitations of evaluation and management by telemedicine and the availability of in person appointments. The patient expressed understanding and agreed to proceed.   I discussed the assessment and treatment plan with the patient. The patient was provided an opportunity to ask questions and all were answered. The patient agreed with the plan and demonstrated an understanding of the instructions.   The patient was advised to call back or seek an in-person evaluation if the symptoms worsen or if the condition fails to improve as anticipated.  I provided 60 minutes of non-face-to-face time during this encounter.   Leighton Brickley R Yaira Bernardi, LCSW   THERAPIST PROGRESS NOTE  Session Time: 11a-12p  Participation Level: Active  Behavioral Response: NeatAlertAnxious  Type of Therapy: Individual Therapy  Treatment Goals addressed:  Goal: LTG: Patient will score less than 5 on the Generalized Anxiety Disorder 7 Scale (GAD-7) Outcome: Progressing  Goal: STG: Patient will participate in at least 80% of scheduled individual psychotherapy sessions Outcome: Progressing  Goal: LTG: Reduce frequency, intensity, and duration of depression symptoms as evidenced by: pt self report Outcome: Progressing  Goal: STG: [name] @ will participate in at least 80% of scheduled individual psychotherapy sessions Outcome: Progressing  Interventions:  Intervention: Work with [name] @ to identify the major components of a recent episode of depression: physical symptoms, major thoughts and images, and major behaviors they experienced  Intervention: Teach coping skills and behavior  Intervention: Review  PLEASE Skills (Treat Physical Illness, Balance Eating, Avoid Mood-Altering Substances, Balance Sleep and Get Exercise) with [name] @   Intervention: Teach coping skills and behavior Intervention: Assist with relaxation techniques, as appropriate (deep breathing exercises, meditation, guided imagery)  Intervention: Encourage patient to identify triggers Summary: Monique Hefty is a 50 y.o. female who presents with improving symptoms related to depression and anxiety diagnosis. Pt reports that she is continuing to read "the body keeps the score" book.   Allowed pt to explore and express thoughts and feelings associated with recent life situations and external stressors. Discussed pt relationships and importance of setting boundaries.   Continued recommendations are as follows: self care behaviors, positive social engagements, focusing on overall work/home/life balance, and focusing on positive physical and emotional wellness. .   Suicidal/Homicidal: No  Therapist Response: Pt is continuing to apply interventions learned in session into daily life situations. Pt is currently on track to meet goals utilizing interventions mentioned above. Personal growth and progress noted. Treatment to continue as indicated.   Plan: Return again in 4 weeks.  Diagnosis: Axis I: GAD    Axis II: No diagnosis    Amarien Carne, LCSW 08/18/2021

## 2021-08-19 ENCOUNTER — Other Ambulatory Visit: Payer: Self-pay | Admitting: Psychiatry

## 2021-08-19 DIAGNOSIS — F3342 Major depressive disorder, recurrent, in full remission: Secondary | ICD-10-CM

## 2021-09-10 ENCOUNTER — Telehealth: Payer: Self-pay

## 2021-09-10 DIAGNOSIS — F3342 Major depressive disorder, recurrent, in full remission: Secondary | ICD-10-CM

## 2021-09-10 MED ORDER — LAMOTRIGINE 100 MG PO TABS: 100.0000 mg | ORAL_TABLET | Freq: Every day | ORAL | 0 refills | Status: DC

## 2021-09-10 NOTE — Telephone Encounter (Signed)
Reviewed lamotrigine prescription, sent to pharmacy.

## 2021-09-10 NOTE — Telephone Encounter (Signed)
received a fax requesting a refill on the lamotrigine 100mg 

## 2021-09-23 ENCOUNTER — Other Ambulatory Visit: Payer: Self-pay

## 2021-09-23 ENCOUNTER — Ambulatory Visit (INDEPENDENT_AMBULATORY_CARE_PROVIDER_SITE_OTHER): Payer: 59 | Admitting: Licensed Clinical Social Worker

## 2021-09-23 DIAGNOSIS — F411 Generalized anxiety disorder: Secondary | ICD-10-CM | POA: Diagnosis not present

## 2021-09-23 NOTE — Progress Notes (Signed)
Virtual Visit via Video Note  I connected with Tracey Lester on 09/23/21 at 10:00 AM EST by a video enabled telemedicine application and verified that I am speaking with the correct person using two identifiers.  Location: Patient: home  Provider: remote office [place], [place])   I discussed the limitations of evaluation and management by telemedicine and the availability of in person appointments. The patient expressed understanding and agreed to proceed.   I discussed the assessment and treatment plan with the patient. The patient was provided an opportunity to ask questions and all were answered. The patient agreed with the plan and demonstrated an understanding of the instructions.   The patient was advised to call back or seek an in-person evaluation if the symptoms worsen or if the condition fails to improve as anticipated.  I provided 60 minutes of non-face-to-face time during this encounter.   Zamara Cozad R Juventino Pavone, LCSW   THERAPIST PROGRESS NOTE  Session Time: 10-11a  Participation Level: Active  Behavioral Response: NeatAlertAnxious  Type of Therapy: Individual Therapy  Treatment Goals addressed:  Goal: LTG: Patient will score less than 5 on the Generalized Anxiety Disorder 7 Scale (GAD-7) Outcome: Progressing  Goal: STG: Patient will participate in at least 80% of scheduled individual psychotherapy sessions Outcome: Progressing  Goal: LTG: Reduce frequency, intensity, and duration of depression symptoms as evidenced by: pt self report Outcome: Progressing  Goal: STG: [name] @ will participate in at least 80% of scheduled individual psychotherapy sessions Outcome: Progressing  Interventions:   Intervention: Review PLEASE Skills (Treat Physical Illness, Balance Eating, Avoid Mood-Altering Substances, Balance Sleep and Get Exercise) with [name] @   Intervention: Review coping skills and behavior  Intervention: Assist with relaxation techniques, as  appropriate (deep breathing exercises, meditation, guided imagery)  Intervention: Encourage patient to identify triggers  Summary: Morna Flud is a 50 y.o. female who presents with improving symptoms related to depression and anxiety diagnosis. Pt reports that she is continuing to read self help books. Pt reports that overall mood is stable.  Allowed pt to explore and express thoughts and feelings associated with recent life situations and external stressors. Focused on cognitive distortions, importance of self care activities,  physically showing up for yoga, structuring daily activities, overeating and exploring psychological impact of lifelong trauma and events.   Continued recommendations are as follows: self care behaviors, positive social engagements, focusing on overall work/home/life balance, and focusing on positive physical and emotional wellness. .   Suicidal/Homicidal: No  Therapist Response: Pt is continuing to apply interventions learned in session into daily life situations. Pt is currently on track to meet goals utilizing interventions mentioned above. Personal growth and progress noted. Treatment to continue as indicated.   Plan: Return again in 4 weeks.  Diagnosis: Axis I: GAD    Axis II: No diagnosis    Tracey Lester Dashonna Chagnon, LCSW 09/23/2021

## 2021-10-07 ENCOUNTER — Ambulatory Visit (INDEPENDENT_AMBULATORY_CARE_PROVIDER_SITE_OTHER): Payer: 59 | Admitting: Licensed Clinical Social Worker

## 2021-10-07 ENCOUNTER — Other Ambulatory Visit: Payer: Self-pay

## 2021-10-07 DIAGNOSIS — F411 Generalized anxiety disorder: Secondary | ICD-10-CM

## 2021-10-07 NOTE — Progress Notes (Signed)
Virtual Visit via Audio Note  I connected with Tracey Lester on 10/07/21 at 10:00 AM EST by an audio enabled telemedicine application and verified that I am speaking with the correct person using two identifiers.  Video connection was lost when less than 50% of the duration of the visit was complete, at which time the remainder of the visit was completed via audio only.  Pt stated that husband was in another room on a Zoom call which could be interfering with overall bandwidth.  Location: Patient: home  Provider: remote office [place], [place])   I discussed the limitations of evaluation and management by telemedicine and the availability of in person appointments. The patient expressed understanding and agreed to proceed.   I discussed the assessment and treatment plan with the patient. The patient was provided an opportunity to ask questions and all were answered. The patient agreed with the plan and demonstrated an understanding of the instructions.   The patient was advised to call back or seek an in-person evaluation if the symptoms worsen or if the condition fails to improve as anticipated.  I provided 60 minutes of non-face-to-face time during this encounter.   Tracey Lester R Tracey Hooper, LCSW   THERAPIST PROGRESS NOTE  Session Time: 10-11a  Participation Level: Active  Behavioral Response: NeatAlertAnxious  Type of Therapy: Individual Therapy  Treatment Goals addressed:  Goal: LTG: Patient will score less than 5 on the Generalized Anxiety Disorder 7 Scale (GAD-7) Outcome: Progressing  Goal: STG: Patient will participate in at least 80% of scheduled individual psychotherapy sessions Outcome: Progressing  Goal: LTG: Reduce frequency, intensity, and duration of depression symptoms as evidenced by: pt self report Outcome: Progressing  Goal: STG: [name] @ will participate in at least 80% of scheduled individual psychotherapy sessions Outcome:  Progressing  Interventions:   Intervention: Review PLEASE Skills (Treat Physical Illness, Balance Eating, Avoid Mood-Altering Substances, Balance Sleep and Get Exercise) with [name] @   Intervention: Review coping skills and behavior  Intervention: Assist with relaxation techniques, as appropriate (deep breathing exercises, meditation, guided imagery)  Intervention: Encourage patient to identify triggers  Summary: Tracey Lester is a 50 y.o. female who presents with improving symptoms related to depression and anxiety diagnosis. Pt reports that overall mood is stable. Pt reports good quality and quantity of sleep.  Allowed pt to explore and express thoughts and feelings associated with recent life situations and external stressors. Discussed growth since last session: pt feels she is managing her time better and making time for self care. Pt also reports that she feels her overall self awareness has improved. Pt feels that she is getting up and dressed earlier in the day and this is helping with overall motivation and initiative. Reviewed methods of regulating emotion--pt feels that she needed some help with this.   Continued recommendations are as follows: self care behaviors, positive social engagements, focusing on overall work/home/life balance, and focusing on positive physical and emotional wellness. .   Suicidal/Homicidal: No  Therapist Response: Pt is continuing to apply interventions learned in session into daily life situations. Pt is currently on track to meet goals utilizing interventions mentioned above. Personal growth and progress noted. Treatment to continue as indicated.   Plan: Return again in 4 weeks.  Diagnosis: Axis I: GAD    Axis II: No diagnosis    Tracey Lester Tracey Chernick, LCSW 10/07/2021

## 2021-10-14 ENCOUNTER — Ambulatory Visit: Payer: 59 | Admitting: Psychiatry

## 2021-10-14 ENCOUNTER — Other Ambulatory Visit: Payer: Self-pay

## 2021-10-21 ENCOUNTER — Other Ambulatory Visit: Payer: Self-pay

## 2021-10-21 ENCOUNTER — Ambulatory Visit (INDEPENDENT_AMBULATORY_CARE_PROVIDER_SITE_OTHER): Payer: 59 | Admitting: Licensed Clinical Social Worker

## 2021-10-21 DIAGNOSIS — F411 Generalized anxiety disorder: Secondary | ICD-10-CM | POA: Diagnosis not present

## 2021-10-21 NOTE — Progress Notes (Signed)
Virtual Visit via Video Note  I connected with Tracey Lester on 10/21/21 at 10:00 AM EST by a video enabled telemedicine application and verified that I am speaking with the correct person using two identifiers.  Location: Patient: home  Provider: remote office [employer], [place])   I discussed the limitations of evaluation and management by telemedicine and the availability of in person appointments. The patient expressed understanding and agreed to proceed.   I discussed the assessment and treatment plan with the patient. The patient was provided an opportunity to ask questions and all were answered. The patient agreed with the plan and demonstrated an understanding of the instructions.   The patient was advised to call back or seek an in-person evaluation if the symptoms worsen or if the condition fails to improve as anticipated.  I provided 60 minutes of non-face-to-face time during this encounter.   Tracey Lester R Tracey Podgurski, LCSW   THERAPIST PROGRESS NOTE  Session Time: 10-11a  Participation Level: Active  Behavioral Response: NeatAlertAnxious  Type of Therapy: Individual Therapy  Treatment Goals addressed:  Goal: LTG: Patient will score less than 5 on the Generalized Anxiety Disorder 7 Scale (GAD-7) Outcome: Progressing  Goal: STG: Patient will participate in at least 80% of scheduled individual psychotherapy sessions Outcome: Progressing  Goal: LTG: Reduce frequency, intensity, and duration of depression symptoms as evidenced by: pt self report Outcome: Progressing  Goal: STG: [name] @ will participate in at least 80% of scheduled individual psychotherapy sessions Outcome: Progressing  Interventions:  Intervention: Teach anxiety reduction techniques Intervention: Discuss self-management skills Intervention: Assess emotional status and coping mechanisms  Summary: Tracey Lester is a 50 y.o. female who presents with improving symptoms related to depression and  anxiety diagnosis. Pt reports that she is continuing to read self help books. Pt reports that overall mood is stable.  Allowed pt to explore and express thoughts and feelings associated with recent life situations and external stressors. Focused on cognitive distortions, importance of self care activities,  physically showing up for yoga, structuring daily activities, overeating and exploring psychological impact of lifelong trauma and events. Discussed codependency and evolution of "codependent" thoughts throughout timeline of marriage.   Continued recommendations are as follows: self care behaviors, positive social engagements, focusing on overall work/home/life balance, and focusing on positive physical and emotional wellness. .   Suicidal/Homicidal: No  Therapist Response: Pt is continuing to apply interventions learned in session into daily life situations. Pt is currently on track to meet goals utilizing interventions mentioned above. Personal growth and progress noted. Treatment to continue as indicated.   Plan: Return again in 4 weeks.  Diagnosis: Axis I: GAD    Axis II: No diagnosis    52 Tracey Hocutt, LCSW 10/21/2021

## 2021-10-21 NOTE — Plan of Care (Signed)
°  Problem: Decrease depressive symptoms and improve levels of effective functioning Goal: LTG: Reduce frequency, intensity, and duration of depression symptoms as evidenced by: pt self report Outcome: Progressing Goal: STG: [name] @ will participate in at least 80% of scheduled individual psychotherapy sessions Outcome: Progressing Intervention: Encourage effective communication techniques   Problem: Reduce overall frequency, intensity, and duration of the anxiety so that daily functioning is not impaired. Goal: LTG: Patient will score less than 5 on the Generalized Anxiety Disorder 7 Scale (GAD-7) Outcome: Progressing Goal: STG: Patient will participate in at least 80% of scheduled individual psychotherapy sessions Outcome: Progressing Intervention: Teach anxiety reduction techniques Intervention: Discuss self-management skills Intervention: Assess emotional status and coping mechanisms

## 2021-10-31 ENCOUNTER — Other Ambulatory Visit: Payer: Self-pay

## 2021-10-31 ENCOUNTER — Ambulatory Visit (INDEPENDENT_AMBULATORY_CARE_PROVIDER_SITE_OTHER): Payer: 59 | Admitting: Psychiatry

## 2021-10-31 ENCOUNTER — Encounter: Payer: Self-pay | Admitting: Psychiatry

## 2021-10-31 ENCOUNTER — Ambulatory Visit: Payer: 59 | Admitting: Psychiatry

## 2021-10-31 VITALS — BP 135/88 | HR 76 | Temp 97.8°F | Wt 160.2 lb

## 2021-10-31 DIAGNOSIS — F3342 Major depressive disorder, recurrent, in full remission: Secondary | ICD-10-CM

## 2021-10-31 DIAGNOSIS — F5105 Insomnia due to other mental disorder: Secondary | ICD-10-CM

## 2021-10-31 DIAGNOSIS — F908 Attention-deficit hyperactivity disorder, other type: Secondary | ICD-10-CM | POA: Diagnosis not present

## 2021-10-31 DIAGNOSIS — F411 Generalized anxiety disorder: Secondary | ICD-10-CM | POA: Diagnosis not present

## 2021-10-31 MED ORDER — AMPHETAMINE-DEXTROAMPHETAMINE 15 MG PO TABS: 15.0000 mg | ORAL_TABLET | Freq: Every day | ORAL | 0 refills | Status: DC

## 2021-10-31 MED ORDER — LAMOTRIGINE 100 MG PO TABS: 100.0000 mg | ORAL_TABLET | Freq: Every day | ORAL | 1 refills | Status: DC

## 2021-10-31 MED ORDER — AMPHETAMINE-DEXTROAMPHET ER 20 MG PO CP24: 20.0000 mg | ORAL_CAPSULE | Freq: Every day | ORAL | 0 refills | Status: DC

## 2021-10-31 MED ORDER — VALACYCLOVIR HCL 500 MG PO TABS: 500.0000 mg | ORAL_TABLET | Freq: Two times a day (BID) | ORAL | 0 refills | Status: DC

## 2021-10-31 NOTE — Progress Notes (Signed)
BH MD OP Progress Note  10/31/2021 2:12 PM Tracey Lester  MRN:  [id number]  Chief Complaint:  Chief Complaint   Follow-up; Anxiety; ADD    HPI: Tracey Lester is a 51 year old Caucasian female, married, lives in [place], has a history of GAD, MDD, ADHD was evaluated in office today.  Patient today reports overall she had a good holiday season.  She denies any significant mood lability, anxiety or depression symptoms.  She reports she has been journaling, practicing yoga, meditation that has helped significantly with her mood control.  Patient reports sleep continues to be good.  Does use the Ambien.  Reports concentration and focus is good on her current dosage of medication, Adderall.  Patient is compliant on her sertraline, Lamictal and her Adderall.  Denies side effects.  Denies suicidality, homicidality or perceptual disturbances.  Follows up with the therapist every 2 weeks, reports therapy sessions are beneficial.  Denies any other concerns today.  Visit Diagnosis:    ICD-10-CM   1. GAD (generalized anxiety disorder)  F41.1     2. MDD (major depressive disorder), recurrent, in full remission (HCC)  F33.42 lamoTRIgine (LAMICTAL) 100 MG tablet    3. Insomnia due to mental disorder  F51.05 amphetamine-dextroamphetamine (ADDERALL) 15 MG tablet    amphetamine-dextroamphetamine (ADDERALL XR) 20 MG 24 hr capsule   mood    4. Attention deficit hyperactivity disorder (ADHD), other type  F90.8 amphetamine-dextroamphetamine (ADDERALL) 15 MG tablet    amphetamine-dextroamphetamine (ADDERALL) 15 MG tablet    amphetamine-dextroamphetamine (ADDERALL XR) 20 MG 24 hr capsule    amphetamine-dextroamphetamine (ADDERALL XR) 20 MG 24 hr capsule      Past Psychiatric History: Reviewed past psychiatric history from progress note on 01/06/2018.  Past Medical History:  Past Medical History:  Diagnosis Date   ADHD (attention deficit hyperactivity disorder)    Anxiety    Depression     Herpes     Past Surgical History:  Procedure Laterality Date   TONSILLECTOMY      Family Psychiatric History: Reviewed family psychiatric history from progress note on 01/06/2018.  Family History:  Family History  Problem Relation Age of Onset   Anxiety disorder Mother    Depression Mother    Hypertension Mother    Anxiety disorder Father    Depression Father    Hypertension Father    Schizophrenia Paternal Grandmother    Depression Paternal Grandmother    Hearing loss Paternal Grandmother    Hypertension Paternal Grandmother    Depression Maternal Grandmother     Social History: Reviewed social history from progress note on 01/06/2018. Social History   Socioeconomic History   Marital status: Married    Spouse name: michael   Number of children: 3   Years of education: Not on file   Highest education level: Associate degree: occupational, Scientist, product/process development, or vocational program  Occupational History   Not on file  Tobacco Use   Smoking status: Former    Types: Cigarettes    Quit date: 10/08/2006    Years since quitting: 15.0   Smokeless tobacco: Never  Vaping Use   Vaping Use: Never used  Substance and Sexual Activity   Alcohol use: Yes    Alcohol/week: 5.0 standard drinks    Types: 4 Glasses of wine, 1 Cans of beer per week   Drug use: No   Sexual activity: Yes    Birth control/protection: None  Other Topics Concern   Not on file  Social History Narrative   Not on file  Social Determinants of Health   Financial Resource Strain: Not on file  Food Insecurity: Not on file  Transportation Needs: Not on file  Physical Activity: Not on file  Stress: Not on file  Social Connections: Not on file    Allergies:  Allergies  Allergen Reactions   Codeine Itching    Metabolic Disorder Labs: Lab Results  Component Value Date   HGBA1C 5.3 02/21/2021   MPG 105.41 02/21/2021   Lab Results  Component Value Date   PROLACTIN 19.6 02/21/2021   Lab Results   Component Value Date   CHOL 170 02/21/2021   TRIG 68 02/21/2021   HDL 69 02/21/2021   CHOLHDL 2.5 02/21/2021   VLDL 14 02/21/2021   LDLCALC 87 02/21/2021   Lab Results  Component Value Date   TSH 1.198 02/21/2021   TSH 2.59 08/09/2018    Therapeutic Level Labs: No results found for: LITHIUM No results found for: VALPROATE No components found for:  CBMZ  Current Medications: Current Outpatient Medications  Medication Sig Dispense Refill   clonazePAM (KLONOPIN) 0.5 MG tablet TAKE ONE TABLET BY MOUTH DAILY AS NEEDED ONLY FOR SEVERE PANIC ATTACKS 10 tablet 1   sertraline (ZOLOFT) 100 MG tablet TAKE TWO TABLETS BY MOUTH DAILY 180 tablet 1   zolpidem (AMBIEN) 10 MG tablet Take 1 tablet (10 mg total) by mouth at bedtime as needed for sleep. 30 tablet 3   amphetamine-dextroamphetamine (ADDERALL XR) 20 MG 24 hr capsule Take 1 capsule (20 mg total) by mouth daily. 30 capsule 0   [START ON 11/29/2021] amphetamine-dextroamphetamine (ADDERALL XR) 20 MG 24 hr capsule Take 1 capsule (20 mg total) by mouth daily. 30 capsule 0   [START ON 12/29/2021] amphetamine-dextroamphetamine (ADDERALL XR) 20 MG 24 hr capsule Take 1 capsule (20 mg total) by mouth daily. 30 capsule 0   amphetamine-dextroamphetamine (ADDERALL) 15 MG tablet Take 1 tablet by mouth daily. 30 tablet 0   [START ON 11/29/2021] amphetamine-dextroamphetamine (ADDERALL) 15 MG tablet Take 1 tablet by mouth daily. 30 tablet 0   [START ON 12/29/2021] amphetamine-dextroamphetamine (ADDERALL) 15 MG tablet Take 1 tablet by mouth daily. Daily at noon 30 tablet 0   dicyclomine (BENTYL) 10 MG capsule TAKE ONE CAPSULE BY MOUTH FOUR TIMES A DAY (BEFORE MEALS AND BEDTIME) (Patient not taking: Reported on 10/31/2021) 30 capsule 0   hydrOXYzine (VISTARIL) 50 MG capsule Take 1 capsule (50 mg total) by mouth at bedtime as needed. For sleep (Patient not taking: Reported on 10/31/2021) 30 capsule 1   [START ON 12/10/2021] lamoTRIgine (LAMICTAL) 100 MG tablet Take 1  tablet (100 mg total) by mouth daily. 90 tablet 1   loratadine (CLARITIN) 10 MG tablet Take 1 tablet (10 mg total) by mouth daily. (Patient not taking: Reported on 10/31/2021) 30 tablet 5   meclizine (ANTIVERT) 25 MG tablet Take 1 tablet (25 mg total) by mouth 3 (three) times daily as needed for dizziness. (Patient not taking: Reported on 10/31/2021) 30 tablet 1   valACYclovir (VALTREX) 500 MG tablet Take 1 tablet (500 mg total) by mouth 2 (two) times daily. 6 tablet 0   No current facility-administered medications for this visit.     Musculoskeletal: Strength & Muscle Tone: within normal limits Gait & Station: normal Patient leans: N/A  Psychiatric Specialty Exam: Review of Systems  Psychiatric/Behavioral:  Negative for agitation, behavioral problems, confusion, decreased concentration, dysphoric mood, hallucinations, self-injury, sleep disturbance and suicidal ideas. The patient is not nervous/anxious and is not hyperactive.   All other  systems reviewed and are negative.  Blood pressure 135/88, pulse 76, temperature 97.8 F (36.6 C), temperature source Temporal, weight 160 lb 3.2 oz (72.7 kg).Body mass index is 22.99 kg/m.  General Appearance: Casual  Eye Contact:  Fair  Speech:  Clear and Coherent  Volume:  Normal  Mood:  Euthymic  Affect:  Congruent  Thought Process:  Goal Directed and Descriptions of Associations: Intact  Orientation:  Full (Time, Place, and Person)  Thought Content: Logical   Suicidal Thoughts:  No  Homicidal Thoughts:  No  Memory:  Immediate;   Fair Recent;   Fair Remote;   Fair  Judgement:  Fair  Insight:  Fair  Psychomotor Activity:  Normal  Concentration:  Concentration: Fair and Attention Span: Fair  Recall:  Fis[employer]Fair  Fund of Knowledge: Fair  Language: Fair  Akathisia:  No  Handed:  Right  AIMS (if indicated): done,0  Assets:  Communication Skills Desire for Improvement Housing Resilience Social Support Talents/Skills Transportation  ADL's:   Intact  Cognition: WNL  Sleep:  Fair   Screenings: GAD-7    Flowsheet Row [facility] Visit [facility] Video Visit from [facility]/16/2021 [facility] Video Visit from [facility]/18/2021 [facility]  Total GAD-7 Score 1 1 3       PHQ2-9    Flowsheet Row [facility] Visit [facility] Counselor from [facility]/23/2021 [facility] Office Visit from [facility]/15/2021 [facility] Video Visit from [facility]16[facility] Video Visit from 03/18/2021 in [facility]  PHQ-2 Total Score 0 0 0 0 0  PHQ-9 Total Score 0 -- 6 0 --      Flowsheet Row Office Visit [facility] Counselor from 09/23/2021 in [facility] Counselor from 08[facility]18/2021 in [facility]  C-SSRS RISK CATEGORY No Risk No Risk No Risk        Assessment and Plan: Toma AranShirley Marcott is a [age] year old Caucasian female who has a history of MDD, insomnia, anxiety, ADD was evaluated in office today.  Patient is currently stable.  Plan as noted below.  Plan  MDD in full remission Lamotrigine 100 mg p.o. daily Zoloft 200 mg p.o. daily  GAD-stable Zoloft 200 mg p.o. daily Klonopin 0.5 mg as needed for severe panic attacks  Insomnia-stable Ambien 10 mg p.o. nightly as needed  ADHD-stable Adderall extended release 20 mg p.o. daily in the morning Adderall 15 mg p.o. daily after lunch We will provide 3 prescriptions-last 1 to be filled on or after 12/29/2021 Patient advised to monitor blood pressure since she is on stimulants.  We will continue to coordinate care with her therapist [name].  We will also provide one-time refill of her Valtrex 500 mg tablet for use as needed, since she is  currently in between providers.  Patient advised to establish care with a primary care provider/OB/GYN.  Follow-up in clinic in 3 months or sooner if needed.  This note was generated in part or whole with voice recognition software. Voice recognition is usually quite accurate but there are transcription errors that can and very often do occur. I apologize for any typographical errors that were not detected and corrected.     Jomarie LongsSaramma Ainara Eldridge, MD 10/31/2021, 2:12 PM

## 2021-11-04 ENCOUNTER — Other Ambulatory Visit: Payer: Self-pay

## 2021-11-04 ENCOUNTER — Ambulatory Visit (INDEPENDENT_AMBULATORY_CARE_PROVIDER_SITE_OTHER): Payer: 59 | Admitting: Licensed Clinical Social Worker

## 2021-11-04 DIAGNOSIS — F411 Generalized anxiety disorder: Secondary | ICD-10-CM

## 2021-11-04 NOTE — Progress Notes (Signed)
Virtual Visit via Video Note  I connected with [name] on 11/04/21 at 11:00 AM EST by a video enabled telemedicine application and verified that I am speaking with the correct person using two identifiers.  Video connection was lost when less than 50% of the duration of the visit was complete, at which time the remainder of the visit was completed via audio only.  Location: Patient: home  Provider: remote office [place], [place])   I discussed the limitations of evaluation and management by telemedicine and the availability of in person appointments. The patient expressed understanding and agreed to proceed.   I discussed the assessment and treatment plan with the patient. The patient was provided an opportunity to ask questions and all were answered. The patient agreed with the plan and demonstrated an understanding of the instructions.   The patient was advised to call back or seek an in-person evaluation if the symptoms worsen or if the condition fails to improve as anticipated.  I provided 60 minutes of non-face-to-face time during this encounter.   Tracey Lester R Tracey Barrows, LCSW   THERAPIST PROGRESS NOTE  Session Time: 11a-12p  Participation Level: Active  Behavioral Response: NeatAlertAnxious  Type of Therapy: Individual Therapy  Treatment Goals addressed:   Goal: LTG: Patient will score less than 5 on the Generalized Anxiety Disorder 7 Scale (GAD-7) Outcome: Progressing  Goal: STG: Patient will participate in at least 80% of scheduled individual psychotherapy sessions Outcome: Progressing  Goal: LTG: Reduce frequency, intensity, and duration of depression symptoms as evidenced by: pt self report Outcome: Progressing  Goal: STG: [name] @ will participate in at least 80% of scheduled individual psychotherapy sessions Outcome: Progressing  Interventions:   Intervention: Teach anxiety reduction techniques  Intervention: Discuss self-management  skills  Intervention: Assess emotional status and coping mechanisms  Summary: Tracey Lester is a 51 y.o. female who presents with improving symptoms related to depression and anxiety diagnosis. Pt reports that she is continuing to read self help books. Pt reports that overall mood is stable.  Allowed pt to explore and express thoughts and feelings associated with recent life situations and external stressors. Discussed overthinking and how to accept things as they are versus how they could be. Helped pt identify barriers in focus/concentration and developed some strategies to help improve focus/concentration when pt reading self help publications. Allowed pt safe space to explore feelings about medication management of symptoms. Pt feels sometimes that she is not her "authentic self" when taking the medication to manage symptoms. Explored why pt may be feeling this way and discussed negative stigmas associated with psychiatric medications. Explored times in life when pt has felt that she wanted to come off medication and just "see what happens". Pt reports that these experiences were very negative experiences for her and she doesn't know why she can't remember that whenever she wants to come off medication. Discussed pts use of Kratom--pt reports she has been taking it for over a year and is fearful that she may have a dependence. Allowed pt to weigh out pros and cons of taking substance not regulated by FDA--discussed risks versus benefits.  Reviewed healthy coping mechanisms for managing depression and anxiety symptoms. Pt reflects understanding.   Continued recommendations are as follows: self care behaviors, positive social engagements, focusing on overall work/home/life balance, and focusing on positive physical and emotional wellness. .   Suicidal/Homicidal: No  Therapist Response: Pt is continuing to apply interventions learned in session into daily life situations. Pt is currently on track to  meet goals utilizing interventions mentioned above. Personal growth and progress noted. Treatment to continue as indicated.   Plan: Return again in 4 weeks.  Diagnosis: Axis I: GAD    Axis II: No diagnosis    Ernest Haber Tracey Figiel, LCSW 11/04/2021

## 2021-11-04 NOTE — Plan of Care (Signed)
°  Problem: Decrease depressive symptoms and improve levels of effective functioning °Goal: LTG: Reduce frequency, intensity, and duration of depression symptoms as evidenced by: pt self report °Outcome: Progressing °Goal: STG: [name]@ will participate in at least 80% of scheduled individual psychotherapy sessions °Outcome: Progressing °  °Problem: Reduce overall frequency, intensity, and duration of the anxiety so that daily functioning is not impaired. °Goal: LTG: Patient will score less than 5 on the Generalized Anxiety Disorder 7 Scale (GAD-7) °Outcome: Progressing °Goal: STG: Patient will participate in at least 80% of scheduled individual psychotherapy sessions °Outcome: Progressing °  °

## 2021-11-12 ENCOUNTER — Encounter: Payer: Self-pay | Admitting: Licensed Clinical Social Worker

## 2021-11-18 ENCOUNTER — Ambulatory Visit (INDEPENDENT_AMBULATORY_CARE_PROVIDER_SITE_OTHER): Payer: 59 | Admitting: Licensed Clinical Social Worker

## 2021-11-18 ENCOUNTER — Other Ambulatory Visit: Payer: Self-pay

## 2021-11-18 DIAGNOSIS — F411 Generalized anxiety disorder: Secondary | ICD-10-CM | POA: Diagnosis not present

## 2021-11-18 NOTE — Plan of Care (Signed)
°  Problem: Decrease depressive symptoms and improve levels of effective functioning Goal: LTG: Reduce frequency, intensity, and duration of depression symptoms as evidenced by: pt self report Outcome: Progressing Goal: STG: [name] @ will participate in at least 80% of scheduled individual psychotherapy sessions Outcome: Progressing   Problem: Reduce overall frequency, intensity, and duration of the anxiety so that daily functioning is not impaired. Goal: LTG: Patient will score less than 5 on the Generalized Anxiety Disorder 7 Scale (GAD-7) Outcome: Progressing Goal: STG: Patient will participate in at least 80% of scheduled individual psychotherapy sessions Outcome: Progressing

## 2021-11-18 NOTE — Progress Notes (Signed)
Virtual Visit via Video Note  I connected with Tracey Lester on 11/18/21 at 11:00 AM EST by a video enabled telemedicine application and verified that I am speaking with the correct person using two identifiers.  Video connection was lost when less than 50% of the duration of the visit was complete, at which time the remainder of the visit was completed via audio only.  Location: Patient: home  Provider: remote office [place], [place])   I discussed the limitations of evaluation and management by telemedicine and the availability of in person appointments. The patient expressed understanding and agreed to proceed.   I discussed the assessment and treatment plan with the patient. The patient was provided an opportunity to ask questions and all were answered. The patient agreed with the plan and demonstrated an understanding of the instructions.   The patient was advised to call back or seek an in-person evaluation if the symptoms worsen or if the condition fails to improve as anticipated.  I provided 60 minutes of non-face-to-face time during this encounter.   Tracey Lester R Tracey Breed, LCSW   THERAPIST PROGRESS NOTE  Session Time: 11a-12p  Participation Level: Active  Behavioral Response: NeatAlertAnxious  Type of Therapy: Individual Therapy  Treatment Goals addressed:   Goal: LTG: Patient will score less than 5 on the Generalized Anxiety Disorder 7 Scale (GAD-7) Outcome: Progressing  Goal: STG: Patient will participate in at least 80% of scheduled individual psychotherapy sessions Outcome: Progressing  Goal: LTG: Reduce frequency, intensity, and duration of depression symptoms as evidenced by: pt self report Outcome: Progressing  Goal: STG: [name] @ will participate in at least 80% of scheduled individual psychotherapy sessions Outcome: Progressing  Interventions:   Intervention: Teach anxiety reduction techniques  Intervention: Discuss self-management  skills  Intervention: Assess emotional status and coping mechanisms  Summary: Tracey Lester is a 51 y.o. female who presents with improving symptoms related to depression and anxiety diagnosis.  Pt reports that overall mood is stable. Pt reports good quality and quantity of sleep.   Allowed pt to explore and express thoughts and feelings associated with recent life situations and external stressors. Discussed pts current use of Kratom and developed plan for reduction. Pt committed to reducing amount and is fearful of withdrawal symptoms. Pt reports that she had a conversation with chiropractor--then decided that she wanted to stop taking it as much. Discussed CBT based strategies for helping manage cravings and actual decrease in amount of substance used at each session and reduction in sessions per week. Pt excited about new yoga program she is starting: commitment to daily yoga practice along with clean eating. Encouraged pt to continue with this if it makes her feel better about herself.   Continued recommendations are as follows: self care behaviors, positive social engagements, focusing on overall work/home/life balance, and focusing on positive physical and emotional wellness. .   Suicidal/Homicidal: No  Therapist Response: Pt is continuing to apply interventions learned in session into daily life situations. Pt is currently on track to meet goals utilizing interventions mentioned above. Personal growth and progress noted. Treatment to continue as indicated.   Plan: Return again in 2 weeks.  Diagnosis: Axis I: GAD    Axis II: No diagnosis    [address] Tracey Kentner, LCSW 11/18/2021

## 2021-12-01 ENCOUNTER — Ambulatory Visit (INDEPENDENT_AMBULATORY_CARE_PROVIDER_SITE_OTHER): Payer: 59 | Admitting: Licensed Clinical Social Worker

## 2021-12-01 ENCOUNTER — Other Ambulatory Visit: Payer: Self-pay | Admitting: Psychiatry

## 2021-12-01 ENCOUNTER — Other Ambulatory Visit: Payer: Self-pay

## 2021-12-01 DIAGNOSIS — F411 Generalized anxiety disorder: Secondary | ICD-10-CM

## 2021-12-01 NOTE — Progress Notes (Signed)
Virtual Visit via Video Note  I connected with [name] on 12/01/21 at  2:00 PM EST by a video enabled telemedicine application and verified that I am speaking with the correct person using two identifiers.  Video connection was lost when less than 50% of the duration of the visit was complete, at which time the remainder of the visit was completed via audio only.  Location: Patient: home  Provider: remote office [place], [place])   I discussed the limitations of evaluation and management by telemedicine and the availability of in person appointments. The patient expressed understanding and agreed to proceed.   I discussed the assessment and treatment plan with the patient. The patient was provided an opportunity to ask questions and all were answered. The patient agreed with the plan and demonstrated an understanding of the instructions.   The patient was advised to call back or seek an in-person evaluation if the symptoms worsen or if the condition fails to improve as anticipated.  I provided 45 minutes of non-face-to-face time during this encounter.   Wolf Boulay R Patt Steinhardt, LCSW   THERAPIST PROGRESS NOTE  Session Time: 2-245  Participation Level: Active  Behavioral Response: NeatAlertAnxious  Type of Therapy: Individual Therapy  Treatment Goals addressed:   Goal: LTG: Patient will score less than 5 on the Generalized Anxiety Disorder 7 Scale (GAD-7) Outcome: Progressing  Goal: STG: Patient will participate in at least 80% of scheduled individual psychotherapy sessions Outcome: Progressing  Goal: LTG: Reduce frequency, intensity, and duration of depression symptoms as evidenced by: pt self report Outcome: Progressing  Goal: STG: [name] @ will participate in at least 80% of scheduled individual psychotherapy sessions Outcome: Progressing  Interventions:   Intervention: Teach anxiety reduction techniques  Intervention: Discuss self-management  skills  Intervention: Assess emotional status and coping mechanisms  Intervention: Motivational interviewing for assessment of substance use  Intervention: Behavior modification of substance use  Summary: Alvine Mostafa is a 51 y.o. female who presents with improving symptoms related to depression and anxiety diagnosis.  Pt reports that overall mood is stable. Pt reports good quality and quantity of sleep.   Allowed pt to explore and express thoughts and feelings associated with recent life situations and external stressors. Discussed upcoming trip to [place]   Yoga, meditation, journaling. Discussed whether pt is truly reaching points of peace with these activities. Pt states she is continuing with her practice, yet rating anxiety "8" on scale of 1-10 with 10 being the worst.   Discussed substance use/withdrawal. Pt having a tough time "giving up" the kratom. Discussed behavior replacement (drinking tea instead), reviewed anxiety management. Allowed pt to fully express herself with unconditional acceptance and support.   Continued recommendations are as follows: self care behaviors, positive social engagements, focusing on overall work/home/life balance, and focusing on positive physical and emotional wellness. .   Suicidal/Homicidal: No  Therapist Response: Pt is continuing to apply interventions learned in session into daily life situations. Pt is currently on track to meet goals utilizing interventions mentioned above. Personal growth and progress noted. Treatment to continue as indicated.   Plan: Return again in 2 weeks.  Diagnosis: Axis I: GAD    Axis II: No diagnosis    [place] Harlene Petralia, LCSW 12/01/2021

## 2021-12-01 NOTE — Plan of Care (Signed)
°  Problem: Decrease depressive symptoms and improve levels of effective functioning Goal: LTG: Reduce frequency, intensity, and duration of depression symptoms as evidenced by: pt self report Outcome: Progressing Goal: STG: [name] @ will participate in at least 80% of scheduled individual psychotherapy sessions Outcome: Progressing   Problem: Reduce overall frequency, intensity, and duration of the anxiety so that daily functioning is not impaired. Goal: LTG: Patient will score less than 5 on the Generalized Anxiety Disorder 7 Scale (GAD-7) Outcome: Progressing Goal: STG: Patient will participate in at least 80% of scheduled individual psychotherapy sessions Outcome: Progressing   Problem: Substance Use Disorder CCP Problem  1 Accept the fact of chemical dependence and begin to actively participate and utilize behavioral strategies and cognitive coping skills to help maintain sobriety 3 out of 5 sessions documented Goal: LTG: Tracey Lester WILL INCREASE COPING SKILLS TO PROMOTE LONG-TERM RECOVERY AND IMPROVE ABILITY TO PERFORM DAILY ACTIVITIES Outcome: Progressing Goal: STG: Promote adherence to treatment regimen Outcome: Progressing Intervention: Review triggers associated with substance abuse

## 2021-12-08 ENCOUNTER — Ambulatory Visit: Payer: 59 | Admitting: Licensed Clinical Social Worker

## 2021-12-16 ENCOUNTER — Ambulatory Visit (INDEPENDENT_AMBULATORY_CARE_PROVIDER_SITE_OTHER): Payer: 59 | Admitting: Licensed Clinical Social Worker

## 2021-12-16 ENCOUNTER — Other Ambulatory Visit: Payer: Self-pay

## 2021-12-16 ENCOUNTER — Telehealth: Payer: Self-pay | Admitting: Licensed Clinical Social Worker

## 2021-12-16 DIAGNOSIS — Z91198 Patient's noncompliance with other medical treatment and regimen for other reason: Secondary | ICD-10-CM

## 2021-12-16 NOTE — Telephone Encounter (Signed)
LCSW counselor tried to connect with patient for scheduled appointment via MyChart video text request x 2 and email request; also tried to connect via phone without success. LCSW counselor left message for patient to call office number to reschedule OPT appointment.   Attempt 1: Text and email: 8:03am  Attempt 2: Text and email: 8:09am  Attempt 3: phone call:  8:15am--left message voice mail

## 2021-12-16 NOTE — Progress Notes (Addendum)
Pt called office to cancel appt

## 2021-12-16 NOTE — Addendum Note (Signed)
Addended by: Granville Lewis on: 12/16/2021 08:27 AM   Modules accepted: Level of Service

## 2021-12-22 ENCOUNTER — Ambulatory Visit (INDEPENDENT_AMBULATORY_CARE_PROVIDER_SITE_OTHER): Payer: 59 | Admitting: Licensed Clinical Social Worker

## 2021-12-22 ENCOUNTER — Other Ambulatory Visit: Payer: Self-pay

## 2021-12-22 DIAGNOSIS — F411 Generalized anxiety disorder: Secondary | ICD-10-CM

## 2021-12-22 NOTE — Plan of Care (Signed)
°  Problem: Decrease depressive symptoms and improve levels of effective functioning Goal: LTG: Reduce frequency, intensity, and duration of depression symptoms as evidenced by: pt self report Outcome: Not Progressing Note: Pt reports increase in depression symptoms since last session Goal: STG: [name] @ will participate in at least 80% of scheduled individual psychotherapy sessions Outcome: Progressing Intervention: REVIEW PLEASE SKILLS (TREAT PHYSICAL ILLNESS, BALANCE EATING, AVOID MOOD-ALTERING SUBSTANCES, BALANCE SLEEP AND GET EXERCISE) WITH Aja   Problem: Reduce overall frequency, intensity, and duration of the anxiety so that daily functioning is not impaired. Goal: LTG: Patient will score less than 5 on the Generalized Anxiety Disorder 7 Scale (GAD-7) Outcome: Progressing Goal: STG: Patient will participate in at least 80% of scheduled individual psychotherapy sessions Outcome: Progressing Intervention: Assist with relaxation techniques, as appropriate (deep breathing exercises, meditation, guided imagery) Intervention: Manage signs of labile or escalating emotions Intervention: Encourage self-care activities Intervention: Continue cognitive behavioral therapy for self esteem/thinking about self

## 2021-12-22 NOTE — Progress Notes (Signed)
Virtual Visit via Video Note  I connected with [name] on 12/22/21 at 10:00 AM EST by a video enabled telemedicine application and verified that I am speaking with the correct person using two identifiers.  Location: Patient: home Provider: remote office [place], [place])   I discussed the limitations of evaluation and management by telemedicine and the availability of in person appointments. The patient expressed understanding and agreed to proceed.   I discussed the assessment and treatment plan with the patient. The patient was provided an opportunity to ask questions and all were answered. The patient agreed with the plan and demonstrated an understanding of the instructions.   The patient was advised to call back or seek an in-person evaluation if the symptoms worsen or if the condition fails to improve as anticipated.  I provided 60 minutes of non-face-to-face time during this encounter.   Jamariah Tony R Deuce Paternoster, LCSW   THERAPIST PROGRESS NOTE  Session Time: 10-11a  Participation Level: Active  Behavioral Response: CasualAlertAnxious and Depressed  Type of Therapy: Individual Therapy  Treatment Goals addressed:Problem: Reduce overall frequency, intensity, and duration of the anxiety so that daily functioning is not impaired. Goal: LTG: Patient will score less than 5 on the Generalized Anxiety Disorder 7 Scale (GAD-7) Outcome: Progressing Goal: LTG: Reduce frequency, intensity, and duration of depression symptoms as evidenced by: pt self report Outcome: Not Progressing Note: Pt reports increase in depression symptoms since last session  ProgressTowards Goals: Progressing  Interventions: CBT and DBT  Summary: Xzandria Mayerhofer is a 51 y.o. female who presents with increase in depression symptoms since last session. Pt reports overall she feels like she is managing symptoms well and is trying  very hard. Pt reports that depression triggered by her relationship situation.    Allowed pt to explore and express thoughts and feelings associated with recent life situations and external stressors. Explored pts relationship at length and overall psychological impact of relationship. Discussed importance of self care and repairing self versus trying to repair partner.   Continued recommendations are as follows: self care behaviors, positive social engagements, focusing on overall work/home/life balance, and focusing on positive physical and emotional wellness. .   Suicidal/Homicidal: No  Therapist Response: Pt is continuing to apply interventions learned in session into daily life situations. Pt is currently on track to meet goals utilizing interventions mentioned above. Personal growth and progress noted. Treatment to continue as indicated.   Plan: Return again in 4 weeks.  Diagnosis: GAD (generalized anxiety disorder)  Collaboration of Care: Other Pt encouraged to continue services with psychiatrist, Dr. Shea Evans  Patient/Guardian was advised Release of Information must be obtained prior to any record release in order to collaborate their care with an outside provider. Patient/Guardian was advised if they have not already done so to contact the registration department to sign all necessary forms in order for [place] to release information regarding their care.   Consent: Patient/Guardian gives verbal consent for treatment and assignment of benefits for services provided during this visit. Patient/Guardian expressed understanding and agreed to proceed.   [place], LCSW 12/22/2021

## 2021-12-29 ENCOUNTER — Telehealth: Payer: Self-pay

## 2021-12-29 DIAGNOSIS — F5105 Insomnia due to other mental disorder: Secondary | ICD-10-CM

## 2021-12-29 MED ORDER — AMPHETAMINE-DEXTROAMPHETAMINE 15 MG PO TABS: 15.0000 mg | ORAL_TABLET | Freq: Every day | ORAL | 0 refills | Status: DC

## 2021-12-29 NOTE — Telephone Encounter (Signed)
I have sent Adderall 15 mg to pharmacy at [facility], [place]. ? ?I have also contacted [facility] in [place] to cancel the Adderall 15 mg which was sent to them previously. ?

## 2021-12-29 NOTE — Telephone Encounter (Signed)
pt states that the [facility] here does not have the adderall she asked if you could send a rx to the [facility] in [place], ?

## 2021-12-30 ENCOUNTER — Telehealth: Payer: Self-pay

## 2021-12-30 DIAGNOSIS — F5105 Insomnia due to other mental disorder: Secondary | ICD-10-CM

## 2021-12-30 MED ORDER — AMPHETAMINE-DEXTROAMPHET ER 20 MG PO CP24: 20.0000 mg | ORAL_CAPSULE | Freq: Every day | ORAL | 0 refills | Status: DC

## 2021-12-30 NOTE — Telephone Encounter (Signed)
pt called she states that [facility] did not have by the time she got there. she [facility]  she also wanted to know if it was something else she can take she doesn't want to have to go threw this every time.  ?

## 2021-12-30 NOTE — Telephone Encounter (Signed)
Contacted patient, advised her to contact [facility] to make sure the supplies are still there for the Adderall. ? ?Patient was able to contact [facility] again and they told her that they only had the Adderall extended release 20 mg and not to 15 mg immediate release. ? ?Writer contacted patient back to get this information and sent Adderall 20 mg extended release to [facility] as noted in [place]. ? ?Will not discontinue the Adderall 15 mg which was sent to [facility] in [place] yesterday. ? ?Patient wonders whether she can get a 90-day supply for the 20 mg XR.  She will discuss with pharmacist and will let writer know. ?

## 2022-01-05 ENCOUNTER — Other Ambulatory Visit: Payer: Self-pay

## 2022-01-05 ENCOUNTER — Ambulatory Visit (INDEPENDENT_AMBULATORY_CARE_PROVIDER_SITE_OTHER): Payer: 59 | Admitting: Licensed Clinical Social Worker

## 2022-01-05 DIAGNOSIS — F411 Generalized anxiety disorder: Secondary | ICD-10-CM

## 2022-01-05 NOTE — Plan of Care (Signed)
?  Problem: Reduce overall frequency, intensity, and duration of the anxiety so that daily functioning is not impaired. ?Goal: LTG: Patient will score less than 5 on the Generalized Anxiety Disorder 7 Scale (GAD-7) ?Outcome: Progressing ?Goal: STG: Patient will participate in at least 80% of scheduled individual psychotherapy sessions ?Outcome: Progressing ?Intervention: Continue cognitive behavioral therapy for self esteem/thinking about self ?Intervention: Encourage patient to identify triggers ?  ?Problem: Decrease depressive symptoms and improve levels of effective functioning ?Goal: LTG: Reduce frequency, intensity, and duration of depression symptoms as evidenced by: pt self report ?Outcome: Progressing ?Goal: STG: [name] @ will participate in at least 80% of scheduled individual psychotherapy sessions ?Outcome: Progressing ?Intervention: Encourage self-care activities ?Intervention: Encourage verbalization of feelings/concerns/expectations ?Intervention: Encourage verbalization of feelings/concerns/expectations ?  ?

## 2022-01-05 NOTE — Progress Notes (Signed)
Virtual Visit via Video Note ? ?I connected with Tracey Lester on 01/05/22 at 10:00 AM EDT by a video enabled telemedicine application and verified that I am speaking with the correct person using two identifiers. ? ?Location: ?Patient: home ?Provider: remote office [place], [place]) ?  ?I discussed the limitations of evaluation and management by telemedicine and the availability of in person appointments. The patient expressed understanding and agreed to proceed. ?  ?I discussed the assessment and treatment plan with the patient. The patient was provided an opportunity to ask questions and all were answered. The patient agreed with the plan and demonstrated an understanding of the instructions. ?  ?The patient was advised to call back or seek an in-person evaluation if the symptoms worsen or if the condition fails to improve as anticipated. ? ?I provided  60 minutes of non-face-to-face time during this encounter. ? ? ?Maralyn Witherell R Alaiza Yau, LCSW ? ? ?THERAPIST PROGRESS NOTE ? ?Session Time: 10-11a ? ?Participation Level: Active ? ?Behavioral Response: CasualAlertAnxious and Depressed ? ?Type of Therapy: Individual Therapy ? ?Treatment Goals addressed: ?Problem: Reduce overall frequency, intensity, and duration of the anxiety so that daily functioning is not impaired. ?Goal: LTG: Patient will score less than 5 on the Generalized Anxiety Disorder 7 Scale (GAD-7) ?Outcome: Progressing ?Goal: STG: Patient will participate in at least 80% of scheduled individual psychotherapy sessions ?Outcome: Progressing ?Problem: Decrease depressive symptoms and improve levels of effective functioning ?Goal: LTG: Reduce frequency, intensity, and duration of depression symptoms as evidenced by: pt self report ?Outcome: Progressing ?Goal: STG: [name] @ will participate in at least 80% of scheduled individual psychotherapy sessions ?Outcome: Progressing ? ?ProgressTowards Goals: Progressing ? ?Interventions: CBT and  DBT ?Intervention: Encourage self-care activities ?Intervention: Encourage verbalization of feelings/concerns/expectations ?Intervention: Continue cognitive behavioral therapy for self esteem/thinking about self ?Intervention: Encourage patient to identify triggers ?Intervention: motivational interviewing re: substance use ? ?Summary: Tracey Lester is a 51 y.o. female who presents with increase in depression symptoms since last session. Pt reports overall she feels like she is managing symptoms well and is trying  very hard.  ? ?Allowed pt to explore and express thoughts and feelings associated with recent life situations and external stressors. Pt reports that she is currently having some physical challenges that are triggering stress and anxiety. Reviewed coping strategies for managing stress and anxiety. Discussed psychological impact of addiction. Discussed cognitive restructuring re: healthy and unhealthy ways of coping. ? ?Explored pts current philosophical questions about life and spiritual past/present. Allowed pt to freely think and speak about past beliefs versus present beliefs.  ? ?Reviewed behavior management re: kratom use. Pt reports that she has decreased substance use 300%--only using one time at night now and pt is okay with this. Pt admits that she no longer has cravings anymore.  ? ?Continued recommendations are as follows: self care behaviors, positive social engagements, focusing on overall work/home/life balance, and focusing on positive physical and emotional wellness. .  ? ?Suicidal/Homicidal: No ? ?Therapist Response: Pt is continuing to apply interventions learned in session into daily life situations. Pt is currently on track to meet goals utilizing interventions mentioned above. Personal growth and progress noted. Treatment to continue as indicated.  ? ?Plan: Return again in 4 weeks. ? ?Encounter Diagnosis  ?Name Primary?  ? GAD (generalized anxiety disorder) Yes  ? ? ?Collaboration of  Care: Other Pt encouraged to continue services with psychiatrist, Dr. 52 ? ?Patient/Guardian was advised Release of Information must be obtained prior to any record release in order  to collaborate their care with an outside provider. Patient/Guardian was advised if they have not already done so to contact the registration department to sign all necessary forms in order for [place] to release information regarding their care.  ? ?Consent: Patient/Guardian gives verbal consent for treatment and assignment of benefits for services provided during this visit. Patient/Guardian expressed understanding and agreed to proceed.  ? ?Jackeline Gutknecht R Ivey Nembhard, LCSW ?01/05/2022 ? ?

## 2022-01-05 NOTE — Plan of Care (Signed)
?  Problem: Decrease depressive symptoms and improve levels of effective functioning ?Intervention: Encourage self-care activities ?Intervention: Encourage verbalization of feelings/concerns/expectations ?Intervention: Encourage verbalization of feelings/concerns/expectations ?  ?Problem: Reduce overall frequency, intensity, and duration of the anxiety so that daily functioning is not impaired. ?Intervention: Continue cognitive behavioral therapy for self esteem/thinking about self ?Intervention: Encourage patient to identify triggers ?  ?

## 2022-01-12 ENCOUNTER — Other Ambulatory Visit: Payer: Self-pay | Admitting: Psychiatry

## 2022-01-12 DIAGNOSIS — F5105 Insomnia due to other mental disorder: Secondary | ICD-10-CM

## 2022-01-14 ENCOUNTER — Ambulatory Visit: Payer: 59 | Admitting: Licensed Clinical Social Worker

## 2022-01-14 ENCOUNTER — Telehealth (INDEPENDENT_AMBULATORY_CARE_PROVIDER_SITE_OTHER): Payer: 59 | Admitting: Licensed Clinical Social Worker

## 2022-01-14 ENCOUNTER — Other Ambulatory Visit: Payer: Self-pay

## 2022-01-14 DIAGNOSIS — Z91198 Patient's noncompliance with other medical treatment and regimen for other reason: Secondary | ICD-10-CM

## 2022-01-14 NOTE — Telephone Encounter (Signed)
Called pt to see if today's scheduled appt (earlier than noted in previous session) was an appointment or one that was forgotten to cancel.  Pt confirmed that she did not have today in her calendar and was on her way to work. ? ?Encouraged pt to keep next appt and that this appt will be cancelled with no penalty to pt.  ?

## 2022-01-14 NOTE — Progress Notes (Signed)
Cancel appt--pt and clinician forgot to cancel this appt--pt has appt in April.  Called pt on phone to confirm. ?

## 2022-01-20 ENCOUNTER — Encounter: Payer: Self-pay | Admitting: Psychiatry

## 2022-01-20 ENCOUNTER — Other Ambulatory Visit: Payer: Self-pay

## 2022-01-20 ENCOUNTER — Telehealth (INDEPENDENT_AMBULATORY_CARE_PROVIDER_SITE_OTHER): Payer: 59 | Admitting: Psychiatry

## 2022-01-20 DIAGNOSIS — F3342 Major depressive disorder, recurrent, in full remission: Secondary | ICD-10-CM | POA: Diagnosis not present

## 2022-01-20 DIAGNOSIS — F411 Generalized anxiety disorder: Secondary | ICD-10-CM | POA: Diagnosis not present

## 2022-01-20 DIAGNOSIS — F5105 Insomnia due to other mental disorder: Secondary | ICD-10-CM | POA: Diagnosis not present

## 2022-01-20 DIAGNOSIS — F908 Attention-deficit hyperactivity disorder, other type: Secondary | ICD-10-CM

## 2022-01-20 MED ORDER — SERTRALINE HCL 100 MG PO TABS: 200.0000 mg | ORAL_TABLET | Freq: Every day | ORAL | 1 refills | Status: DC

## 2022-01-20 MED ORDER — AMPHETAMINE-DEXTROAMPHETAMINE 15 MG PO TABS: 15.0000 mg | ORAL_TABLET | Freq: Every day | ORAL | 0 refills | Status: DC

## 2022-01-20 MED ORDER — AMPHETAMINE-DEXTROAMPHET ER 20 MG PO CP24: 20.0000 mg | ORAL_CAPSULE | Freq: Every day | ORAL | 0 refills | Status: DC

## 2022-01-20 NOTE — Progress Notes (Signed)
Virtual Visit via Video Note ? ?I connected with Tracey Lester on 01/20/22 at  1:00 PM EDT by a video enabled telemedicine application and verified that I am speaking with the correct person using two identifiers. ? ?Location ?Provider Location : [facility] ?Patient Location : Home ? ?Participants: Patient , Provider ? ?  ?I discussed the limitations of evaluation and management by telemedicine and the availability of in person appointments. The patient expressed understanding and agreed to proceed. ? ?  ?I discussed the assessment and treatment plan with the patient. The patient was provided an opportunity to ask questions and all were answered. The patient agreed with the plan and demonstrated an understanding of the instructions. ?  ?The patient was advised to call back or seek an in-person evaluation if the symptoms worsen or if the condition fails to improve as anticipated. ? ? ?BH MD OP Progress Note ? ?01/20/2022 5:34 PM ?Tracey Lester  ?MRN:  [id number] ? ?Chief Complaint:  ?Chief Complaint  ?Patient presents with  ? Follow-up :51 year old Caucasian female with history of MDD, GAD, ADHD was evaluated for medication management.  ? ?HPI: Tracey Lester is a 51 year old Caucasian female, married, lives in [place], has a history of GAD, MDD, ADHD was evaluated by telemedicine today. ? ?Patient today reports overall mood wise she is currently doing well.  Denies any significant sadness, low motivation, anxiety, concentration problems. ? ?Reports attention and focus as good on the current dosage of Adderall.  Denies side effects. ? ?Reports sleep is overall good. ? ?Patient continues to participate in yoga/meditation and reports she has been working on certain goals.  Reports she continues to work with her therapist and that has been beneficial. ? ?Denies suicidality, homicidality or perceptual disturbances. ? ?Currently denies any substance abuse problems. ? ?Denies any other concerns today. ? ?Visit Diagnosis:  ?   ICD-10-CM   ?1. GAD (generalized anxiety disorder)  F41.1   ?  ?2. MDD (major depressive disorder), recurrent, in full remission (HCC)  F33.42 sertraline (ZOLOFT) 100 MG tablet  ?  ?3. Insomnia due to mental disorder  F51.05   ? mood, adhd  ?  ?4. Attention deficit hyperactivity disorder (ADHD), other type  F90.8 amphetamine-dextroamphetamine (ADDERALL) 15 MG tablet  ?  amphetamine-dextroamphetamine (ADDERALL XR) 20 MG 24 hr capsule  ?  ? ? ?Past Psychiatric History: Reviewed past psychiatric history from progress note on 01/06/2018. ? ?Past Medical History:  ?Past Medical History:  ?Diagnosis Date  ? ADHD (attention deficit hyperactivity disorder)   ? Anxiety   ? Depression   ? Herpes   ?  ?Past Surgical History:  ?Procedure Laterality Date  ? TONSILLECTOMY    ? ? ?Family Psychiatric History: Reviewed family psychiatric history from progress note on 01/06/2018. ? ?Family History:  ?Family History  ?Problem Relation Age of Onset  ? Anxiety disorder Mother   ? Depression Mother   ? Hypertension Mother   ? Anxiety disorder Father   ? Depression Father   ? Hypertension Father   ? Schizophrenia Paternal Grandmother   ? Depression Paternal Grandmother   ? Hearing loss Paternal Grandmother   ? Hypertension Paternal Grandmother   ? Depression Maternal Grandmother   ? ? ?Social History: Reviewed social history from progress note on 01/06/2018. ?Social History  ? ?Socioeconomic History  ? Marital status: Married  ?  Spouse name: michael  ? Number of children: 3  ? Years of education: Not on file  ? Highest education level: Associate degree: occupational, technical,  or vocational program  ?Occupational History  ? Not on file  ?Tobacco Use  ? Smoking status: Former  ?  Types: Cigarettes  ?  Quit date: 10/08/2006  ?  Years since quitting: 15.2  ? Smokeless tobacco: Never  ?Vaping Use  ? Vaping Use: Never used  ?Substance and Sexual Activity  ? Alcohol use: Yes  ?  Alcohol/week: 5.0 standard drinks  ?  Types: 4 Glasses of wine, 1  Cans of beer per week  ? Drug use: No  ? Sexual activity: Yes  ?  Birth control/protection: None  ?Other Topics Concern  ? Not on file  ?Social History Narrative  ? Not on file  ? ?Social Determinants of Health  ? ?Financial Resource Strain: Not on file  ?Food Insecurity: Not on file  ?Transportation Needs: Not on file  ?Physical Activity: Not on file  ?Stress: Not on file  ?Social Connections: Not on file  ? ? ?Allergies:  ?Allergies  ?Allergen Reactions  ? Codeine Itching  ? ? ?Metabolic Disorder Labs: ?Lab Results  ?Component Value Date  ? HGBA1C 5.3 02/21/2021  ? MPG 105.41 02/21/2021  ? ?Lab Results  ?Component Value Date  ? PROLACTIN 19.6 02/21/2021  ? ?Lab Results  ?Component Value Date  ? CHOL 170 02/21/2021  ? TRIG 68 02/21/2021  ? HDL 69 02/21/2021  ? CHOLHDL 2.5 02/21/2021  ? VLDL 14 02/21/2021  ? LDLCALC 87 02/21/2021  ? ?Lab Results  ?Component Value Date  ? TSH 1.198 02/21/2021  ? TSH 2.59 08/09/2018  ? ? ?Therapeutic Level Labs: ?No results found for: LITHIUM ?No results found for: VALPROATE ?No components found for:  CBMZ ? ?Current Medications: ?Current Outpatient Medications  ?Medication Sig Dispense Refill  ? amphetamine-dextroamphetamine (ADDERALL XR) 20 MG 24 hr capsule Take 1 capsule (20 mg total) by mouth daily. 30 capsule 0  ? amphetamine-dextroamphetamine (ADDERALL XR) 20 MG 24 hr capsule Take 1 capsule (20 mg total) by mouth daily. 30 capsule 0  ? amphetamine-dextroamphetamine (ADDERALL) 15 MG tablet Take 1 tablet by mouth daily. 30 tablet 0  ? amphetamine-dextroamphetamine (ADDERALL) 15 MG tablet Take 1 tablet by mouth daily. Daily at noon 30 tablet 0  ? lamoTRIgine (LAMICTAL) 100 MG tablet Take 1 tablet (100 mg total) by mouth daily. 90 tablet 1  ? valACYclovir (VALTREX) 500 MG tablet Take 1 tablet (500 mg total) by mouth 2 (two) times daily. 6 tablet 0  ? zolpidem (AMBIEN) 10 MG tablet TAKE ONE TABLET BY MOUTH EVERY NIGHT AT BEDTIME AS NEEDED FOR SLEEP DNFB 07/21/21 30 tablet 1  ? [START  ON 01/28/2022] amphetamine-dextroamphetamine (ADDERALL XR) 20 MG 24 hr capsule Take 1 capsule (20 mg total) by mouth daily. 30 capsule 0  ? [START ON 02/07/2022] amphetamine-dextroamphetamine (ADDERALL) 15 MG tablet Take 1 tablet by mouth daily. 30 tablet 0  ? clonazePAM (KLONOPIN) 0.5 MG tablet TAKE ONE TABLET BY MOUTH DAILY AS NEEDED ONLY FOR SEVERE PANIC ATTACKS (Patient not taking: Reported on 01/20/2022) 8 tablet 0  ? dicyclomine (BENTYL) 10 MG capsule TAKE ONE CAPSULE BY MOUTH FOUR TIMES A DAY (BEFORE MEALS AND BEDTIME) (Patient not taking: Reported on 10/31/2021) 30 capsule 0  ? hydrOXYzine (VISTARIL) 50 MG capsule Take 1 capsule (50 mg total) by mouth at bedtime as needed. For sleep (Patient not taking: Reported on 10/31/2021) 30 capsule 1  ? loratadine (CLARITIN) 10 MG tablet Take 1 tablet (10 mg total) by mouth daily. (Patient not taking: Reported on 10/31/2021) 30 tablet 5  ?  meclizine (ANTIVERT) 25 MG tablet Take 1 tablet (25 mg total) by mouth 3 (three) times daily as needed for dizziness. (Patient not taking: Reported on 10/31/2021) 30 tablet 1  ? sertraline (ZOLOFT) 100 MG tablet Take 2 tablets (200 mg total) by mouth daily. 180 tablet 1  ? ?No current facility-administered medications for this visit.  ? ? ? ?Musculoskeletal: ?Strength & Muscle Tone:  UTA ?Gait & Station:  Seated ?Patient leans: N/A ? ?Psychiatric Specialty Exam: ?Review of Systems  ?Psychiatric/Behavioral:  Negative for agitation, behavioral problems, confusion, decreased concentration, dysphoric mood, hallucinations, self-injury, sleep disturbance and suicidal ideas. The patient is not nervous/anxious and is not hyperactive.   ?All other systems reviewed and are negative.  ?There were no vitals taken for this visit.There is no height or weight on file to calculate BMI.  ?General Appearance: Casual  ?Eye Contact:  Fair  ?Speech:  Clear and Coherent  ?Volume:  Normal  ?Mood:  Euthymic  ?Affect:  Congruent  ?Thought Process:  Goal Directed and  Descriptions of Associations: Intact  ?Orientation:  Full (Time, Place, and Person)  ?Thought Content: Logical   ?Suicidal Thoughts:  No  ?Homicidal Thoughts:  No  ?Memory:  Immediate;   Fair ?Recent;   F

## 2022-01-28 ENCOUNTER — Telehealth: Payer: Self-pay

## 2022-01-28 ENCOUNTER — Other Ambulatory Visit: Payer: Self-pay | Admitting: Psychiatry

## 2022-01-28 DIAGNOSIS — F908 Attention-deficit hyperactivity disorder, other type: Secondary | ICD-10-CM

## 2022-01-28 NOTE — Telephone Encounter (Signed)
pt states that the pharmacy did not have the adderall so the [facility] [address] has the adderall immediated release 20mg  so she wanted to see if that can be sent. ?

## 2022-01-28 NOTE — Telephone Encounter (Signed)
Did she mean Adderall 15 mg immediate release, or 20 mg extended release?

## 2022-01-29 NOTE — Telephone Encounter (Signed)
Pharmacy did not have the adderall 15 but they do have the adderall immediate release 20mg  ?

## 2022-02-02 ENCOUNTER — Other Ambulatory Visit: Payer: Self-pay | Admitting: Psychiatry

## 2022-02-02 MED ORDER — AMPHETAMINE-DEXTROAMPHETAMINE 20 MG PO TABS: 20.0000 mg | ORAL_TABLET | Freq: Two times a day (BID) | ORAL | 0 refills | Status: DC

## 2022-02-02 NOTE — Telephone Encounter (Signed)
Talked with the patient.  ?She states that she ran out Adderall 20 mg ER about two weeks ago; she was taking it twice a day as she did not have 15 mg IR. She has been taking IR 15 mg twice a day since she ran out of ER. Per PMP, she filled Adderall ER 20 mg on 3/7, and IR 15 mg on 3/17, each for 30 days. She agrees that this Clinical research associate will prescribe the following only for a week as a bridge until Dr. Elna Breslow returns given the complicated situation.  ?- Ordered Addrall IR 20 mg twice a day (the pharmacy does not have 15 mg IR nor 20 mg ER) for a week ?- Will route this message to Dr. Elna Breslow for review/further guidance until her next visit.  ?

## 2022-02-02 NOTE — Telephone Encounter (Signed)
pt called states she wants to speak with provider that she called and no medication has been sent.  she wants to find out what the hold up is.  ?

## 2022-02-03 ENCOUNTER — Encounter: Payer: Self-pay | Admitting: Licensed Clinical Social Worker

## 2022-02-03 ENCOUNTER — Ambulatory Visit (INDEPENDENT_AMBULATORY_CARE_PROVIDER_SITE_OTHER): Payer: 59 | Admitting: Licensed Clinical Social Worker

## 2022-02-03 DIAGNOSIS — F411 Generalized anxiety disorder: Secondary | ICD-10-CM

## 2022-02-03 NOTE — Progress Notes (Signed)
Virtual Visit via Audio Note ? ?I connected with Tracey Lester on 02/03/22 at  9:00 AM EDT by an audio enabled telemedicine application and verified that I am speaking with the correct person using two identifiers. ? ?Video connection was lost when less than 50% of the duration of the visit was complete, at which time the remainder of the visit was completed via audio only. ? ? ?Location: ?Patient: home ?Provider: remote office [place], [place]) ?  ?I discussed the limitations of evaluation and management by telemedicine and the availability of in person appointments. The patient expressed understanding and agreed to proceed. ?  ?I discussed the assessment and treatment plan with the patient. The patient was provided an opportunity to ask questions and all were answered. The patient agreed with the plan and demonstrated an understanding of the instructions. ?  ?The patient was advised to call back or seek an in-person evaluation if the symptoms worsen or if the condition fails to improve as anticipated. ? ?I provided 45 minutes of non-face-to-face time during this encounter. ? ? ?Anthonyjames Bargar R Monserratt Knezevic, LCSW ? ? ?THERAPIST PROGRESS NOTE ? ?Session Time: 915-10a ? ?Participation Level: Active ? ?Behavioral Response: CasualAlertAnxious and Depressed ? ?Type of Therapy: Individual Therapy ? ?Treatment Goals addressed:Problem: Decrease depressive symptoms and improve levels of effective functioning ?Goal: LTG: Reduce frequency, intensity, and duration of depression symptoms as evidenced by: pt self report ?Outcome: Progressing ?Goal: STG: [name] @ will participate in at least 80% of scheduled individual psychotherapy sessions ?Outcome: Progressing ?Intervention: Monitor coping skills and behavior ?Intervention: Encourage verbalization of feelings/concerns/expectations ?Intervention: REVIEW PLEASE SKILLS (TREAT PHYSICAL ILLNESS, BALANCE EATING, AVOID MOOD-ALTERING SUBSTANCES, BALANCE SLEEP AND GET EXERCISE) WITH  Enid Derry ?  ?Problem: Reduce overall frequency, intensity, and duration of the anxiety so that daily functioning is not impaired. ?Goal: LTG: Patient will score less than 5 on the Generalized Anxiety Disorder 7 Scale (GAD-7) ?Outcome: Progressing ?Intervention: Encourage patient to identify triggers ?Intervention: Assist with relaxation techniques, as appropriate (deep breathing exercises, meditation, guided imagery) ?Intervention: Encourage self-care activities ?  ?Problem: Accept the fact of chemical dependence and begin to actively participate and utilize behavioral strategies and cognitive coping skills to help maintain harm reduction behaviors ?Goal: STG: Promote adherence to treatment regimen ?Outcome: Not Progressing ? ?ProgressTowards Goals: Progressing ? ?Interventions: CBT and DBT ? ?Intervention: Encourage self-care activities ?Intervention: Encourage verbalization of feelings/concerns/expectations ?Intervention: Continue cognitive behavioral therapy for self esteem/thinking about self ?Intervention: Encourage patient to identify triggers ?Intervention: motivational interviewing re: substance use ? ?Summary: Tracey Lester is a 51 y.o. female who presents with increase in depression symptoms since last session. Pt reports overall she feels like she is managing symptoms well and is trying  very hard.  ? ?Allowed pt to explore and express thoughts and feelings associated with recent life situations and external stressors. Pt reports that she is continuing to work on her overall physical and emotional wellness. Pt reports that she feels the inner need to "serve others" and feels that this will make her feel better about her overall self worth. Discussed concept of self worth and allowed pt to identify things that make her feel better about herself.  ? ?Pt reports that she is continuing to experience body pain--sacrum area. Pt is working with a Restaurant manager, fast food and a massage therapist to help manage pain.  Discussed/reviewed other non-medication ways of managing chronic pain. Allowed pt to identify strategies that are helpful for her.  ? ?Discussed making "pie charts" to measure time management, "actual self", and "  hypothetical self". Will discuss at next session.  ? ?Pt reports that she has noticed an increase in Kratom use. Reviewed how the body develops tolerance and dependence on a substance.  ? ?Continued recommendations are as follows: self care behaviors, positive social engagements, focusing on overall work/home/life balance, and focusing on positive physical and emotional wellness. .  ? ?Suicidal/Homicidal: No ? ?Therapist Response: Pt is continuing to apply interventions learned in session into daily life situations. Pt is currently on track to meet goals utilizing interventions mentioned above. Personal growth and progress noted. Treatment to continue as indicated.  ? ?Plan: Return again in 4 weeks. ? ?Encounter Diagnosis  ?Name Primary?  ? GAD (generalized anxiety disorder) Yes  ? ? ? ?Collaboration of Care: Other Pt encouraged to continue services with psychiatrist, Dr. Shea Evans ? ?Patient/Guardian was advised Release of Information must be obtained prior to any record release in order to collaborate their care with an outside provider. Patient/Guardian was advised if they have not already done so to contact the registration department to sign all necessary forms in order for [place] to release information regarding their care.  ? ?Consent: Patient/Guardian gives verbal consent for treatment and assignment of benefits for services provided during this visit. Patient/Guardian expressed understanding and agreed to proceed.  ? ?Kaleena Corrow R Shane Badeaux, LCSW ?02/03/2022 ? ?

## 2022-02-03 NOTE — Plan of Care (Signed)
?  Problem: Decrease depressive symptoms and improve levels of effective functioning ?Goal: LTG: Reduce frequency, intensity, and duration of depression symptoms as evidenced by: pt self report ?Outcome: Progressing ?Goal: STG: [name] @ will participate in at least 80% of scheduled individual psychotherapy sessions ?Outcome: Progressing ?Intervention: Monitor coping skills and behavior ?Intervention: Encourage verbalization of feelings/concerns/expectations ?Intervention: REVIEW PLEASE SKILLS (TREAT PHYSICAL ILLNESS, BALANCE EATING, AVOID MOOD-ALTERING SUBSTANCES, BALANCE SLEEP AND GET EXERCISE) WITH Enid Derry ?  ?Problem: Reduce overall frequency, intensity, and duration of the anxiety so that daily functioning is not impaired. ?Goal: LTG: Patient will score less than 5 on the Generalized Anxiety Disorder 7 Scale (GAD-7) ?Outcome: Progressing ?Intervention: Encourage patient to identify triggers ?Intervention: Assist with relaxation techniques, as appropriate (deep breathing exercises, meditation, guided imagery) ?Intervention: Encourage self-care activities ?  ?Problem: Accept the fact of chemical dependence and begin to actively participate and utilize behavioral strategies and cognitive coping skills to help maintain harm reduction behaviors ?Goal: STG: Promote adherence to treatment regimen ?Outcome: Not Progressing ?  ?

## 2022-02-05 MED ORDER — AMPHETAMINE-DEXTROAMPHETAMINE 20 MG PO TABS: 20.0000 mg | ORAL_TABLET | Freq: Two times a day (BID) | ORAL | 0 refills | Status: DC

## 2022-02-05 NOTE — Telephone Encounter (Signed)
Contacted patient, will continue Adderall 20 mg twice a day for now.  Patient advised not to take the extended release 20 mg of the immediate release 15 mg since her dosage has been changed. ? ?We will send 1 month supply of the 20 mg to [facility] at [place]. ?

## 2022-02-24 ENCOUNTER — Ambulatory Visit (INDEPENDENT_AMBULATORY_CARE_PROVIDER_SITE_OTHER): Payer: 59 | Admitting: Licensed Clinical Social Worker

## 2022-02-24 DIAGNOSIS — F411 Generalized anxiety disorder: Secondary | ICD-10-CM | POA: Diagnosis not present

## 2022-02-24 NOTE — Plan of Care (Signed)
?  Problem: Decrease depressive symptoms and improve levels of effective functioning ?Goal: LTG: Reduce frequency, intensity, and duration of depression symptoms as evidenced by: pt self report ?Outcome: Progressing ?Goal: STG: [name] @ will participate in at least 80% of scheduled individual psychotherapy sessions ?Outcome: Progressing ?  ?Problem: Reduce overall frequency, intensity, and duration of the anxiety so that daily functioning is not impaired. ?Goal: LTG: Patient will score less than 5 on the Generalized Anxiety Disorder 7 Scale (GAD-7) ?Outcome: Progressing ?  ?Problem: Accept the fact of chemical dependence and begin to actively participate and utilize behavioral strategies and cognitive coping skills to help maintain harm reduction behaviors ?Goal: STG: Promote adherence to treatment regimen ?Outcome: Not Applicable ?Note: Unknown for this session ?  ?

## 2022-02-24 NOTE — Progress Notes (Signed)
Virtual Visit via Audio Note ? ?I connected with Tracey Lester on 02/25/22 at 11:00 AM EDT by an audio enabled telemedicine application and verified that I am speaking with the correct person using two identifiers. ? ?Location: ?Patient: home ?Provider: remote office [place], [place]) ?  ?I discussed the limitations of evaluation and management by telemedicine and the availability of in person appointments. The patient expressed understanding and agreed to proceed. ?  ?I discussed the assessment and treatment plan with the patient. The patient was provided an opportunity to ask questions and all were answered. The patient agreed with the plan and demonstrated an understanding of the instructions. ?  ?The patient was advised to call back or seek an in-person evaluation if the symptoms worsen or if the condition fails to improve as anticipated. ? ?I provided 60 minutes of non-face-to-face time during this encounter. ? ? ?Aspen Deterding R Noorah Giammona, LCSW ? ? ?THERAPIST PROGRESS NOTE ? ?Session Time: 11a-12p ? ?Participation Level: Active ? ?Behavioral Response: CasualAlertAnxious and Depressed ? ?Type of Therapy: Individual Therapy ? ?Treatment Goals addressed:Problem: Decrease depressive symptoms and improve levels of effective functioning ? ?Goal: LTG: Reduce frequency, intensity, and duration of depression symptoms as evidenced by: pt self report ?Outcome: Progressing ? ?Goal: STG: [name] @ will participate in at least 80% of scheduled individual psychotherapy sessions ?Outcome: Progressing ?  ?Problem: Reduce overall frequency, intensity, and duration of the anxiety so that daily functioning is not impaired. ?Goal: LTG: Patient will score less than 5 on the Generalized Anxiety Disorder 7 Scale (GAD-7) ?Outcome: Progressing ?  ?Problem: Accept the fact of chemical dependence and begin to actively participate and utilize behavioral strategies and cognitive coping skills to help maintain harm reduction  behaviors ? ?Goal: STG: Promote adherence to treatment regimen ?Outcome: Not Applicable ?Note: Unknown for this session ?  ?ProgressTowards Goals: Progressing ? ?Interventions: CBT and DBT ? ?Intervention: Encourage self-care activities ?Intervention: Encourage verbalization of feelings/concerns/expectations ?Intervention: Continue cognitive behavioral therapy for self esteem/thinking about self ?Intervention: Encourage patient to identify triggers ?Intervention: motivational interviewing re: substance use ? ?Summary: Tracey Lester is a 51 y.o. female who presents with increase in depression symptoms since last session. Pt reports overall she feels like she is managing symptoms well and is trying  very hard.  ? ?Allowed pt to explore and express thoughts and feelings associated with recent life situations and external stressors. Discussed patient's relationship with her mother at length. Patient reports that she often worries about her mother, and her mother's thoughts and feelings associated with constantly reinventing herself. Allowed patient to explore her own experience compared to her mother's experience as far as goals towards health and wellness. Patient reports that she feels that she has to be defensive, improve her point to lots of people, including her mom. Patient reports that she has to remind herself at times that she's not trying to convince herself of anything or not trying to change someone else's mind. ? ?Patient reports that she has been feeling emotional about recent suicide death of a friend's teenager. Patient reports that this fills her with a sense of sorrow, and wanting to do something to help not just the family through their loss but the bigger mental health crisis patient reports that she knows how it feels to be in that dark place, because she has been there before in the past. Allowed patient to identify mechanisms that help pull her out of her dark place in the past, and how different  her path is now compared  to that difficult time in life. ? ?Patient reports that she is continuing to experience pain in her hips and lower back area. Patient is trying to manage the pain levels on her own, and does not want to have medical management of pain at this point in time.  Discussed how pain is impacting overall sleep quality and quantity. patient feels that her husband does not understand her pain experience, and the overall psychological impact. ? ?Continued recommendations are as follows: self care behaviors, positive social engagements, focusing on overall work/home/life balance, and focusing on positive physical and emotional wellness. .  ? ?Suicidal/Homicidal: No ? ?Therapist Response: Pt is continuing to apply interventions learned in session into daily life situations. Pt is currently on track to meet goals utilizing interventions mentioned above. Personal growth and progress noted. Treatment to continue as indicated.  ? ?Plan: Return again in 4 weeks. ? ?Encounter Diagnosis  ?Name Primary?  ? GAD (generalized anxiety disorder) Yes  ? ? ?Collaboration of Care: Other Pt encouraged to continue services with psychiatrist, Dr. Elna Breslow ? ?Patient/Guardian was advised Release of Information must be obtained prior to any record release in order to collaborate their care with an outside provider. Patient/Guardian was advised if they have not already done so to contact the registration department to sign all necessary forms in order for [place] to release information regarding their care.  ? ?Consent: Patient/Guardian gives verbal consent for treatment and assignment of benefits for services provided during this visit. Patient/Guardian expressed understanding and agreed to proceed.  ? ?Tracey Lester R Tremayne Sheldon, LCSW ?02/25/2022 ? ?

## 2022-03-15 ENCOUNTER — Other Ambulatory Visit: Payer: Self-pay | Admitting: Psychiatry

## 2022-03-15 DIAGNOSIS — F5105 Insomnia due to other mental disorder: Secondary | ICD-10-CM

## 2022-03-17 ENCOUNTER — Ambulatory Visit (INDEPENDENT_AMBULATORY_CARE_PROVIDER_SITE_OTHER): Payer: 59 | Admitting: Licensed Clinical Social Worker

## 2022-03-17 DIAGNOSIS — F411 Generalized anxiety disorder: Secondary | ICD-10-CM

## 2022-03-17 NOTE — Progress Notes (Signed)
Virtual Visit via Audio Note  I connected with [name] on 03/17/22 at 11:00 AM EDT by an audio enabled telemedicine application and verified that I am speaking with the correct person using two identifiers.  Location: Patient: home Provider: remote office [place], [place])   I discussed the limitations of evaluation and management by telemedicine and the availability of in person appointments. The patient expressed understanding and agreed to proceed.   I discussed the assessment and treatment plan with the patient. The patient was provided an opportunity to ask questions and all were answered. The patient agreed with the plan and demonstrated an understanding of the instructions.   The patient was advised to call back or seek an in-person evaluation if the symptoms worsen or if the condition fails to improve as anticipated.  I provided 60 minutes of non-face-to-face time during this encounter.   Jinx Gilden R Glendine Swetz, LCSW   THERAPIST PROGRESS NOTE  Session Time: 11a-12p  Participation Level: Active  Behavioral Response: CasualAlertAnxious and Depressed  Type of Therapy: Individual Therapy  Treatment Goals addressed:Problem: Decrease depressive symptoms and improve levels of effective functioning  Goal: LTG: Reduce frequency, intensity, and duration of depression symptoms as evidenced by: pt self report Outcome: Progressing  Goal: STG: [name] @ will participate in at least 80% of scheduled individual psychotherapy sessions Outcome: Progressing   Problem: Reduce overall frequency, intensity, and duration of the anxiety so that daily functioning is not impaired. Goal: LTG: Patient will score less than 5 on the Generalized Anxiety Disorder 7 Scale (GAD-7) Outcome: Progressing   Problem: Accept the fact of chemical dependence and begin to actively participate and utilize behavioral strategies and cognitive coping skills to help maintain harm reduction  behaviors  Goal: STG: Promote adherence to treatment regimen Outcome: Not Applicable Note: Unknown for this session   ProgressTowards Goals: Progressing  Interventions: CBT and DBT  Intervention: Encourage self-care activities Intervention: Encourage verbalization of feelings/concerns/expectations Intervention: Continue cognitive behavioral therapy for self esteem/thinking about self Intervention: Encourage patient to identify triggers Intervention: motivational interviewing re: substance use  Summary: Angy Swearengin is a 51 y.o. female who presents with increase in depression symptoms since last session. Pt reports overall she feels like she is managing symptoms well and is trying  very hard.   Allowed pt to explore and express thoughts and feelings associated with recent life situations and external stressors.Discussed recent interaction with another individual that pt looks up to--this person had higher-level goals in life that triggered some thoughts in pt. Allowed pt to explore thoughts and identify triggers. Explored pts current expectations of self and where pt is currently.  Used example of Maslow's hierarchy of needs to allow pt to see how people constantly go up and down the hierarchy depending on life needs, circumstances and external stressors.  Explored relationships and one in particular where pt feels that her friend "ghosted" her. Pt states she is working hard on not taking this personally.  Reviewed coping skills for managing mood, anxiety, stress.   Continued recommendations are as follows: self care behaviors, positive social engagements, focusing on overall work/home/life balance, and focusing on positive physical and emotional wellness. .   Suicidal/Homicidal: No  Therapist Response: Pt is continuing to apply interventions learned in session into daily life situations. Pt is currently on track to meet goals utilizing interventions mentioned above. Personal growth and  progress noted. Treatment to continue as indicated.   Plan: Return again in 4 weeks.  Encounter Diagnosis  Name Primary?   GAD (  generalized anxiety disorder) Yes     Collaboration of Care: Other Pt encouraged to continue services with psychiatrist, Dr. Elna Breslow  Patient/Guardian was advised Release of Information must be obtained prior to any record release in order to collaborate their care with an outside provider. Patient/Guardian was advised if they have not already done so to contact the registration department to sign all necessary forms in order for [place] to release information regarding their care.   Consent: Patient/Guardian gives verbal consent for treatment and assignment of benefits for services provided during this visit. Patient/Guardian expressed understanding and agreed to proceed.   Ernest Haber Bryon Parker, LCSW 03/17/2022

## 2022-03-22 ENCOUNTER — Ambulatory Visit
Admission: EM | Admit: 2022-03-22 | Discharge: 2022-03-22 | Disposition: A | Discharge status: Home / Self Care | Payer: Managed Care, Other (non HMO) | Attending: Family Medicine | Admitting: Family Medicine

## 2022-03-22 ENCOUNTER — Encounter: Payer: Self-pay | Admitting: Emergency Medicine

## 2022-03-22 DIAGNOSIS — J029 Acute pharyngitis, unspecified: Secondary | ICD-10-CM | POA: Diagnosis not present

## 2022-03-22 LAB — POCT RAPID STREP A (OFFICE): Rapid Strep A Screen: NEGATIVE

## 2022-03-22 NOTE — ED Triage Notes (Signed)
Pt presents with ST x 2 days.her daughter is currently being treated for strep.

## 2022-03-22 NOTE — ED Provider Notes (Signed)
[facility]    CSN: [id number] Arrival date & time: 03/22/22  1157      History   Chief Complaint Chief Complaint  Patient presents with   Sore Throat    HPI Tracey Lester is a 51 y.o. female.   HPI Patient presents today for evaluation sore throat x 3 days. Patient daughter was treated for 4 days ago and is currently taking prescribed antibiotics. Endorses feeling fatigue and not at her baseline. She is afebrile. Denies any other symptoms. Past Medical History:  Diagnosis Date   ADHD (attention deficit hyperactivity disorder)    Anxiety    Depression    Herpes     Patient Active Problem List   Diagnosis Date Noted   MDD (major depressive disorder), recurrent, severe, with psychosis (HCC) 09/17/2020   MDD (major depressive disorder), recurrent, in full remission (HCC) 04/22/2020   High risk medication use 04/22/2020   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   GAD (generalized anxiety disorder) 05/31/2019   MDD (major depressive disorder), recurrent episode, mild (HCC) 05/31/2019   Insomnia due to mental disorder 05/31/2019   Family history of celiac disease 11/28/2018   Generalized abdominal pain 11/28/2018   Alternating constipation and diarrhea 11/28/2018    Past Surgical History:  Procedure Laterality Date   TONSILLECTOMY      OB History   No obstetric history on file.      Home Medications    Prior to Admission medications   Medication Sig Start Date End Date Taking? Authorizing Provider  amphetamine-dextroamphetamine (ADDERALL XR) 20 MG 24 hr capsule Take 1 capsule (20 mg total) by mouth daily. 11/29/21   Jomarie LongsEappen, Saramma, MD  amphetamine-dextroamphetamine (ADDERALL XR) 20 MG 24 hr capsule Take 1 capsule (20 mg total) by mouth daily. 12/30/21   Jomarie LongsEappen, Saramma, MD  amphetamine-dextroamphetamine (ADDERALL XR) 20 MG 24 hr capsule Take 1 capsule (20 mg total) by mouth daily. 01/28/22   Jomarie LongsEappen, Saramma, MD  amphetamine-dextroamphetamine (ADDERALL) 15  MG tablet Take 1 tablet by mouth daily. 11/29/21   Jomarie LongsEappen, Saramma, MD  amphetamine-dextroamphetamine (ADDERALL) 15 MG tablet Take 1 tablet by mouth daily. Daily at noon 12/29/21   Eappen, Levin BaconSaramma, MD  amphetamine-dextroamphetamine (ADDERALL) 15 MG tablet Take 1 tablet by mouth daily. 02/07/22   Jomarie LongsEappen, Saramma, MD  amphetamine-dextroamphetamine (ADDERALL) 20 MG tablet Take 1 tablet (20 mg total) by mouth 2 (two) times daily for 7 days. 02/02/22 02/09/22  Neysa HotterHisada, Reina, MD  amphetamine-dextroamphetamine (ADDERALL) 20 MG tablet Take 1 tablet (20 mg total) by mouth 2 (two) times daily. Take 1 tablet daily at 7 AM and at 3 PM 02/07/22   Eappen, Levin BaconSaramma, MD  clonazePAM (KLONOPIN) 0.5 MG tablet TAKE ONE TABLET BY MOUTH DAILY AS NEEDED ONLY FOR SEVERE PANIC ATTACKS Patient not taking: Reported on 01/20/2022 12/01/21   Jomarie LongsEappen, Saramma, MD  dicyclomine (BENTYL) 10 MG capsule TAKE ONE CAPSULE BY MOUTH FOUR TIMES A DAY (BEFORE MEALS AND BEDTIME) Patient not taking: Reported on 10/31/2021 01/13/19   Toney ReilVanga, Rohini Reddy, MD  hydrOXYzine (VISTARIL) 50 MG capsule Take 1 capsule (50 mg total) by mouth at bedtime as needed. For sleep Patient not taking: Reported on 10/31/2021 08/24/18   Jomarie LongsEappen, Saramma, MD  lamoTRIgine (LAMICTAL) 100 MG tablet Take 1 tablet (100 mg total) by mouth daily. 12/10/21   Jomarie LongsEappen, Saramma, MD  loratadine (CLARITIN) 10 MG tablet Take 1 tablet (10 mg total) by mouth daily. Patient not taking: Reported on 10/31/2021 08/09/18   Tracey HarriesGuse, Lauren M, FNP  meclizine (  ANTIVERT) 25 MG tablet Take 1 tablet (25 mg total) by mouth 3 (three) times daily as needed for dizziness. Patient not taking: Reported on 10/31/2021 08/09/18   Tracey Harries, FNP  sertraline (ZOLOFT) 100 MG tablet Take 2 tablets (200 mg total) by mouth daily. 01/20/22   Jomarie Longs, MD  valACYclovir (VALTREX) 500 MG tablet Take 1 tablet (500 mg total) by mouth 2 (two) times daily. 10/31/21   Jomarie Longs, MD  zolpidem (AMBIEN) 10 MG tablet TAKE ONE  TABLET BY MOUTH EVERY NIGHT AT BEDTIME AS NEEDED FOR SLEEP 03/16/22   Jomarie Longs, MD    Family History Family History  Problem Relation Age of Onset   Anxiety disorder Mother    Depression Mother    Hypertension Mother    Anxiety disorder Father    Depression Father    Hypertension Father    Schizophrenia Paternal Grandmother    Depression Paternal Grandmother    Hearing loss Paternal Grandmother    Hypertension Paternal Grandmother    Depression Maternal Grandmother     Social History Social History   Tobacco Use   Smoking status: Former    Types: Cigarettes    Quit date: 10/08/2006    Years since quitting: 15.4   Smokeless tobacco: Never  Vaping Use   Vaping Use: Never used  Substance Use Topics   Alcohol use: Yes    Alcohol/week: 5.0 standard drinks    Types: 4 Glasses of wine, 1 Cans of beer per week   Drug use: No     Allergies   Codeine   Review of Systems Review of Systems Pertinent negatives listed in HPI   Physical Exam Triage Vital Signs ED Triage Vitals  Enc Vitals Group     BP 03/22/22 1259 114/76     Pulse Rate 03/22/22 1259 67     Resp 03/22/22 1259 16     Temp 03/22/22 1259 98.1 F (36.7 C)     Temp Source 03/22/22 1259 Oral     SpO2 03/22/22 1259 100 %     Weight --      Height --      Head Circumference --      Peak Flow --      Pain Score 03/22/22 1307 5     Pain Loc --      Pain Edu? --      Excl. in GC? --    No data found.  Updated Vital Signs BP 114/76 (BP Location: Left Arm)   Pulse 67   Temp 98.1 F (36.7 C) (Oral)   Resp 16   LMP 03/04/2022   SpO2 100%   Visual Acuity Right Eye Distance:   Left Eye Distance:   Bilateral Distance:    Right Eye Near:   Left Eye Near:    Bilateral Near:     Physical Exam General Appearance:    Alert, cooperative, no distress  HENT:   ENT exam normal, no neck nodes or sinus tenderness  Eyes:    PERRL, conjunctiva/corneas clear, EOM's intact       Lungs:     Clear to  auscultation bilaterally, respirations unlabored  Heart:    Regular rate and rhythm  Neurologic:   Awake, alert, oriented x 3. No apparent focal neurological           defect.        UC Treatments / Results  Labs (all labs ordered are listed, but only abnormal results are displayed)  Labs Reviewed  CULTURE, GROUP A STREP [facility])  POCT RAPID STREP A (OFFICE)    EKG   Radiology No results found.  Procedures Procedures (including critical care time)  Medications Ordered in UC Medications - No data to display  Initial Impression / Assessment and Plan / UC Course  I have reviewed the triage vital signs and the nursing notes.  Pertinent labs & imaging results that were available during my care of the patient were reviewed by me and considered in my medical decision making (see chart for details).    Acute pharyngitis, rapid step is negative. Unremarkable throat examine. Throat culture pending. Conservative management recommended. Return for evaluation as needed. Final Clinical Impressions(s) / UC Diagnoses   Final diagnoses:  Acute pharyngitis, unspecified etiology     Discharge Instructions      Throat culture pending if any bacterial growth is present, our office will contact you and prescribed treatment over the phone. Your rapid test was negative today.   I recommend conservative treatment with If you have a sore or scratchy throat, use a saltwater gargle-  to  teaspoon of salt dissolved in a 4-ounce to 8-ounce glass of warm water.  Gargle the solution for approximately 15-30 seconds and then spit.  It is important not to swallow the solution.  You can also use throat lozenges/cough drops and Chloraseptic spray to help with throat pain or discomfort.  Warm or cold liquids can also be helpful in relieving throat pain.      ED Prescriptions   None    PDMP not reviewed this encounter.   Bing Neighbors, FNP 03/22/22 1419

## 2022-03-22 NOTE — Discharge Instructions (Signed)
Throat culture pending if any bacterial growth is present, our office will contact you and prescribed treatment over the phone. Your rapid test was negative today.   I recommend conservative treatment with If you have a sore or scratchy throat, use a saltwater gargle-  to  teaspoon of salt dissolved in a 4-ounce to 8-ounce glass of warm water.  Gargle the solution for approximately 15-30 seconds and then spit.  It is important not to swallow the solution.  You can also use throat lozenges/cough drops and Chloraseptic spray to help with throat pain or discomfort.  Warm or cold liquids can also be helpful in relieving throat pain.

## 2022-03-24 ENCOUNTER — Ambulatory Visit (INDEPENDENT_AMBULATORY_CARE_PROVIDER_SITE_OTHER): Payer: 59 | Admitting: Licensed Clinical Social Worker

## 2022-03-24 ENCOUNTER — Telehealth (HOSPITAL_COMMUNITY): Payer: Self-pay | Admitting: Emergency Medicine

## 2022-03-24 DIAGNOSIS — F411 Generalized anxiety disorder: Secondary | ICD-10-CM | POA: Diagnosis not present

## 2022-03-24 LAB — CULTURE, GROUP A STREP (THRC)

## 2022-03-24 MED ORDER — AMOXICILLIN 500 MG PO CAPS: 500.0000 mg | ORAL_CAPSULE | Freq: Two times a day (BID) | ORAL | 0 refills | Status: AC

## 2022-03-24 NOTE — Plan of Care (Signed)
  Problem: Decrease depressive symptoms and improve levels of effective functioning Goal: LTG: Reduce frequency, intensity, and duration of depression symptoms as evidenced by: pt self report Outcome: Progressing Goal: STG: [name] @ will participate in at least 80% of scheduled individual psychotherapy sessions Outcome: Progressing Intervention: REVIEW PLEASE SKILLS (TREAT PHYSICAL ILLNESS, BALANCE EATING, AVOID MOOD-ALTERING SUBSTANCES, BALANCE SLEEP AND GET EXERCISE) WITH Note: Review--pt reports that she wants to start doing better Intervention: Continue cognitive behavioral therapy for Lylah--focusing on cognitive restructuring and automatic negative thoughts Note: continue   Problem: Reduce overall frequency, intensity, and duration of the anxiety so that daily functioning is not impaired. Goal: LTG: Patient will score less than 5 on the Generalized Anxiety Disorder 7 Scale (GAD-7) Outcome: Progressing   Problem: Accept the fact of chemical dependence and begin to actively participate and utilize behavioral strategies and cognitive coping skills to help maintain harm reduction behaviors Intervention: Perform motivational interviewing regarding kratom use Note: provided

## 2022-03-24 NOTE — Progress Notes (Signed)
Virtual Visit via Audio Note  I connected with [name] on 03/24/22 at 10:00 AM EDT by an audio enabled telemedicine application and verified that I am speaking with the correct person using two identifiers.  Location: Patient: home Provider: remote office [place], [place])   I discussed the limitations of evaluation and management by telemedicine and the availability of in person appointments. The patient expressed understanding and agreed to proceed.   I discussed the assessment and treatment plan with the patient. The patient was provided an opportunity to ask questions and all were answered. The patient agreed with the plan and demonstrated an understanding of the instructions.   The patient was advised to call back or seek an in-person evaluation if the symptoms worsen or if the condition fails to improve as anticipated.  I provided 40 minutes of non-face-to-face time during this encounter.   Emanuel Campos R Nathan Moctezuma, LCSW   THERAPIST PROGRESS NOTE  Session Time: 10-1040a  Participation Level: Active  Behavioral Response: CasualAlertAnxious and Depressed  Type of Therapy: Individual Therapy  Treatment Goals addressed:PProblem: Decrease depressive symptoms and improve levels of effective functioning Goal: LTG: Reduce frequency, intensity, and duration of depression symptoms as evidenced by: pt self report Outcome: Progressing Goal: STG: [name] @ will participate in at least 80% of scheduled individual psychotherapy sessions Outcome: Progressing Intervention: REVIEW PLEASE SKILLS (TREAT PHYSICAL ILLNESS, BALANCE EATING, AVOID MOOD-ALTERING SUBSTANCES, BALANCE SLEEP AND GET EXERCISE) WITH Note: Review--pt reports that she wants to start doing better Intervention: Continue cognitive behavioral therapy for Bernette--focusing on cognitive restructuring and automatic negative thoughts Note: continue   Problem: Reduce overall frequency, intensity, and duration of  the anxiety so that daily functioning is not impaired. Goal: LTG: Patient will score less than 5 on the Generalized Anxiety Disorder 7 Scale (GAD-7) Outcome: Progressing   Problem: Accept the fact of chemical dependence and begin to actively participate and utilize behavioral strategies and cognitive coping skills to help maintain harm reduction behaviors Intervention: Perform motivational interviewing regarding kratom use Note: provided   ProgressTowards Goals: Progressing  Interventions: CBT and DBT  Summary: Veronia Laprise is a 51 y.o. female who presents with continuing symptoms related to mood and anxiety.  Allowed pt to explore and express thoughts and feelings associated with recent life situations and external stressors.Patient reports that she has not had the greatest week. Patient reports that she is just not feeling herself--she is not engaging in her yoga practice, she is not making healthy choices food wise, and she feels that she is more passive aggressive when interacting with others. Praised patience overall level of self-awareness, and allowed her the space to explore any behavioral changes that she can incorporate that would help in all of these areas. Patient reports that she feels that if she made healthier choices and got back into her yoga practice and other exercising practices along with eating healthy that it will make her feel better overall.  Allow patient to explore her kratom use--patient reports that she has not stopped using throughout all of the time that we have been talking about kratom and has been taking more of it in incrementally higher and higher doses. Patient is fearful that she may not be able to stop without help.  Patient presents with a very flustered, scattered affect throughout the session.        Continued recommendations are as follows: self care behaviors, positive social engagements, focusing on overall work/home/life balance, and focusing on  positive physical and emotional wellness. 54  Suicidal/Homicidal: No  Therapist Response: Pt is continuing to apply interventions learned in session into daily life situations. Pt is currently on track to meet goals utilizing interventions mentioned above. Personal growth and progress noted. Treatment to continue as indicated.   Plan: Return again in 4 weeks.  Encounter Diagnosis  Name Primary?   GAD (generalized anxiety disorder) Yes    Collaboration of Care: Other Pt encouraged to continue services with psychiatrist, Dr. Elna Breslow  Patient/Guardian was advised Release of Information must be obtained prior to any record release in order to collaborate their care with an outside provider. Patient/Guardian was advised if they have not already done so to contact the registration department to sign all necessary forms in order for [place] to release information regarding their care.   Consent: Patient/Guardian gives verbal consent for treatment and assignment of benefits for services provided during this visit. Patient/Guardian expressed understanding and agreed to proceed.   Ernest Haber Claudis Giovanelli, LCSW 03/24/2022

## 2022-04-06 ENCOUNTER — Telehealth: Payer: Self-pay

## 2022-04-06 DIAGNOSIS — F908 Attention-deficit hyperactivity disorder, other type: Secondary | ICD-10-CM

## 2022-04-06 MED ORDER — AMPHETAMINE-DEXTROAMPHETAMINE 15 MG PO TABS: 15.0000 mg | ORAL_TABLET | Freq: Every day | ORAL | 0 refills | Status: DC

## 2022-04-06 MED ORDER — AMPHETAMINE-DEXTROAMPHET ER 20 MG PO CP24: 20.0000 mg | ORAL_CAPSULE | ORAL | 0 refills | Status: DC

## 2022-04-06 NOTE — Telephone Encounter (Signed)
pt called left message that she needs refills on all of the adderalls also the adderall xr.

## 2022-04-06 NOTE — Telephone Encounter (Signed)
I have sent the Adderall 15 mg p.o. daily, Adderall extended release 20 mg-to [facility]-date specified.  Confirmed pharmacy with patient.

## 2022-04-14 ENCOUNTER — Ambulatory Visit (INDEPENDENT_AMBULATORY_CARE_PROVIDER_SITE_OTHER): Payer: 59 | Admitting: Licensed Clinical Social Worker

## 2022-04-14 DIAGNOSIS — F411 Generalized anxiety disorder: Secondary | ICD-10-CM | POA: Diagnosis not present

## 2022-04-14 DIAGNOSIS — F3342 Major depressive disorder, recurrent, in full remission: Secondary | ICD-10-CM | POA: Diagnosis not present

## 2022-04-14 NOTE — Plan of Care (Signed)
  Problem: Decrease depressive symptoms and improve levels of effective functioning Goal: LTG: Reduce frequency, intensity, and duration of depression symptoms as evidenced by: pt self report Outcome: Progressing Goal: STG: [name] @ will participate in at least 80% of scheduled individual psychotherapy sessions Outcome: Progressing Intervention: Continue cognitive behavioral therapy for Tya--focusing on cognitive restructuring and automatic negative thoughts Note: Continued/reviewed   Problem: Reduce overall frequency, intensity, and duration of the anxiety so that daily functioning is not impaired. Goal: LTG: Patient will score less than 5 on the Generalized Anxiety Disorder 7 Scale (GAD-7) Outcome: Progressing Intervention: Assist with relaxation techniques, as appropriate (deep breathing exercises, meditation, guided imagery) Note: Reviewed--pt using regularly Intervention: Manage signs of labile or escalating emotions Note: Reviewed  Intervention: Encourage self-care activities Note: reviewed   Problem: Accept the fact of chemical dependence and begin to actively participate and utilize behavioral strategies and cognitive coping skills to help maintain harm reduction behaviors Intervention: Perform motivational interviewing regarding kratom use Note: Pt reports decrease in use

## 2022-04-14 NOTE — Progress Notes (Signed)
Virtual Visit via Video Note  I connected with Tracey Lester on 04/14/22 at  9:00 AM EDT by a video enabled telemedicine application and verified that I am speaking with the correct person using two identifiers.  Location: Patient: home Provider: remote office [place], [place])   I discussed the limitations of evaluation and management by telemedicine and the availability of in person appointments. The patient expressed understanding and agreed to proceed.   I discussed the assessment and treatment plan with the patient. The patient was provided an opportunity to ask questions and all were answered. The patient agreed with the plan and demonstrated an understanding of the instructions.   The patient was advised to call back or seek an in-person evaluation if the symptoms worsen or if the condition fails to improve as anticipated.  I provided  60 minutes of non-face-to-face time during this encounter.   Danyal Whitenack R Kamora Vossler, LCSW   THERAPIST PROGRESS NOTE  Session Time: 9-10a  Participation Level: Active  Behavioral Response: CasualAlertAnxious and Depressed  Type of Therapy: Individual Therapy  Treatment Goals addressed:Problem: Decrease depressive symptoms and improve levels of effective functioning Goal: LTG: Reduce frequency, intensity, and duration of depression symptoms as evidenced by: pt self report Outcome: Progressing Goal: STG: [name] @ will participate in at least 80% of scheduled individual psychotherapy sessions Outcome: Progressing Intervention: Continue cognitive behavioral therapy for Tracey Lester--focusing on cognitive restructuring and automatic negative thoughts Note: Continued/reviewed   Problem: Reduce overall frequency, intensity, and duration of the anxiety so that daily functioning is not impaired. Goal: LTG: Patient will score less than 5 on the Generalized Anxiety Disorder 7 Scale (GAD-7) Outcome: Progressing Intervention: Assist with relaxation  techniques, as appropriate (deep breathing exercises, meditation, guided imagery) Note: Reviewed--pt using regularly Intervention: Manage signs of labile or escalating emotions Note: Reviewed  Intervention: Encourage self-care activities Note: reviewed   Problem: Accept the fact of chemical dependence and begin to actively participate and utilize behavioral strategies and cognitive coping skills to help maintain harm reduction behaviors Intervention: Perform motivational interviewing regarding kratom use Note: Pt reports decrease in use     ProgressTowards Goals: Progressing  Interventions: CBT and DBT  Summary: Tracey Lester is a 51 y.o. female who presents with continuing symptoms related to mood and anxiety.  Patient reports that she is continuing to experience stress associated with her marriage. Allowed patient safe space to explore and express thoughts and feelings associated with the relationship, previous couples counseling that they have had, patient expectations and perspectives, and husbands expectations and perspectives. Discussed similarities and differences between patient and husbands expectations and perspectives. Patient feels that she is ready to move forward with marriage counseling again. Patient feels her husband will not be engaged in the process. Pt hoping that will change with time.   Reviewed importance of self-care and life balance behaviors. Patient reports that she is trying hard to focus on self-care on a routine basis.   Suicidal/Homicidal: No  Therapist Response: Pt is continuing to apply interventions learned in session into daily life situations. Pt is currently on track to meet goals utilizing interventions mentioned above. Personal growth and progress noted. Treatment to continue as indicated.   Continue to focus on improving self awareness, communication skills, increasing/using coping skills, and life balance.   Plan: Return again in 4  weeks.  Encounter Diagnoses  Name Primary?   GAD (generalized anxiety disorder) Yes   MDD (major depressive disorder), recurrent, in full remission (HCC)      Collaboration of Care:  Other Pt encouraged to continue services with psychiatrist, Dr. Elna Breslow  Patient/Guardian was advised Release of Information must be obtained prior to any record release in order to collaborate their care with an outside provider. Patient/Guardian was advised if they have not already done so to contact the registration department to sign all necessary forms in order for Tracey Lester to release information regarding their care.   Consent: Patient/Guardian gives verbal consent for treatment and assignment of benefits for services provided during this visit. Patient/Guardian expressed understanding and agreed to proceed.   Ernest Haber Alika Saladin, LCSW 04/14/2022

## 2022-04-21 ENCOUNTER — Ambulatory Visit (INDEPENDENT_AMBULATORY_CARE_PROVIDER_SITE_OTHER): Payer: 59 | Admitting: Psychiatry

## 2022-04-21 ENCOUNTER — Encounter: Payer: Self-pay | Admitting: Psychiatry

## 2022-04-21 VITALS — BP 112/75 | HR 76 | Temp 98.5°F | Ht 70.0 in | Wt 157.8 lb

## 2022-04-21 DIAGNOSIS — F3342 Major depressive disorder, recurrent, in full remission: Secondary | ICD-10-CM

## 2022-04-21 DIAGNOSIS — F908 Attention-deficit hyperactivity disorder, other type: Secondary | ICD-10-CM | POA: Diagnosis not present

## 2022-04-21 DIAGNOSIS — F411 Generalized anxiety disorder: Secondary | ICD-10-CM | POA: Diagnosis not present

## 2022-04-21 DIAGNOSIS — G4701 Insomnia due to medical condition: Secondary | ICD-10-CM

## 2022-04-21 MED ORDER — ZOLPIDEM TARTRATE 10 MG PO TABS: ORAL_TABLET | ORAL | 2 refills | Status: DC

## 2022-04-21 MED ORDER — AMPHETAMINE-DEXTROAMPHETAMINE 20 MG PO TABS: 20.0000 mg | ORAL_TABLET | Freq: Every morning | ORAL | 0 refills | Status: DC

## 2022-04-21 MED ORDER — AMPHETAMINE-DEXTROAMPHETAMINE 15 MG PO TABS: 15.0000 mg | ORAL_TABLET | Freq: Every day | ORAL | 0 refills | Status: DC

## 2022-04-21 MED ORDER — LAMOTRIGINE 100 MG PO TABS: 100.0000 mg | ORAL_TABLET | Freq: Every day | ORAL | 1 refills | Status: DC

## 2022-04-21 NOTE — Progress Notes (Signed)
BH MD OP Progress Note  04/21/2022 5:30 PM Tracey Lester  MRN:  [id number]  Chief Complaint:  Chief Complaint  Patient presents with   Follow-up: 51 year old Caucasian female with history of MDD, GAD, ADHD was evaluated for medication management.   HPI: Tracey Lester is a 51 year old Caucasian female, married, lives in [place], has a history of GAD, MDD, ADHD was evaluated in office today.  Patient today reports overall mood wise she is doing fairly well.  Currently compliant on her medications.  Denies side effects.  Patient however does report some sleep issues on and off due to her low back pain.  This has been going on since the past several weeks.  Patient reports currently she is trying to rest as well as using her yoga as well as essential oil for pain relief.  She agrees to discuss with primary care provider if her pain does not get better.  She is trying to find a new primary care provider.  Patient reports attention and focus as good when she takes Adderall immediate release twice a day, when she was prescribed 20 mg in the morning and 15 mg in the afternoon.  She likes it better than the Adderall extended release 20 mg.  Would like to go back to the immediate release for symptom control.  Denies side effects.  Denies use of any substances, including illicit substances over-the-counter.  Aware that she should not be combining anything like that with her current stimulants and psychotropics.  Denies any suicidality, homicidality or perceptual disturbances.  Currently trying to enjoy summer break with her children.  Visit Diagnosis:    ICD-10-CM   1. GAD (generalized anxiety disorder)  F41.1     2. MDD (major depressive disorder), recurrent, in full remission (HCC)  F33.42 lamoTRIgine (LAMICTAL) 100 MG tablet    3. Insomnia due to medical condition  G47.01 zolpidem (AMBIEN) 10 MG tablet   pain    4. Attention deficit hyperactivity disorder (ADHD), other type  F90.8  amphetamine-dextroamphetamine (ADDERALL) 20 MG tablet    amphetamine-dextroamphetamine (ADDERALL) 20 MG tablet    amphetamine-dextroamphetamine (ADDERALL) 15 MG tablet    amphetamine-dextroamphetamine (ADDERALL) 15 MG tablet    amphetamine-dextroamphetamine (ADDERALL) 20 MG tablet      Past Psychiatric History: Reviewed past psychiatric history from progress note on 01/06/2018.  Past Medical History:  Past Medical History:  Diagnosis Date   ADHD (attention deficit hyperactivity disorder)    Anxiety    Depression    Herpes     Past Surgical History:  Procedure Laterality Date   TONSILLECTOMY      Family Psychiatric History: Reviewed family psychiatric history from progress note on 01/06/2018.  Family History:  Family History  Problem Relation Age of Onset   Anxiety disorder Mother    Depression Mother    Hypertension Mother    Anxiety disorder Father    Depression Father    Hypertension Father    Schizophrenia Paternal Grandmother    Depression Paternal Grandmother    Hearing loss Paternal Grandmother    Hypertension Paternal Grandmother    Depression Maternal Grandmother     Social History: Reviewed social history from progress note on 01/06/2018. Social History   Socioeconomic History   Marital status: Married    Spouse name: michael   Number of children: 3   Years of education: Not on file   Highest education level: Associate degree: occupational, Scientist, product/process development, or vocational program  Occupational History   Not on file  Tobacco  Use   Smoking status: Former    Types: Cigarettes    Quit date: 10/08/2006    Years since quitting: 15.5   Smokeless tobacco: Never  Vaping Use   Vaping Use: Never used  Substance and Sexual Activity   Alcohol use: Yes    Alcohol/week: 5.0 standard drinks of alcohol    Types: 4 Glasses of wine, 1 Cans of beer per week   Drug use: No   Sexual activity: Yes    Birth control/protection: None  Other Topics Concern   Not on file   Social History Narrative   Not on file   Social Determinants of Health   Financial Resource Strain: Low Risk  (10/08/2017)   Overall Financial Resource Strain (CARDIA)    Difficulty of Paying Living Expenses: Not hard at all  Food Insecurity: No Food Insecurity (10/08/2017)   Hunger Vital Sign    Worried About Running Out of Food in the Last Year: Never true    Ran Out of Food in the Last Year: Never true  Transportation Needs: No Transportation Needs (10/08/2017)   PRAPARE - Administrator, Civil Service (Medical): No    Lack of Transportation (Non-Medical): No  Physical Activity: Unknown (10/08/2017)   Exercise Vital Sign    Days of Exercise per Week: 4 days    Minutes of Exercise per Session: Not on file  Stress: Stress Concern Present (10/08/2017)   [employer] of Occupational Health - Occupational Stress Questionnaire    Feeling of Stress : Very much  Social Connections: Moderately Integrated (10/08/2017)   Social Connection and Isolation Panel [NHANES]    Frequency of Communication with Friends and Family: More than three times a week    Frequency of Social Gatherings with Friends and Family: More than three times a week    Attends Religious Services: More than 4 times per year    Active Member of [employer] or Organizations: No    Attends Banker Meetings: Never    Marital Status: Married    Allergies:  Allergies  Allergen Reactions   Codeine Itching    Metabolic Disorder Labs: Lab Results  Component Value Date   HGBA1C 5.3 02/21/2021   MPG 105.41 02/21/2021   Lab Results  Component Value Date   PROLACTIN 19.6 02/21/2021   Lab Results  Component Value Date   CHOL 170 02/21/2021   TRIG 68 02/21/2021   HDL 69 02/21/2021   CHOLHDL 2.5 02/21/2021   VLDL 14 02/21/2021   LDLCALC 87 02/21/2021   Lab Results  Component Value Date   TSH 1.198 02/21/2021   TSH 2.59 08/09/2018    Therapeutic Level Labs: No results found for:  "LITHIUM" No results found for: "VALPROATE" No results found for: "CBMZ"  Current Medications: Current Outpatient Medications  Medication Sig Dispense Refill   [START ON 05/19/2022] amphetamine-dextroamphetamine (ADDERALL) 15 MG tablet Take 1 tablet by mouth daily. Take at 1 PM 30 tablet 0   [START ON 06/17/2022] amphetamine-dextroamphetamine (ADDERALL) 15 MG tablet Take 1 tablet by mouth daily. Take at 1 PM 30 tablet 0   amphetamine-dextroamphetamine (ADDERALL) 20 MG tablet Take 1 tablet (20 mg total) by mouth in the morning. 30 tablet 0   [START ON 05/19/2022] amphetamine-dextroamphetamine (ADDERALL) 20 MG tablet Take 1 tablet (20 mg total) by mouth in the morning. 30 tablet 0   [START ON 06/17/2022] amphetamine-dextroamphetamine (ADDERALL) 20 MG tablet Take 1 tablet (20 mg total) by mouth in the morning. 30 tablet  0   sertraline (ZOLOFT) 100 MG tablet Take 2 tablets (200 mg total) by mouth daily. 180 tablet 1   valACYclovir (VALTREX) 500 MG tablet Take 1 tablet (500 mg total) by mouth 2 (two) times daily. 6 tablet 0   amphetamine-dextroamphetamine (ADDERALL) 15 MG tablet Take 1 tablet by mouth daily. (Patient not taking: Reported on 04/21/2022) 30 tablet 0   clonazePAM (KLONOPIN) 0.5 MG tablet TAKE ONE TABLET BY MOUTH DAILY AS NEEDED ONLY FOR SEVERE PANIC ATTACKS (Patient not taking: Reported on 04/21/2022) 8 tablet 0   lamoTRIgine (LAMICTAL) 100 MG tablet Take 1 tablet (100 mg total) by mouth daily. 90 tablet 1   zolpidem (AMBIEN) 10 MG tablet TAKE ONE TABLET BY MOUTH EVERY NIGHT AT BEDTIME AS NEEDED FOR SLEEP 30 tablet 2   No current facility-administered medications for this visit.     Musculoskeletal: Strength & Muscle Tone: within normal limits Gait & Station: normal Patient leans: N/A  Psychiatric Specialty Exam: Review of Systems  Musculoskeletal:  Positive for back pain.  Psychiatric/Behavioral:  Positive for decreased concentration and sleep disturbance.   All other systems  reviewed and are negative.   Blood pressure 112/75, pulse 76, temperature 98.5 F (36.9 C), temperature source Temporal, height 5\' 10"  (1.778 m), weight 157 lb 12.8 oz (71.6 kg), last menstrual period 03/04/2022.Body mass index is 22.64 kg/m.  General Appearance: Casual  Eye Contact:  Fair  Speech:  Clear and Coherent  Volume:  Normal  Mood:  Euthymic  Affect:  Congruent  Thought Process:  Goal Directed and Descriptions of Associations: Intact  Orientation:  Full (Time, Place, and Person)  Thought Content: Logical   Suicidal Thoughts:  No  Homicidal Thoughts:  No  Memory:  Immediate;   Fair Recent;   Fair Remote;   Fair  Judgement:  Fair  Insight:  Fair  Psychomotor Activity:  Normal  Concentration:  Concentration: Fair and Attention Span: Fair  Recall:  05/04/2022 of Knowledge: Fair  Language: Fair  Akathisia:  No  Handed:  Right  AIMS (if indicated): done  Assets:  Communication Skills Desire for Improvement Social Support Talents/Skills Transportation  ADL's:  Intact  Cognition: WNL  Sleep:   restless at times due to pain   Screenings: AIMS    Flowsheet Row Office Visit from 04/21/2022 in [facility]  AIMS Total Score 0      GAD-7    Flowsheet Row Office Visit from 04/21/2022 in [facility] Office Visit from 10/31/2021 in [facility] Video Visit from 05/16/2021 in [facility] Video Visit from 03/18/2021 in [facility]  Total GAD-7 Score 0 1 1 3       PHQ2-9    Flowsheet Row Office Visit from 04/21/2022 in [facility] Counselor from 03/24/2022 in [facility] Video Visit from 01/20/2022 in [facility] Counselor from 01/05/2022 in [facility] Office Visit from 10/31/2021 in [facility]   PHQ-2 Total Score 0 0 0 0 0  PHQ-9 Total Score 1 -- -- -- 0      Flowsheet Row Office Visit from 04/21/2022 in [facility] Counselor from 03/24/2022 in [facility] ED from 03/22/2022 in [facility]   C-SSRS RISK CATEGORY No Risk No Risk No Risk        Assessment and Plan: Tracey Lester is a 51 year old Caucasian female who has  a history of MDD, insomnia, anxiety, ADD was evaluated in office today.  Patient with concentration problems as well as back pain which does cause sleep issues.  She will benefit from following plan.  Plan MDD in full remission Lamotrigine 100 mg p.o. daily Zoloft 200 mg p.o. daily  GAD-stable Zoloft 200 mg p.o. daily Klonopin 0.5 mg as needed for panic attacks.  Currently not using it.  Insomnia-unstable Will benefit from sufficient pain management. Continue Ambien 10 mg p.o. nightly as needed.  Advised to limit use.  Discussed habit-forming potential of Ambien.  Patient to contact primary care provider for pain management , pain is causing sleep issues.  ADHD-unstable Discontinue Adderall extended release 20 mg p.o. daily. Start Adderall 20 mg p.o. daily in the morning and 15 mg p.o. daily after lunch Provided 3 prescriptions.  Reviewed [place] PMP aware.  Continue psychotherapy sessions with [name].  Follow-up in clinic in 3 months or sooner if needed.   This note was generated in part or whole with voice recognition software. Voice recognition is usually quite accurate but there are transcription errors that can and very often do occur. I apologize for any typographical errors that were not detected and corrected.    Jomarie Longs, MD 04/22/2022, 5:36 AM

## 2022-05-12 ENCOUNTER — Ambulatory Visit (INDEPENDENT_AMBULATORY_CARE_PROVIDER_SITE_OTHER): Payer: 59 | Admitting: Licensed Clinical Social Worker

## 2022-05-12 DIAGNOSIS — F411 Generalized anxiety disorder: Secondary | ICD-10-CM

## 2022-05-12 NOTE — Plan of Care (Signed)
  Problem: Decrease depressive symptoms and improve levels of effective functioning Goal: LTG: Reduce frequency, intensity, and duration of depression symptoms as evidenced by: pt self report Outcome: Progressing Goal: STG: [name] @ will participate in at least 80% of scheduled individual psychotherapy sessions Outcome: Progressing Intervention: REVIEW PLEASE SKILLS (TREAT PHYSICAL ILLNESS, BALANCE EATING, AVOID MOOD-ALTERING SUBSTANCES, BALANCE SLEEP AND GET EXERCISE) WITH Note: Reviewed    Problem: Reduce overall frequency, intensity, and duration of the anxiety so that daily functioning is not impaired. Goal: LTG: Patient will score less than 5 on the Generalized Anxiety Disorder 7 Scale (GAD-7) Outcome: Progressing Intervention: Assist with relaxation techniques, as appropriate (deep breathing exercises, meditation, guided imagery) Note: Reviewed
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
## 2022-05-26 ENCOUNTER — Ambulatory Visit (INDEPENDENT_AMBULATORY_CARE_PROVIDER_SITE_OTHER): Payer: Self-pay | Admitting: Licensed Clinical Social Worker

## 2022-05-26 DIAGNOSIS — Z91199 Patient's noncompliance with other medical treatment and regimen due to unspecified reason: Secondary | ICD-10-CM

## 2022-05-26 NOTE — Progress Notes (Signed)
LCSW counselor tried to connect with patient for scheduled appointment via MyChart video text request x 2 and email request; also tried to connect via phone without success. LCSW counselor left message for patient to call office number to reschedule OPT appointment.   Attempt 1: Text and email: 8:06a  Attempt 2: Text and email: 8:11a  Attempt 3: phone call: 8:16a LEFT MESSAGE  Visit will be coded as no show

## 2022-06-16 ENCOUNTER — Ambulatory Visit (INDEPENDENT_AMBULATORY_CARE_PROVIDER_SITE_OTHER): Payer: 59 | Admitting: Licensed Clinical Social Worker

## 2022-06-16 DIAGNOSIS — F411 Generalized anxiety disorder: Secondary | ICD-10-CM

## 2022-06-16 NOTE — Progress Notes (Unsigned)
Virtual Visit via Video Note  I connected with Tracey Lester on 06/16/22 at 10:00 AM EDT by a video enabled telemedicine application and verified that I am speaking with the correct person using two identifiers.  Location: Patient: home Provider: remote office [place], [place])   I discussed the limitations of evaluation and management by telemedicine and the availability of in person appointments. The patient expressed understanding and agreed to proceed.   I discussed the assessment and treatment plan with the patient. The patient was provided an opportunity to ask questions and all were answered. The patient agreed with the plan and demonstrated an understanding of the instructions.   The patient was advised to call back or seek an in-person evaluation if the symptoms worsen or if the condition fails to improve as anticipated.  I provided  60 minutes of non-face-to-face time during this encounter.   Rivaldo Hineman R Zissel Biederman, LCSW   THERAPIST PROGRESS NOTE  Session Time: 10-11a  Participation Level: Active  Behavioral Response: CasualAlertAnxious and Depressed  Type of Therapy: Individual Therapy  Treatment Goals addressed: Problem: Decrease depressive symptoms and improve levels of effective functioning Goal: LTG: Reduce frequency, intensity, and duration of depression symptoms as evidenced by: pt self report Outcome: Progressing Goal: STG: [name] @ will participate in at least 80% of scheduled individual psychotherapy sessions Outcome: Progressing Intervention: REVIEW PLEASE SKILLS (TREAT PHYSICAL ILLNESS, BALANCE EATING, AVOID MOOD-ALTERING SUBSTANCES, BALANCE SLEEP AND GET EXERCISE) WITH Note: Reviewed    Problem: Reduce overall frequency, intensity, and duration of the anxiety so that daily functioning is not impaired. Goal: LTG: Patient will score less than 5 on the Generalized Anxiety Disorder 7 Scale (GAD-7) Outcome: Progressing Intervention: Assist with  relaxation techniques, as appropriate (deep breathing exercises, meditation, guided imagery) Note: Reviewed    ProgressTowards Goals: Progressing  Interventions: CBT and DBT  Summary: Tracey Lester is a 51 y.o. female who presents with improving symptoms related to mood and anxiety.Pt reports mood is overall stable with no depression symptoms since last session.   Allowed pt to explore and express thoughts and feelings associated with recent life situations and external stressors. Explored pts relationship with husband--discussed expectations, reality,and managing expectations. Reviewed communication skills, conflict management strategies. Pt reflects understanding.   Continue to focus on improving self awareness, communication skills, increasing/using coping skills, and life balance.   Plan: Return again in 4 weeks.  Encounter Diagnosis  Name Primary?   GAD (generalized anxiety disorder) Yes    Collaboration of Care: Other Pt encouraged to continue services with psychiatrist, Dr. 52  Patient/Guardian was advised Release of Information must be obtained prior to any record release in order to collaborate their care with an outside provider. Patient/Guardian was advised if they have not already done so to contact the registration department to sign all necessary forms in order for Elna Breslow to release information regarding their care.   Consent: Patient/Guardian gives verbal consent for treatment and assignment of benefits for services provided during this visit. Patient/Guardian expressed understanding and agreed to proceed.   Tracey Maymunah Stegemann, LCSW 06/16/2022

## 2022-07-07 ENCOUNTER — Telehealth (HOSPITAL_COMMUNITY): Payer: Self-pay | Admitting: Licensed Clinical Social Worker

## 2022-07-07 ENCOUNTER — Ambulatory Visit (INDEPENDENT_AMBULATORY_CARE_PROVIDER_SITE_OTHER): Payer: Self-pay | Admitting: Licensed Clinical Social Worker

## 2022-07-07 DIAGNOSIS — Z91199 Patient's noncompliance with other medical treatment and regimen due to unspecified reason: Secondary | ICD-10-CM

## 2022-07-07 NOTE — Progress Notes (Signed)
LCSW counselor attempted to connect with patient for scheduled appointment via MyChart video text request x 2 and email request with no response; also attempted to connect via phone without success. LCSW counselor left message for patient to call office number to reschedule OPT appointment.   Attempt 1: Text and email: 2:04p  Attempt 2: Text and email:2:09p  Attempt 3: phone call: 2:15p LMVM  Visit will be coded as no show 

## 2022-07-07 NOTE — Telephone Encounter (Signed)
LCSW counselor attempted to connect with patient for scheduled appointment via MyChart video text request x 2 and email request with no response; also attempted to connect via phone without success. LCSW counselor left message for patient to call office number to reschedule OPT appointment.   Attempt 1: Text and email: 2:04p  Attempt 2: Text and email:2:09p  Attempt 3: phone call: 2:15p LMVM  Visit will be coded as no show

## 2022-07-08 ENCOUNTER — Other Ambulatory Visit: Payer: Self-pay | Admitting: Psychiatry

## 2022-07-08 DIAGNOSIS — F3342 Major depressive disorder, recurrent, in full remission: Secondary | ICD-10-CM

## 2022-07-08 NOTE — Telephone Encounter (Signed)
Please contact patient to verify pharmacy , since lamictal was already sent out to another pharmacy.

## 2022-07-09 NOTE — Telephone Encounter (Signed)
Spoke with patient & asked which Rx should script be sent because It has been sent to another Rx. She asked which one gave her the # & address to [facility] @ [address]  Afterwards she said oh okay I can pick it up there.

## 2022-07-09 NOTE — Telephone Encounter (Signed)
It was sent out at her last appointment with all her other medications .

## 2022-07-20 ENCOUNTER — Telehealth: Payer: Self-pay

## 2022-07-20 ENCOUNTER — Telehealth: Payer: Self-pay | Admitting: Psychiatry

## 2022-07-20 DIAGNOSIS — F908 Attention-deficit hyperactivity disorder, other type: Secondary | ICD-10-CM

## 2022-07-20 DIAGNOSIS — F3342 Major depressive disorder, recurrent, in full remission: Secondary | ICD-10-CM

## 2022-07-20 MED ORDER — SERTRALINE HCL 100 MG PO TABS: 200.0000 mg | ORAL_TABLET | Freq: Every day | ORAL | 1 refills | Status: DC

## 2022-07-20 MED ORDER — AMPHETAMINE-DEXTROAMPHETAMINE 15 MG PO TABS: 15.0000 mg | ORAL_TABLET | Freq: Every day | ORAL | 0 refills | Status: DC

## 2022-07-20 NOTE — Telephone Encounter (Signed)
Spoke to patient, Adderall 15 mg and sertraline sent to [facility] per her request.

## 2022-07-20 NOTE — Telephone Encounter (Signed)
according to pharmacy they do not have any more rx for the adderalls.  pt will need rxs sent in.

## 2022-07-20 NOTE — Telephone Encounter (Signed)
On 06/22/2022-patient picked her Adderall 15 mg for 30 tablets.  She will be due on 07/21/2022.  I will go ahead and send prescription for the Adderall 15 mg with date specified to pharmacy for tomorrow.  On 07/07/2022-patient picked up Adderall 20 mg.  30 tablets.  She is not due at this time.   Patient just picked up her Ambien on 9/21 2023-30 tablets.  She has 1 refill left on the Lamictal at [facility] on [address].  She will need sertraline also sent to pharmacy today.   However patient had concerns about pharmacy to which medications were sent out last time.  Will need to verify what pharmacy she wants medications sent out to before it sent out.

## 2022-07-20 NOTE — Telephone Encounter (Signed)
pt left a message that she needs refills on her medication and especially the adderalls pt has appt on 9-26 last seen on  6-27-*23

## 2022-07-21 ENCOUNTER — Ambulatory Visit (INDEPENDENT_AMBULATORY_CARE_PROVIDER_SITE_OTHER): Payer: 59 | Admitting: Psychiatry

## 2022-07-21 ENCOUNTER — Encounter: Payer: Self-pay | Admitting: Psychiatry

## 2022-07-21 VITALS — BP 115/77 | HR 72 | Temp 98.3°F | Ht 70.0 in | Wt 163.0 lb

## 2022-07-21 DIAGNOSIS — F908 Attention-deficit hyperactivity disorder, other type: Secondary | ICD-10-CM | POA: Diagnosis not present

## 2022-07-21 DIAGNOSIS — G4701 Insomnia due to medical condition: Secondary | ICD-10-CM

## 2022-07-21 DIAGNOSIS — F19139 Other psychoactive substance abuse with withdrawal, unspecified: Secondary | ICD-10-CM | POA: Insufficient documentation

## 2022-07-21 DIAGNOSIS — F19939 Other psychoactive substance use, unspecified with withdrawal, unspecified: Secondary | ICD-10-CM | POA: Insufficient documentation

## 2022-07-21 DIAGNOSIS — F3342 Major depressive disorder, recurrent, in full remission: Secondary | ICD-10-CM | POA: Diagnosis not present

## 2022-07-21 DIAGNOSIS — F411 Generalized anxiety disorder: Secondary | ICD-10-CM

## 2022-07-21 MED ORDER — ZOLPIDEM TARTRATE 10 MG PO TABS: ORAL_TABLET | ORAL | 2 refills | Status: DC

## 2022-07-21 MED ORDER — AMPHETAMINE-DEXTROAMPHETAMINE 20 MG PO TABS
20.0000 mg | ORAL_TABLET | Freq: Every morning | ORAL | 0 refills | Status: DC
Start: 2022-09-03 — End: 2022-08-10

## 2022-07-21 MED ORDER — AMPHETAMINE-DEXTROAMPHETAMINE 20 MG PO TABS: 20.0000 mg | ORAL_TABLET | Freq: Every morning | ORAL | 0 refills | Status: DC

## 2022-07-21 MED ORDER — AMPHETAMINE-DEXTROAMPHETAMINE 15 MG PO TABS: 15.0000 mg | ORAL_TABLET | Freq: Every day | ORAL | 0 refills | Status: DC

## 2022-07-21 NOTE — Progress Notes (Signed)
[facility] MD OP Progress Note  07/21/2022 12:41 PM Tracey Lester  MRN:  [id number]  Chief Complaint:  Chief Complaint  Patient presents with   Follow-up   Anxiety   HPI: Tracey Lester is a 51 year old Caucasian female, married, lives in [place], has a history of GAD, MDD, ADHD was evaluated in the office today.  Patient today appeared to be pleasant.  Patient was alert, oriented to person and place time and situation.  Patient today reports overall she is doing fairly well with regards to her mood.  Denies any significant anxiety or depressive symptoms.  Reports she is compliant on her medications, her current medications are beneficial.  Continues to be on the sertraline, denies side effects.  Compliant on the Adderall as prescribed.  That does help with her focus and concentration.  Patient reports sleep is good and she takes the Ambien as needed.  Patient reports she would like to be honest about her use of Kratom, she reports she has been using these herbal product that has kratom , uses it as a tea,, has been using it every couple of hours several times a day.  She has been using it for a long time, unable to elaborate further.  Reports she would like to come off of it however when she started tapering it off she noticed withdrawal symptoms like muscular jerks hence she is worried about it.  Patient also reports she does have craving, does not really know if she can come off of it.  It has been helpful with her pain and that is why she has been using it.    Patient denies any suicidality, homicidality or perceptual disturbances.  Patient denies any other concerns today.  Visit Diagnosis:    ICD-10-CM   1. GAD (generalized anxiety disorder)  F41.1     2. MDD (major depressive disorder), recurrent, in full remission ([place])  F33.42     3. Insomnia due to medical condition  G47.01 zolpidem (AMBIEN) 10 MG tablet   pain    4. Attention deficit hyperactivity disorder (ADHD), other  type  F90.8 amphetamine-dextroamphetamine (ADDERALL) 20 MG tablet    amphetamine-dextroamphetamine (ADDERALL) 20 MG tablet    amphetamine-dextroamphetamine (ADDERALL) 15 MG tablet    5. Other psychoactive substance use, unsp with withdrawal, unsp (HCC)  F19.939    Kratom       Past Psychiatric History: Reviewed past psychiatric history from progress note on 01/06/2018  Past Medical History:  Past Medical History:  Diagnosis Date   ADHD (attention deficit hyperactivity disorder)    Anxiety    Depression    Herpes     Past Surgical History:  Procedure Laterality Date   TONSILLECTOMY      Family Psychiatric History: Reviewed family psychiatric history from progress note on 01/06/2018  Family History:  Family History  Problem Relation Age of Onset   Anxiety disorder Mother    Depression Mother    Hypertension Mother    Anxiety disorder Father    Depression Father    Hypertension Father    Schizophrenia Paternal Grandmother    Depression Paternal Grandmother    Hearing loss Paternal Grandmother    Hypertension Paternal Grandmother    Depression Maternal Grandmother     Social History: Reviewed social history from progress note on 01/06/2018 Social History   Socioeconomic History   Marital status: Married    Spouse name: michael   Number of children: 3   Years of education: Not on file  Highest education level: Associate degree: occupational, Hotel manager, or vocational program  Occupational History   Not on file  Tobacco Use   Smoking status: Former    Types: Cigarettes    Quit date: 10/08/2006    Years since quitting: 15.7   Smokeless tobacco: Never  Vaping Use   Vaping Use: Never used  Substance and Sexual Activity   Alcohol use: Yes    Alcohol/week: 5.0 standard drinks of alcohol    Types: 4 Glasses of wine, 1 Cans of beer per week   Drug use: No   Sexual activity: Yes    Birth control/protection: None  Other Topics Concern   Not on file  Social History  Narrative   Not on file   Social Determinants of Health   Financial Resource Strain: Low Risk  (10/08/2017)   Overall Financial Resource Strain (CARDIA)    Difficulty of Paying Living Expenses: Not hard at all  Food Insecurity: No Food Insecurity (10/08/2017)   Hunger Vital Sign    Worried About Running Out of Food in the Last Year: Never true    Ran Out of Food in the Last Year: Never true  Transportation Needs: No Transportation Needs (10/08/2017)   PRAPARE - Hydrologist (Medical): No    Lack of Transportation (Non-Medical): No  Physical Activity: Unknown (10/08/2017)   Exercise Vital Sign    Days of Exercise per Week: 4 days    Minutes of Exercise per Session: Not on file  Stress: Stress Concern Present (10/08/2017)   [place]    Feeling of Stress : Very much  Social Connections: Moderately Integrated (10/08/2017)   Social Connection and Isolation Panel [NHANES]    Frequency of Communication with Friends and Family: More than three times a week    Frequency of Social Gatherings with Friends and Family: More than three times a week    Attends Religious Services: More than 4 times per year    Active Member of [employer] or Organizations: No    Attends Archivist Meetings: Never    Marital Status: Married    Allergies:  Allergies  Allergen Reactions   Codeine Itching    Metabolic Disorder Labs: Lab Results  Component Value Date   HGBA1C 5.3 02/21/2021   MPG 105.41 02/21/2021   Lab Results  Component Value Date   PROLACTIN 19.6 02/21/2021   Lab Results  Component Value Date   CHOL 170 02/21/2021   TRIG 68 02/21/2021   HDL 69 02/21/2021   CHOLHDL 2.5 02/21/2021   VLDL 14 02/21/2021   LDLCALC 87 02/21/2021   Lab Results  Component Value Date   TSH 1.198 02/21/2021   TSH 2.59 08/09/2018    Therapeutic Level Labs: No results found for: "LITHIUM" No results  found for: "VALPROATE" No results found for: "CBMZ"  Current Medications: Current Outpatient Medications  Medication Sig Dispense Refill   amphetamine-dextroamphetamine (ADDERALL) 15 MG tablet Take 1 tablet by mouth daily. Take at 1 PM 30 tablet 0   amphetamine-dextroamphetamine (ADDERALL) 15 MG tablet Take 1 tablet by mouth daily. Take at 1 PM 30 tablet 0   amphetamine-dextroamphetamine (ADDERALL) 20 MG tablet Take 1 tablet (20 mg total) by mouth in the morning. 30 tablet 0   lamoTRIgine (LAMICTAL) 100 MG tablet Take 1 tablet (100 mg total) by mouth daily. 90 tablet 1   sertraline (ZOLOFT) 100 MG tablet Take 2 tablets (200 mg total) by  mouth daily. 180 tablet 1   valACYclovir (VALTREX) 500 MG tablet Take 1 tablet (500 mg total) by mouth 2 (two) times daily. 6 tablet 0   [START ON 08/19/2022] amphetamine-dextroamphetamine (ADDERALL) 15 MG tablet Take 1 tablet by mouth daily. 30 tablet 0   [START ON 08/05/2022] amphetamine-dextroamphetamine (ADDERALL) 20 MG tablet Take 1 tablet (20 mg total) by mouth in the morning. 30 tablet 0   [START ON 09/03/2022] amphetamine-dextroamphetamine (ADDERALL) 20 MG tablet Take 1 tablet (20 mg total) by mouth in the morning. 30 tablet 0   zolpidem (AMBIEN) 10 MG tablet TAKE ONE TABLET BY MOUTH EVERY NIGHT AT BEDTIME AS NEEDED FOR SLEEP 30 tablet 2   No current facility-administered medications for this visit.     Musculoskeletal: Strength & Muscle Tone: within normal limits Gait & Station: normal Patient leans: N/A  Psychiatric Specialty Exam: Review of Systems  Musculoskeletal:  Positive for back pain.       Chronic  Psychiatric/Behavioral:  The patient is nervous/anxious.   All other systems reviewed and are negative.   Blood pressure 115/77, pulse 72, temperature 98.3 F (36.8 C), height 5\' 10"  (1.778 m), weight 163 lb (73.9 kg).Body mass index is 23.39 kg/m.  General Appearance: Casual  Eye Contact:  Fair  Speech:  Clear and Coherent  Volume:   Normal  Mood:  Anxious  Affect:  Full Range  Thought Process:  Goal Directed and Descriptions of Associations: Intact  Orientation:  Full (Time, Place, and Person)  Thought Content: Logical   Suicidal Thoughts:  No  Homicidal Thoughts:  No  Memory:  Immediate;   Fair Recent;   Fair Remote;   Fair  Judgement:  Fair  Insight:  Fair  Psychomotor Activity:  Normal  Concentration:  Concentration: Fair and Attention Span: Fair  Recall:  [employer] of Knowledge: Fair  Language: Fair  Akathisia:  No  Handed:  Right  AIMS (if indicated): not done  Assets:  Communication Skills [address] for [address] Talents/Skills Transportation  ADL's:  Intact  Cognition: WNL  Sleep:  Fair   Screenings: [place] Office Visit from 04/21/2022 in [facility] Total Score 0      [place] Visit from 07/21/2022 in [facility] Visit from 04/21/2022 in [facility] Office Visit from 10/31/2021 in [facility] Video Visit from 05/16/2021 in [facility] Video Visit from 03/18/2021 in [facility]  Total GAD-7 Score 0 0 1 1 3       PHQ2-9    [place] Visit from 07/21/2022 in [facility] from 04/21/2022 in [facility] Counselor from 03/24/2022 in [facility] Video Visit from 01/20/2022 in [facility] Counselor from 01/05/2022 in [facility]  PHQ-2 Total Score 0 0 0 0 0  PHQ-9 Total Score 3 1 -- -- --      [place] Office Visit from 07/21/2022 in [facility] from 04/21/2022 in [facility] Counselor from 03/24/2022 in [facility] No Risk No Risk No Risk        Assessment and Plan: Tracey Lester is a 51 year old Caucasian female, has a history of MDD, insomnia, anxiety, ADD was evaluated in the office today.  Patient is currently stable on  medications however does report use of over-the-counter herbal product that contains kratom and is interested in coming off of it.  Discussed plan as noted below.  Plan  MDD in full remission Lamotrigine 100 mg p.o. daily Zoloft 200 mg p.o. daily  GAD-stable Zoloft 200 mg p.o. daily Discontinue clonazepam.  Insomnia-stable Continue sufficient pain management. Patient advised to establish care with primary care provider for management of pain and may also need referral to a pain provider. Ambien 10 mg p.o. nightly as needed Reviewed [place] PMP AWARE.  ADHD-improving Adderall 20 mg p.o. daily in the morning and 15 mg p.o. daily after lunch. Reviewed  PMP AWARE.  Other psychoactive substance use with withdrawal-unstable Patient advised to go to the nearest emergency department if she has any worsening withdrawal symptoms. Also provided resources for alcohol and drug service, [place], [place]. Provided education about Kratom use.  Provided education about drug to drug interaction including stimulant effect, with the use of kratom, especially when she is on psychotropic medications including Adderall.  Provided education about adverse side effects including sudden death. I have also communicated with [facility].  Patient to follow up with primary care provider and possibly with pain provider for management of pain.   Follow-up in clinic in 2 months or sooner if needed.  This note was generated in part or whole with voice recognition software. Voice recognition is usually quite accurate but there are transcription errors that can and very often do occur. I apologize for any typographical errors that were not detected and  corrected.       Ursula Alert, MD 07/21/2022, 12:41 PM

## 2022-07-22 ENCOUNTER — Telehealth (HOSPITAL_COMMUNITY): Payer: Self-pay | Admitting: Licensed Clinical Social Worker

## 2022-07-22 NOTE — Telephone Encounter (Signed)
The therapist attempts to reach [facility] by phone per a request from Dr. Shea Evans to explain to her the admission criteria or [facility] IOP.   The therapist leaves her a HIPAA-compliant voicemail with his direct callback number.  Adam Phenix, [place], LCSW, [facility], [place] 07/22/2022

## 2022-07-24 ENCOUNTER — Telehealth: Payer: 59 | Admitting: Psychiatry

## 2022-07-28 ENCOUNTER — Telehealth (HOSPITAL_COMMUNITY): Payer: Self-pay | Admitting: Licensed Clinical Social Worker

## 2022-07-28 ENCOUNTER — Ambulatory Visit (INDEPENDENT_AMBULATORY_CARE_PROVIDER_SITE_OTHER): Payer: 59 | Admitting: Licensed Clinical Social Worker

## 2022-07-28 DIAGNOSIS — F908 Attention-deficit hyperactivity disorder, other type: Secondary | ICD-10-CM

## 2022-07-28 DIAGNOSIS — F3342 Major depressive disorder, recurrent, in full remission: Secondary | ICD-10-CM

## 2022-07-28 DIAGNOSIS — F19939 Other psychoactive substance use, unspecified with withdrawal, unspecified: Secondary | ICD-10-CM

## 2022-07-28 DIAGNOSIS — F411 Generalized anxiety disorder: Secondary | ICD-10-CM

## 2022-07-28 NOTE — Progress Notes (Signed)
Virtual Visit via Video Note  I connected with Tracey Lester on 07/28/22 at 11:00 AM EDT by a video enabled telemedicine application and verified that I am speaking with the correct person using two identifiers.  Video connection was lost when less than 50% of the duration of the visit was complete, at which time the remainder of the visit was completed via audio only.  Location: Patient: home Provider: remote office [place], [place])   I discussed the limitations of evaluation and management by telemedicine and the availability of in person appointments. The patient expressed understanding and agreed to proceed.   I discussed the assessment and treatment plan with the patient. The patient was provided an opportunity to ask questions and all were answered. The patient agreed with the plan and demonstrated an understanding of the instructions.   The patient was advised to call back or seek an in-person evaluation if the symptoms worsen or if the condition fails to improve as anticipated.  I provided  60 minutes of non-face-to-face time during this encounter.   [place], LCSW   THERAPIST PROGRESS NOTE  Session Time: 11a-12p  Participation Level: Active  Behavioral Response: CasualAlertAnxious and Depressed  Type of Therapy: Individual Therapy  Treatment Goals addressed:        Problem: Decrease depressive symptoms and improve levels of effective functioning Goal: LTG: Reduce frequency, intensity, and duration of depression symptoms as evidenced by: pt self report Outcome: Progressing Goal: STG: '[name]' @ will participate in at least 80% of scheduled individual psychotherapy sessions Outcome: Progressing Intervention: Continue cognitive behavioral therapy for [place]--focusing on cognitive restructuring and automatic negative thoughts Note: review   Problem: Reduce overall frequency, intensity, and duration of the anxiety so that daily functioning is not  impaired. Goal: LTG: Patient will score less than 5 on the Generalized Anxiety Disorder 7 Scale (GAD-7) Outcome: Progressing Intervention: Encourage self-care activities Note: review Intervention: Assist with relaxation techniques, as appropriate (deep breathing exercises, meditation, guided imagery) Note: review   Problem: Accept the fact of chemical dependence and begin to actively participate and utilize behavioral strategies and cognitive coping skills to help maintain harm reduction behaviors Intervention: Perform motivational interviewing regarding kratom use Note: Pt reports that she is still using substance-refer to [facility] and/or with addiction specialist          ProgressTowards Goals: Progressing  Interventions: CBT, DBT, and Motivational Interviewing  Summary: Tracey Lester is a 51 y.o. female who presents with improving symptoms related to mood and anxiety. Pt reports that she is ready to make a change re: Kratom use.   Allowed pt to explore and express thoughts and feelings associated with recent life situations and external stressors. Pt reports that she has noticed "resentment" when she has done reflections. Pt feels she is targeting her husband at times and believes that she may be resenting the fact that her identity when she met her husband is different than currently. 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 Pt feels that she dresses a certain way, wears clothes that her husband approves, and speaks in ways only to please others and not "who I really am and want to be". Discussed/allowed pt to visualize 10 yr plan and encouraged pt to set personal goals about her "real self" and achieving that.   Discussed self care, and when pt feels most at peace. Pt feels well supported by friends and  family.  Pt reports that she is continuing Kratom use and is ready to stop because she wants to be  100% healthy and sober.   Continue to focus on improving self awareness, communication skills, increasing/using coping skills, and life balance.   Updated CCA  Plan: Return again in 4 weeks. Refer IOP or meet w/ addiction specialist.  Encounter Diagnoses  Name Primary?   MDD (major depressive disorder), recurrent, in full remission ([place]) Yes   GAD (generalized anxiety disorder)    Attention deficit hyperactivity disorder (ADHD), other type    Other psychoactive substance use, unsp with withdrawal, unsp ([place])     Collaboration of Care: Other Pt encouraged to continue services with psychiatrist, Dr. Shea Evans  Patient/Guardian was advised Release of Information must be obtained prior to any record release in order to collaborate their care with an outside provider. Patient/Guardian was advised if they have not already done so to contact the registration department to sign all necessary forms in order for [place] to release information regarding their care.   Consent: Patient/Guardian gives verbal consent for treatment and assignment of benefits for services provided during this visit. Patient/Guardian expressed understanding and agreed to proceed.   [place], LCSW 07/28/2022

## 2022-07-28 NOTE — Plan of Care (Signed)
  Problem: Decrease depressive symptoms and improve levels of effective functioning Goal: LTG: Reduce frequency, intensity, and duration of depression symptoms as evidenced by: pt self report Outcome: Progressing Goal: STG: [name] @ will participate in at least 80% of scheduled individual psychotherapy sessions Outcome: Progressing Intervention: Continue cognitive behavioral therapy for [place]--focusing on cognitive restructuring and automatic negative thoughts Note: review   Problem: Reduce overall frequency, intensity, and duration of the anxiety so that daily functioning is not impaired. Goal: LTG: Patient will score less than 5 on the Generalized Anxiety Disorder 7 Scale (GAD-7) Outcome: Progressing Intervention: Encourage self-care activities Note: review Intervention: Assist with relaxation techniques, as appropriate (deep breathing exercises, meditation, guided imagery) Note: review   Problem: Accept the fact of chemical dependence and begin to actively participate and utilize behavioral strategies and cognitive coping skills to help maintain harm reduction behaviors Intervention: Perform motivational interviewing regarding kratom use Note: Pt reports that she is still using substance-refer to [place] and/or with addiction specialist
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
## 2022-07-28 NOTE — Telephone Encounter (Signed)
The therapist calls [employer] confirming her identity via two identifiers.   Tracey Lester says that she has been tapering herself off a large amount of Kratom that she has been using for the past year.   The therapist educates her concerning the fact that Kratom does not have risks of seizures as she fears nor does it have a life-threatening withdrawal; however, the withdrawal can be very unpleasant.   Tracey Lester says that she does not want to go inpatient and has considered going camping with a friend in the next two weeks as her friend has experience managing withdrawal and [place] would be where she could not get Kratom such that she could ride it out. The therapist informs her that he has had a client see a Suboxone provider who prescribed Suboxone to help this person get off Kratom and that if she wanted to explore this option that she would need to call her insurance provider to find in-network Suboxone prescribers and call to see if the prescriber would prescribe a course of Suboxone to help her stop Kratom.  The therapist inquires about Tracey Lester's treatment history. She says that she did not have academic problems from grammar school through high school and that she was not diagnosed with ADHD until 2010; however, she was drinking at the time not telling her provider about this. She later received testing to support the ADHD diagnosis which she believes was at [facility] in [place].  She says that she has had sleep problems since childhood and that she can fall asleep but will wake up every hour or every forty-five minutes and can be up for hours. She admits that she tries to take the Ambien sparingly not wanting to get addicted to it. She admits that she was addicted to Klonopin at one point and that she did her own taper when she could no longer get it.   She had negative responses to several anti-depressants with Prozac making her too happy, Wellbutrin causing irritability, etcetera. 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   The therapist notes that per her history that it seems unlikely that she actually has ADHD and asks if a bipolar spectrum disorder has been ruled out or not. Tracey Lester admits that she too has suspected that she may have bipolar disorder but was told that she could not as her mood cycled too rapidly.  She notes that her grandmothers on both sides had issues with depression with her paternal grandmother having some form of psychosis.   Tracey Lester expresses an interest in coming in to see this therapist on Monday, October 9th, for another CCA to re-evaluate her diagnosis and so that she can possibly attend the SA IOP for support upon discontinuing Kratom.   The therapist is in communication with her primary therapist, [name], LCSW via secure chat and makes her aware of this appointment.   Adam Phenix, [place], LCSW, [facility], [place] 07/28/2022

## 2022-08-03 ENCOUNTER — Ambulatory Visit (INDEPENDENT_AMBULATORY_CARE_PROVIDER_SITE_OTHER): Payer: 59 | Admitting: Licensed Clinical Social Worker

## 2022-08-03 DIAGNOSIS — F3181 Bipolar II disorder: Secondary | ICD-10-CM

## 2022-08-03 DIAGNOSIS — F1920 Other psychoactive substance dependence, uncomplicated: Secondary | ICD-10-CM

## 2022-08-03 DIAGNOSIS — F1021 Alcohol dependence, in remission: Secondary | ICD-10-CM
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
## 2022-08-05 ENCOUNTER — Telehealth: Payer: Self-pay

## 2022-08-05 DIAGNOSIS — F908 Attention-deficit hyperactivity disorder, other type: Secondary | ICD-10-CM

## 2022-08-05 MED ORDER — AMPHETAMINE-DEXTROAMPHETAMINE 20 MG PO TABS: 20.0000 mg | ORAL_TABLET | Freq: Every morning | ORAL | 0 refills | Status: DC

## 2022-08-05 NOTE — Telephone Encounter (Signed)
Spoke to [place] at [facility] she cancelled the prescriptions for adderall

## 2022-08-05 NOTE — Telephone Encounter (Signed)
Patient called stating that [facility] is out of stock of the Adderall she is requesting that her prescription be sent to [facility] on [facility]

## 2022-08-05 NOTE — Telephone Encounter (Signed)
Please call and cancel script for Adderall sent out to [facility] so that we can send a new one to [facility].

## 2022-08-05 NOTE — Telephone Encounter (Signed)
I have sent a limited supply of Adderall 20 mg to  [place].  Patient with recent CD IOP referral as well as consultation with [place]- where Patient was honest about her drug abuse including alcoholism in the past as well as kratom use.  Patient with upcoming appointment on 08/10/2022-at that visit will discuss further changes with medications.  Patient may not be a good candidate for medications which are addictive due to her history of substance use .  Could also refer patient for neuropsychological testing for diagnostic clarification.

## 2022-08-06 ENCOUNTER — Telehealth (HOSPITAL_COMMUNITY): Payer: Self-pay | Admitting: Licensed Clinical Social Worker

## 2022-08-06 ENCOUNTER — Telehealth: Payer: Self-pay | Admitting: Psychiatry

## 2022-08-06 NOTE — Telephone Encounter (Signed)
Therapist attempts to reach [place] by phone after she leaves a voicemail sounding frustrated at not being able to get a referral from Dr. Shea Evans to see a Suboxone doctor noting that she was only able to find one.  The therapist leaves a HIPAA-compliant voicemail with his direct callback number.   [address] [place], LCSW, [facility], [facility] 08/06/2022

## 2022-08-06 NOTE — Telephone Encounter (Signed)
Patient called stating she has found a physician to prescribe the Suboxone but needs a referral. The [facility], Dr. Maurie Boettcher. Can you send the referral to fax: [phone]. Please advise.

## 2022-08-06 NOTE — Telephone Encounter (Signed)
Please have patients sign an ROI and then a referral can be send.

## 2022-08-07 NOTE — Telephone Encounter (Signed)
Referral sent 

## 2022-08-10 ENCOUNTER — Encounter: Payer: Self-pay | Admitting: Psychiatry

## 2022-08-10 ENCOUNTER — Ambulatory Visit (INDEPENDENT_AMBULATORY_CARE_PROVIDER_SITE_OTHER): Payer: 59 | Admitting: Psychiatry

## 2022-08-10 VITALS — BP 131/81 | HR 83 | Temp 98.7°F | Ht 70.0 in | Wt 160.6 lb

## 2022-08-10 DIAGNOSIS — G4701 Insomnia due to medical condition: Secondary | ICD-10-CM | POA: Diagnosis not present

## 2022-08-10 DIAGNOSIS — F908 Attention-deficit hyperactivity disorder, other type: Secondary | ICD-10-CM | POA: Diagnosis not present

## 2022-08-10 DIAGNOSIS — F19988 Other psychoactive substance use, 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: Secondary | ICD-10-CM

## 2022-08-10 DIAGNOSIS — F3342 Major depressive disorder, recurrent, in full remission: Secondary | ICD-10-CM

## 2022-08-10 DIAGNOSIS — F411 Generalized anxiety disorder: Secondary | ICD-10-CM

## 2022-08-10 DIAGNOSIS — F1011 Alcohol abuse, in remission: Secondary | ICD-10-CM

## 2022-08-10 MED ORDER — AMPHETAMINE-DEXTROAMPHETAMINE 20 MG PO TABS: 10.0000 mg | ORAL_TABLET | Freq: Every morning | ORAL | 0 refills | Status: DC

## 2022-08-10 MED ORDER — TRAZODONE HCL 50 MG PO TABS
50.0000 mg | ORAL_TABLET | Freq: Every day | ORAL | 0 refills | Status: DC
Start: 2022-08-10 — End: 2022-09-03

## 2022-08-10 NOTE — Telephone Encounter (Signed)
Thank you :)

## 2022-08-10 NOTE — Progress Notes (Unsigned)
[place] MD OP Progress Note  08/10/2022 3:49 PM Tracey Lester  MRN:  [id number]  Chief Complaint:  Chief Complaint  Patient presents with   Medication Refill   Drug Problem   Insomnia   HPI: Tracey Lester is a 51 year old Caucasian female, married, lives in [place], [place] mom has a history of GAD, MDD, ADHD was evaluated in office today.  Patient was last seen on 04/21/2022.  At that visit patient was referred for substance abuse program/counseling to [facility].  Patient had a consultation with Mr. Steele Sizer who also contacted writer and coordinated care. Per review of notes and per coordination of care with Mr. Atilano Median 08/03/2022-patient struggled with insomnia since childhood and before using substances or being on psychotropic medications she was 'just awake'.  She used to wake up 4 times per night as a child.  Her sleep problems got better at age 19 when she discovered cannabis and alcohol .  Patient currently averaging about 8 hours of sleep but this is always with use of Kratom.  Without kratom she will not get as relaxed for sleep.  She has been using kratom since 2019, has cut down from about 80 mg/day to 20 mg/day.  She also quit alcohol in 2020 noting that she drank lots of red wine and will drive around with a box of wine under her car seat.  She would drink a box of wine in 2 days.  She was arrested for DWI in 1995 and again in 2009.  She quit drinking after getting very drunk in front of her daughters.  She smoked marijuana on and off for a long time maybe using it once a week or every other week but stopped after she was started on ADHD medications.  Her mood disorder questionnaire was completed however she had a negative screen."  Patient at that visit was advised to get off of habit-forming medications/controlled substances like Adderall, Ambien.  Since the CDIOP program in [place] does not accept patients who are on these kind of medications.  Patient  also was provided resources for community, substance abuse program, Suboxone programs.  Patient today returns reporting that she has been trying to get established with a provider who prescribes Suboxone for her chronic pain so she can get off of the kratom.  The Suboxone might also help with withdrawal from kratom.  Patient reports she has been tapering herself off of the Adderall and currently takes Adderall 10 mg daily.  She used to take a 20 mg and  15 mg daily.  Patient reports she is also interested in coming off of the Ambien so she can be part of a substance abuse treatment program.  Patient reports she does struggle with sleep problems without the Ambien.  She may have tried other sleep medications in the past, although she initially reported trying trazodone, later on said she may have tried ' tramadol" for pain and not trazodone.  Agreeable to trial.  Patient with mood symptoms like sadness, mood swings, tearfulness, currently on a mood stabilizer like Lamictal as well as an SSRI.  She is agreeable to continuing these medications for now.  Denies any suicidality, homicidality or perceptual disturbances.  She is worried about the fact that since she is being honest about her drug problems, this could be used against her in the future.  Patient however would like to get help for her problems at this time since she wants to feel better and get better for her children.  This is the first time she has been honest about her drug abuse, alcoholism per history with Clinical research associate although she has been following up with Clinical research associate since the past several years.      Visit Diagnosis:    ICD-10-CM   1. GAD (generalized anxiety disorder)  F41.1     2. MDD (major depressive disorder), recurrent, in full remission (HCC)  F33.42     3. Insomnia due to medical condition  G47.01 traZODone (DESYREL) 50 MG tablet   anxiety, depression, pain, substance use ( kratom)    4. Attention deficit hyperactivity disorder  (ADHD), other type  F90.8 amphetamine-dextroamphetamine (ADDERALL) 20 MG tablet    5. 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 History of alcohol abuse  F10.11       Past Psychiatric History: Reviewed past psychiatric history from progress note on 01/06/2018.  Has previous neuropsychological testing completed by previous psychiatrist-patient agrees to sign an ROI.  If unable to get it we will send her for neuropsychological testing.  Past Medical History:  Past Medical History:  Diagnosis Date   ADHD (attention deficit hyperactivity disorder)    Anxiety    Depression    Herpes     Past Surgical History:  Procedure Laterality Date   TONSILLECTOMY      Family Psychiatric History: Reviewed family psychiatric history from progress note on 01/06/2018.  Family History:  Family History  Problem Relation Age of Onset   Anxiety disorder Mother    Depression Mother    Hypertension Mother    Anxiety disorder Father    Depression Father    Hypertension Father    Schizophrenia Paternal Grandmother    Depression Paternal Grandmother    Hearing loss Paternal Grandmother    Hypertension Paternal Grandmother    Depression Maternal Grandmother     Social History: Reviewed social history from progress note on 01/06/2018. Social History   Socioeconomic History   Marital status: Married    Spouse name: Tracey Lester   Number of children: 3   Years of education: Not on file   Highest education level: Associate degree: occupational, Scientist, product/process development, or vocational program  Occupational History   Not on file  Tobacco Use   Smoking status: Former    Types: Cigarettes    Quit date: 10/08/2006    Years since quitting: 15.8   Smokeless tobacco: Never  Vaping Use   Vaping Use: Never used  Substance and Sexual Activity   Alcohol use: Yes    Alcohol/week: 5.0 standard drinks of alcohol    Types: 4 Glasses of wine,  1 Cans of beer per week   Drug use: No   Sexual activity: Yes    Birth control/protection: None  Other Topics Concern   Not on file  Social History Narrative   Not on file   Social Determinants of Health   Financial Resource Strain: Low Risk  (10/08/2017)   Overall Financial Resource Strain (CARDIA)    Difficulty of Paying Living Expenses: Not hard at all  Food Insecurity: No Food Insecurity (10/08/2017)   Hunger Vital Sign    Worried About Running Out of Food in the Last Year: Never true    Ran Out of Food in the Last Year: Never true  Transportation Needs: No Transportation Needs (10/08/2017)   PRAPARE - Administrator, Civil Service (Medical): No    Lack of Transportation (Non-Medical): No  Physical  Activity: Unknown (10/08/2017)   Exercise Vital Sign    Days of Exercise per Week: 4 days    Minutes of Exercise per Session: Not on file  Stress: Stress Concern Present (10/08/2017)   [employer] of Occupational Health - Occupational Stress Questionnaire    Feeling of Stress : Very much  Social Connections: Moderately Integrated (10/08/2017)   Social Connection and Isolation Panel [NHANES]    Frequency of Communication with Friends and Family: More than three times a week    Frequency of Social Gatherings with Friends and Family: More than three times a week    Attends Religious Services: More than 4 times per year    Active Member of [employer] or Organizations: No    Attends Banker Meetings: Never    Marital Status: Married    Allergies:  Allergies  Allergen Reactions   Codeine Itching    Metabolic Disorder Labs: Lab Results  Component Value Date   HGBA1C 5.3 02/21/2021   MPG 105.41 02/21/2021   Lab Results  Component Value Date   PROLACTIN 19.6 02/21/2021   Lab Results  Component Value Date   CHOL 170 02/21/2021   TRIG 68 02/21/2021   HDL 69 02/21/2021   CHOLHDL 2.5 02/21/2021   VLDL 14 02/21/2021   LDLCALC 87 02/21/2021    Lab Results  Component Value Date   TSH 1.198 02/21/2021   TSH 2.59 08/09/2018    Therapeutic Level Labs: No results found for: "LITHIUM" No results found for: "VALPROATE" No results found for: "CBMZ"  Current Medications: Current Outpatient Medications  Medication Sig Dispense Refill   lamoTRIgine (LAMICTAL) 100 MG tablet Take 1 tablet (100 mg total) by mouth daily. 90 tablet 1   sertraline (ZOLOFT) 100 MG tablet Take 2 tablets (200 mg total) by mouth daily. 180 tablet 1   traZODone (DESYREL) 50 MG tablet Take 1 tablet (50 mg total) by mouth at bedtime. Stop Zolpidem 15 tablet 0   valACYclovir (VALTREX) 500 MG tablet Take 1 tablet (500 mg total) by mouth 2 (two) times daily. 6 tablet 0   amphetamine-dextroamphetamine (ADDERALL) 20 MG tablet Take 0.5 tablets (10 mg total) by mouth in the morning. 3 tablet 0   No current facility-administered medications for this visit.     Musculoskeletal: Strength & Muscle Tone: within normal limits Gait & Station: normal Patient leans: N/A  Psychiatric Specialty Exam: Review of Systems  Musculoskeletal:  Positive for arthralgias.  Psychiatric/Behavioral:  Positive for decreased concentration, dysphoric mood and sleep disturbance. The patient is nervous/anxious.   All other systems reviewed and are negative.   Blood pressure 131/81, pulse 83, temperature 98.7 F (37.1 C), temperature source Temporal, height 5\' 10"  (1.778 m), weight 160 lb 9.6 oz (72.8 kg).Body mass index is 23.04 kg/m.  General Appearance: Casual  Eye Contact:  Fair  Speech:  Clear and Coherent  Volume:  Normal  Mood:  Anxious and Depressed  Affect:  Tearful  Thought Process:  Goal Directed and Descriptions of Associations: Intact  Orientation:  Full (Time, Place, and Person)  Thought Content: Logical   Suicidal Thoughts:  No  Homicidal Thoughts:  No  Memory:  Immediate;   Fair Recent;   Fair Remote;   Fair  Judgement:  Fair  Insight:  Fair  Psychomotor  Activity:  Normal  Concentration:  Concentration: Fair and Attention Span: Fair  Recall:  of Knowledge: Fair  Language: Fair  Akathisia:  No  Handed:  Right  AIMS (  if indicated): not done  Assets:  Communication Skills Desire for Improvement Housing Social Support Transportation  ADL's:  Intact  Cognition: WNL  Sleep:  Poor   Screenings: AIMS    Flowsheet Row Office Visit from 04/21/2022 in [facility]  AIMS Total Score 0      GAD-7    Flowsheet Row Office Visit from 07/21/2022 in [facility] Office Visit from 04/21/2022 in [facility] Office Visit from 10/31/2021 in [facility] Video Visit from 05/16/2021 in [facility] Video Visit from 03/18/2021 in [facility]  Total GAD-7 Score 0 0 1 1 3       PHQ2-9    Flowsheet Row Office Visit from 07/21/2022 in [facility] Office Visit from 04/21/2022 in [facility] Counselor from 03/24/2022 in [facility] Video Visit from 01/20/2022 in [facility] Counselor from 01/05/2022 in [facility]  PHQ-2 Total Score 0 0 0 0 0  PHQ-9 Total Score 3 1 -- -- --      Flowsheet Row Office Visit from 07/21/2022 in [facility] Office Visit from 04/21/2022 in [facility] Counselor from 03/24/2022 in [facility]  C-SSRS RISK CATEGORY No Risk No Risk No Risk        Assessment and Plan: Tracey Lester is a 51 year old Caucasian female who has a history of MDD, insomnia, anxiety, ADD was evaluated in office today.  Patient is currently struggling with anxiety, mood symptoms mostly worrying about her substance use-kratom as well as the fact that she wants to  get into a treatment program, currently also trying to taper herself off of medications like Adderall, will benefit from the following plan.  Plan MDD in full remission Lamotrigine 100 mg p.o. daily Zoloft 200 mg p.o. daily  GAD-unstable Zoloft 200 mg p.o. daily Patient to continue CBT.  Insomnia-stable However since she is trying to get into a substance abuse treatment program, also given her history of addiction which she was honest about recently, discontinue Ambien. Start trazodone 50 mg p.o. nightly. Provided medication education.  ADHD-unstable Taper off Adderall.  Patient advised to start taking Adderall 10 mg p.o. daily for the next 3 days and stop taking it. Will consider starting a nonstimulant medication for ADHD symptoms in the future. Patient reports she had neuropsychological testing completed by her psychiatrist as well as another provider while she was in 56.  Patient to sign an ROI to obtain medical records. Discussed referral for neuropsychological testing if unable to request those records.  Other psychoactive substance use disorder-unstable Patient is currently motivated to get help for her kratom use. I have reviewed notes per Mr.Garrot -dated 08/03/2022.  As noted above. Of note - "Patient has never been honest about substance use history with 10/03/2022" Patient provided information for [facility], she already [facility] for [facility]. If she is unable to get into this program she could contact Clinical research associate back and she could be referred back to CD IOP program with [place]. She could also try to establish care with a pain provider for pain management using Suboxone for her chronic pain.  She does have upcoming appointment with primary care provider.  She could discuss it.  Discussed with patient once she is sober from all these substances of abuse, we could reassess her for bipolar disorder symptoms.  Will also consider referral for neuropsychological testing  for diagnostic clarification as needed.   Follow-up in clinic in 2 weeks or sooner if needed.  This note was generated in part or whole with voice recognition software. Voice recognition is usually quite accurate but there are transcription errors that can and very often do occur. I apologize for any typographical errors that were not detected and corrected.       Jomarie LongsSaramma Esaul Dorwart, MD 08/12/2022, 8:22 AM

## 2022-08-10 NOTE — Patient Instructions (Addendum)
Please call for EKG - [phone]    [facility] [place], [place] [id number] [place], [place] [address] Phone: [phone] Fax: [phone]    Trazodone Tablets What is this medication? TRAZODONE (TRAZ oh done) treats depression. It increases the amount of serotonin in the brain, a hormone that helps regulate mood. This medicine may be used for other purposes; ask your health care provider or pharmacist if you have questions. COMMON BRAND NAME(S): Desyrel What should I tell my care team before I take this medication? They need to know if you have any of these conditions: Attempted suicide or thinking about it Bipolar disorder Bleeding problems Glaucoma Heart disease, or previous heart attack Irregular heart beat Kidney or liver disease Low levels of sodium in the blood An unusual or allergic reaction to trazodone, other medications, foods, dyes or preservatives Pregnant or trying to get pregnant Breast-feeding How should I use this medication? Take this medication by mouth with a glass of water. Follow the directions on the prescription label. Take this medication shortly after a meal or a light snack. Take your medication at regular intervals. Do not take your medication more often than directed. Do not stop taking this medication suddenly except upon the advice of your care team. Stopping this medication too quickly may cause serious side effects or your condition may worsen. A special MedGuide will be given to you by the pharmacist with each prescription and refill. Be sure to read this information carefully each time. Talk to your care team regarding the use of this medication in children. Special care may be needed. Overdosage: If you think you have taken too much of this medicine contact a poison control center or emergency room at once. NOTE: This medicine is only for you. Do not share this medicine with others. What  if I miss a dose? If you miss a dose, take it as soon as you can. If it is almost time for your next dose, take only that dose. Do not take double or extra doses. What may interact with this medication? Do not take this medication with any of the following: Certain medications for fungal infections like fluconazole, itraconazole, ketoconazole, posaconazole, voriconazole Cisapride Dronedarone Linezolid MAOIs like Carbex, Eldepryl, Marplan, Nardil, and Parnate Mesoridazine Methylene blue (injected into a vein) Pimozide Saquinavir Thioridazine This medication may also interact with the following: Alcohol Antiviral medications for HIV or AIDS Aspirin and aspirin-like medications Barbiturates like phenobarbital Certain medications for blood pressure, heart disease, irregular heart beat Certain medications for depression, anxiety, or psychotic disturbances Certain medications for migraine headache like almotriptan, eletriptan, frovatriptan, naratriptan, rizatriptan, sumatriptan, zolmitriptan Certain medications for seizures like carbamazepine and phenytoin Certain medications for sleep Certain medications that treat or prevent blood clots like dalteparin, enoxaparin, warfarin Digoxin Fentanyl Lithium NSAIDS, medications for pain and inflammation, like ibuprofen or naproxen Other medications that prolong the QT interval (cause an abnormal heart rhythm) like dofetilide Rasagiline Supplements like St. John's wort, kava kava, valerian Tramadol Tryptophan This list may not describe all possible interactions. Give your health care provider a list of all the medicines, herbs, non-prescription drugs, or dietary supplements you use. Also tell them if you smoke, drink alcohol, or use illegal drugs. Some items may interact with your medicine. What should I watch for while using this medication? Tell your care team if your symptoms do not get better or if they get worse. Visit your care team for  regular checks on your  progress. Because it may take several weeks to see the full effects of this medication, it is important to continue your treatment as prescribed by your care team. Watch for new or worsening thoughts of suicide or depression. This includes sudden changes in mood, behaviors, or thoughts. These changes can happen at any time but are more common in the beginning of treatment or after a change in dose. Call your care team right away if you experience these thoughts or worsening depression. Manic episodes may happen in patients with bipolar disorder who take this medication. Watch for changes in feelings or behaviors such as feeling anxious, nervous, agitated, panicky, irritable, hostile, aggressive, impulsive, severely restless, overly excited and hyperactive, or trouble sleeping. These changes can happen at any time but are more common in the beginning of treatment or after a change in dose. Call your care team right away if you notice any of these symptoms. You may get drowsy or dizzy. Do not drive, use machinery, or do anything that needs mental alertness until you know how this medication affects you. Do not stand or sit up quickly, especially if you are an older patient. This reduces the risk of dizzy or fainting spells. Alcohol may interfere with the effect of this medication. Avoid alcoholic drinks. This medication may cause dry eyes and blurred vision. If you wear contact lenses you may feel some discomfort. Lubricating drops may help. See your eye doctor if the problem does not go away or is severe. Your mouth may get dry. Chewing sugarless gum, sucking hard candy and drinking plenty of water may help. Contact your care team if the problem does not go away or is severe. What side effects may I notice from receiving this medication? Side effects that you should report to your care team as soon as possible: Allergic reactions--skin rash, itching, hives, swelling of the face, lips,  tongue, or throat Bleeding--bloody or black, tar-like stools, red or dark brown urine, vomiting blood or brown material that looks like coffee grounds, small, red or purple spots on skin, unusual bleeding or bruising Heart rhythm changes--fast or irregular heartbeat, dizziness, feeling faint or lightheaded, chest pain, trouble breathing Low blood pressure--dizziness, feeling faint or lightheaded, blurry vision Low sodium level--muscle weakness, fatigue, dizziness, headache, confusion Prolonged or painful erection Serotonin syndrome--irritability, confusion, fast or irregular heartbeat, muscle stiffness, twitching muscles, sweating, high fever, seizures, chills, vomiting, diarrhea Sudden eye pain or change in vision such as blurry vision, seeing halos around lights, vision loss Thoughts of suicide or self-harm, worsening mood, feelings of depression Side effects that usually do not require medical attention (report to your care team if they continue or are bothersome): Change in sex drive or performance Constipation Dizziness Drowsiness Dry mouth This list may not describe all possible side effects. Call your doctor for medical advice about side effects. You may report side effects to FDA at 1-800-FDA-1088. Where should I keep my medication? Keep out of the reach of children and pets. Store at room temperature between 15 and 30 degrees C (59 to 86 degrees F). Protect from light. Keep container tightly closed. Throw away any unused medication after the expiration date. NOTE: This sheet is a summary. It may not cover all possible information. If you have questions about this medicine, talk to your doctor, pharmacist, or health care provider.  2023 Elsevier/Gold Standard (2020-10-02 00:00:00)

## 2022-08-24 ENCOUNTER — Telehealth (HOSPITAL_COMMUNITY): Payer: Self-pay | Admitting: Licensed Clinical Social Worker

## 2022-08-24 NOTE — Telephone Encounter (Signed)
Pt called office requesting clinician return call--also asked to be placed on call list for earlier appointment.  Called pt with no answer--LMVM that clinician will try back another time.  12:25pm

## 2022-08-25 NOTE — Telephone Encounter (Signed)
Attempted to reach pt again 10/31@11 :43am with no answer. LMVM.

## 2022-08-27 ENCOUNTER — Ambulatory Visit (INDEPENDENT_AMBULATORY_CARE_PROVIDER_SITE_OTHER): Payer: 59 | Admitting: Licensed Clinical Social Worker

## 2022-08-27 DIAGNOSIS — F411 Generalized anxiety disorder: Secondary | ICD-10-CM | POA: Diagnosis not present

## 2022-08-27 DIAGNOSIS — F19939 Other psychoactive substance use, unspecified with withdrawal, unspecified: Secondary | ICD-10-CM | POA: Diagnosis not present

## 2022-08-27 NOTE — Plan of Care (Signed)
  Problem: Decrease depressive symptoms and improve levels of effective functioning Goal: LTG: Reduce frequency, intensity, and duration of depression symptoms as evidenced by: pt self report Outcome: Progressing Goal: STG: [name] @ will participate in at least 80% of scheduled individual psychotherapy sessions Outcome: Progressing   Problem: Reduce overall frequency, intensity, and duration of the anxiety so that daily functioning is not impaired. Goal: LTG: Patient will score less than 5 on the Generalized Anxiety Disorder 7 Scale (GAD-7) Outcome: Progressing Intervention: Assist with relaxation techniques, as appropriate (deep breathing exercises, meditation, guided imagery) Note: Reviewed  Intervention: Encourage self-care activities Note: Continued to encourage   Problem: Accept the fact of chemical dependence and begin to actively participate and utilize behavioral strategies and cognitive coping skills to help maintain harm reduction behaviors Intervention: Perform motivational interviewing regarding kratom use Note: Pt reports that she is decreasing use by 50%. Was taking 10 units now taking 5.

## 2022-08-27 NOTE — Progress Notes (Signed)
Virtual Visit via Video Note  I connected with [name] on 08/27/22 at 10:00 AM EDT by a video enabled telemedicine application and verified that I am speaking with the correct person using two identifiers.  Video connection was lost when less than 50% of the duration of the visit was complete, at which time the remainder of the visit was completed via audio only.  Location: Patient: home Provider: remote office [place], [place])   I discussed the limitations of evaluation and management by telemedicine and the availability of in person appointments. The patient expressed understanding and agreed to proceed.   I discussed the assessment and treatment plan with the patient. The patient was provided an opportunity to ask questions and all were answered. The patient agreed with the plan and demonstrated an understanding of the instructions.   The patient was advised to call back or seek an in-person evaluation if the symptoms worsen or if the condition fails to improve as anticipated.  I provided  60 minutes of non-face-to-face time during this encounter.   Crissie Aloi R Myrtha Tonkovich, LCSW   THERAPIST PROGRESS NOTE  Session Time: 10-11a  Participation Level: Active  Behavioral Response: CasualAlertAnxious, Depressed, and motivated  Type of Therapy: Individual Therapy  Treatment Goals addressed:    Problem: Decrease depressive symptoms and improve levels of effective functioning Goal: LTG: Reduce frequency, intensity, and duration of depression symptoms as evidenced by: pt self report Outcome: Progressing Goal: STG: [name] @ will participate in at least 80% of scheduled individual psychotherapy sessions Outcome: Progressing   Problem: Reduce overall frequency, intensity, and duration of the anxiety so that daily functioning is not impaired. Goal: LTG: Patient will score less than 5 on the Generalized Anxiety Disorder 7 Scale (GAD-7) Outcome: Progressing Intervention:  Assist with relaxation techniques, as appropriate (deep breathing exercises, meditation, guided imagery) Note: Reviewed  Intervention: Encourage self-care activities Note: Continued to encourage   Problem: Accept the fact of chemical dependence and begin to actively participate and utilize behavioral strategies and cognitive coping skills to help maintain harm reduction behaviors Intervention: Perform motivational interviewing regarding kratom use Note: Pt reports that she is decreasing use by 50%. Was taking 10 units now taking 5   ProgressTowards Goals: Progressing  Interventions: CBT, DBT, and Motivational Interviewing  Summary: Cyana Shook is a 51 y.o. female who presents with improving symptoms related to mood and anxiety.   Allowed pt to explore and express thoughts and feelings associated with recent life situations and external stressors. Allowed pt to process through thoughts associated with relationship w/ psychiatrist, counselors, and overall personal growth and personal setbacks. Pt is thankful to have providers that "just don't say whatever just to agree with me".  Assessed pts overall motivation to change and pt is in the action phase: pt has decreased kratom use from 10 units to 5 units. Pt reports that she has stopped using Adderall and Ambien. Discussed total sobriety--pt has goals of stopping all kratom use. Pt reports that she researched suboxone use as tx for withdrawals. Pt is not comfortable going that route. Pt is able to articulate the negative impact kratom use has on her life and relationships with others. Pt states her family is aware of the kratom use but pt is embarrassed for them to know "how addicted" pt has been to the substance. Pt still on the fence about "whether I need to go somewhere" for treatment.  Encouraged pts continuing honesty about her behavior and reassured pt that clinicians will meet her where she  is and offered unconditional positive support.  Encouraged pt to use positive self talk/coaching.  Pt reports that she is using her coping skills for managing self and feels that she is doing well--pt continuing to practice yoga, engage socially with others--and pt reports that she is feeling more in control of herself and her actions than she has in a long time.   Continue to focus on improving self awareness, communication skills, increasing/using coping skills, and life balance.   Plan: Return again in 4 weeks. Pt to be seen more frequently--needs additional supports to assist with continued path towards sobriety.   Encounter Diagnoses  Name Primary?   GAD (generalized anxiety disorder) Yes   Other psychoactive substance use, unsp with withdrawal, unsp (HCC)     Collaboration of Care: Other Pt was given referral to [facility] by Dr. Elna Breslow  Patient/Guardian was advised Release of Information must be obtained prior to any record release in order to collaborate their care with an outside provider. Patient/Guardian was advised if they have not already done so to contact the registration department to sign all necessary forms in order for [place] to release information regarding their care.   Consent: Patient/Guardian gives verbal consent for treatment and assignment of benefits for services provided during this visit. Patient/Guardian expressed understanding and agreed to proceed.   Ernest Haber [place] Coffin, LCSW 08/27/2022

## 2022-09-03 ENCOUNTER — Ambulatory Visit: Payer: Managed Care, Other (non HMO)

## 2022-09-03 ENCOUNTER — Encounter: Payer: Self-pay | Admitting: Psychiatry

## 2022-09-03 ENCOUNTER — Ambulatory Visit (INDEPENDENT_AMBULATORY_CARE_PROVIDER_SITE_OTHER): Payer: 59 | Admitting: Psychiatry

## 2022-09-03 VITALS — BP 135/85 | HR 66 | Temp 99.2°F | Ht 70.0 in | Wt 160.6 lb

## 2022-09-03 DIAGNOSIS — G4701 Insomnia due to medical condition: Secondary | ICD-10-CM | POA: Diagnosis not present

## 2022-09-03 DIAGNOSIS — Z9189 Other specified personal risk factors, not elsewhere classified: Secondary | ICD-10-CM

## 2022-09-03 DIAGNOSIS — F411 Generalized anxiety disorder: Secondary | ICD-10-CM | POA: Diagnosis not present

## 2022-09-03 DIAGNOSIS — F908 Attention-deficit hyperactivity disorder, other type: Secondary | ICD-10-CM | POA: Diagnosis not present

## 2022-09-03 DIAGNOSIS — F1011 Alcohol abuse, in remission: Secondary | ICD-10-CM

## 2022-09-03 DIAGNOSIS — F19939 Other psychoactive substance use, unspecified with withdrawal, unspecified: Secondary | ICD-10-CM

## 2022-09-03 DIAGNOSIS — F3342 Major depressive disorder, recurrent, in full remission: Secondary | ICD-10-CM | POA: Diagnosis not present

## 2022-09-03 NOTE — Progress Notes (Signed)
[facility] OP Progress Note  09/03/2022 2:06 PM Tracey Lester  MRN:  [id number]  Chief Complaint:  Chief Complaint  Patient presents with   Follow-up   Anxiety   Depression   HPI: Tracey Lester is a 51 year old Caucasian female, married, lives in [place], [place] mom has a history of GAD, MDD, ADHD, alcoholism in remission,kratom abuse was evaluated in office today.  Patient today reports she is currently tapering herself off of the kratom.  Patient reports she does have withdrawal symptoms of jerks, restlessness although it is getting better.  Patient reports overall mood symptoms continues to be improving.  Denies any significant sadness, anhedonia.  Reports anxiety is manageable.  Reports she does struggle with sleep off and on.  Reports trazodone did not help at 50 mg.  She hence stopped taking it.  She reports she wanted to try to sleep without any medication.  However she does have sleep problems, gets restless at night.  Reports she is interested in trying another medication for sleep.  Patient denies any suicidality, homicidality or perceptual disturbances.  Continues to follow-up with her therapist [name].  Does have upcoming appointment with primary care provider.  Agrees to discuss about management of pain with a provider.  Denies any other concerns today.  Visit Diagnosis:    ICD-10-CM   1. GAD (generalized anxiety disorder)  F41.1     2. MDD (major depressive disorder), recurrent, in full remission ([place])  F33.42     3. Insomnia due to medical condition  G47.01    anxiety, depression, use of kratom, pain    4. Attention deficit hyperactivity disorder (ADHD), other type  F90.8     5. Other psychoactive substance use, unsp with withdrawal, unsp (HCC)  F19.939    Kratom    6. History of alcohol abuse  F10.11     7. At risk for prolonged QT interval syndrome  Z91.89 EKG 12-Lead      Past Psychiatric History: Reviewed past psychiatric history  from progress note on 01/06/2018.  Previous neuropsychological testing completed by previous psychiatrist-patient agrees to sign an ROI.  If unable to get that will need neuropsychological testing for diagnostic clarification.  Past Medical History:  Past Medical History:  Diagnosis Date   ADHD (attention deficit hyperactivity disorder)    Anxiety    Depression    Herpes     Past Surgical History:  Procedure Laterality Date   TONSILLECTOMY     Substance abuse history: History of cannabis use and alcohol use.  Heavy abuse of alcohol, used to drink a box of wine in 2 days drove around with a box of wine under her car seat-drank a lot of wine especially red wine.  Quit alcohol in 2020.  History of DWI in 1995 and 2009. Reports ongoing kratom use -continues to use it currently, although she is weaning off. Reports a history of marijuana use on and off.  Family Psychiatric History: Reviewed the psychiatric history from progress note on 01/06/2018.  Family History:  Family History  Problem Relation Age of Onset   Anxiety disorder Mother    Depression Mother    Hypertension Mother    Anxiety disorder Father    Depression Father    Hypertension Father    Schizophrenia Paternal Grandmother    Depression Paternal Grandmother    Hearing loss Paternal Grandmother    Hypertension Paternal Grandmother    Depression Maternal Grandmother     Social History: Reviewed social history from  progress note on 01/06/2018. Social History   Socioeconomic History   Marital status: Married    Spouse name: michael   Number of children: 3   Years of education: Not on file   Highest education level: Associate degree: occupational, Hotel manager, or vocational program  Occupational History   Not on file  Tobacco Use   Smoking status: Former    Types: Cigarettes    Quit date: 10/08/2006    Years since quitting: 15.9   Smokeless tobacco: Never  Vaping Use   Vaping Use: Never used  Substance and Sexual  Activity   Alcohol use: Yes    Alcohol/week: 5.0 standard drinks of alcohol    Types: 4 Glasses of wine, 1 Cans of beer per week   Drug use: No   Sexual activity: Yes    Birth control/protection: None  Other Topics Concern   Not on file  Social History Narrative   Not on file   Social Determinants of Health   Financial Resource Strain: Low Risk  (10/08/2017)   Overall Financial Resource Strain (CARDIA)    Difficulty of Paying Living Expenses: Not hard at all  Food Insecurity: No Food Insecurity (10/08/2017)   Hunger Vital Sign    Worried About Running Out of Food in the Last Year: Never true    Ran Out of Food in the Last Year: Never true  Transportation Needs: No Transportation Needs (10/08/2017)   PRAPARE - Hydrologist (Medical): No    Lack of Transportation (Non-Medical): No  Physical Activity: Unknown (10/08/2017)   Exercise Vital Sign    Days of Exercise per Week: 4 days    Minutes of Exercise per Session: Not on file  Stress: Stress Concern Present (10/08/2017)   [place]    Feeling of Stress : Very much  Social Connections: Moderately Integrated (10/08/2017)   Social Connection and Isolation Panel [NHANES]    Frequency of Communication with Friends and Family: More than three times a week    Frequency of Social Gatherings with Friends and Family: More than three times a week    Attends Religious Services: More than 4 times per year    Active Member of [employer] or Organizations: No    Attends Archivist Meetings: Never    Marital Status: Married    Allergies:  Allergies  Allergen Reactions   Codeine Itching    Metabolic Disorder Labs: Lab Results  Component Value Date   HGBA1C 5.3 02/21/2021   MPG 105.41 02/21/2021   Lab Results  Component Value Date   PROLACTIN 19.6 02/21/2021   Lab Results  Component Value Date   CHOL 170 02/21/2021   TRIG 68  02/21/2021   HDL 69 02/21/2021   CHOLHDL 2.5 02/21/2021   VLDL 14 02/21/2021   LDLCALC 87 02/21/2021   Lab Results  Component Value Date   TSH 1.198 02/21/2021   TSH 2.59 08/09/2018    Therapeutic Level Labs: No results found for: "LITHIUM" No results found for: "VALPROATE" No results found for: "CBMZ"  Current Medications: Current Outpatient Medications  Medication Sig Dispense Refill   lamoTRIgine (LAMICTAL) 100 MG tablet Take 1 tablet (100 mg total) by mouth daily. 90 tablet 1   sertraline (ZOLOFT) 100 MG tablet Take 2 tablets (200 mg total) by mouth daily. 180 tablet 1   valACYclovir (VALTREX) 500 MG tablet Take 1 tablet (500 mg total) by mouth 2 (two) times daily.  6 tablet 0   No current facility-administered medications for this visit.     Musculoskeletal: Strength & Muscle Tone: within normal limits Gait & Station: normal Patient leans: N/A  Psychiatric Specialty Exam: Review of Systems  Psychiatric/Behavioral:  Positive for decreased concentration and sleep disturbance. The patient is nervous/anxious.   All other systems reviewed and are negative.   Blood pressure 135/85, pulse 66, temperature 99.2 F (37.3 C), temperature source Oral, height 5\' 10"  (1.778 m), weight 160 lb 9.6 oz (72.8 kg).Body mass index is 23.04 kg/m.  General Appearance: Casual  Eye Contact:  Fair  Speech:  Normal Rate  Volume:  Normal  Mood:  Anxious  Affect:  Appropriate  Thought Process:  Goal Directed and Descriptions of Associations: Intact  Orientation:  Full (Time, Place, and Person)  Thought Content: Logical   Suicidal Thoughts:  No  Homicidal Thoughts:  No  Memory:  Immediate;   Fair Recent;   Fair Remote;   Fair  Judgement:  Fair  Insight:  Fair  Psychomotor Activity:  Normal  Concentration:  Concentration: Fair and Attention Span: Fair  Recall:  [employer] of Knowledge: Fair  Language: Fair  Akathisia:  No  Handed:  Right  AIMS (if indicated): not done  Assets:   Communication Skills Desire for Improvement Housing Social Support  ADL's:  Intact  Cognition: WNL  Sleep:  Poor   Screenings: [facility] Office Visit from 04/21/2022 in [facility] Total Score 0      [place] Visit from 09/03/2022 in [facility] Office Visit from 07/21/2022 in [facility] Office Visit from 04/21/2022 in [facility] Office Visit from 10/31/2021 in [facility] Video Visit from 05/16/2021 in [place] [facility]  Total GAD-7 Score 3 0 0 1 1      PHQ2-9    [place] Visit from 09/03/2022 in [facility] Visit from 07/21/2022 in [facility] from 04/21/2022 in [facility] from 03/24/2022 in [facility] Video Visit from 01/20/2022 in [facility]  PHQ-2 Total Score 0 0 0 0 0  PHQ-9 Total Score 7 3 1  -- --      [place] Office Visit from 09/03/2022 in [facility] Office Visit from 07/21/2022 in [facility] Office Visit from 04/21/2022 in [facility] [facility] No Risk No Risk No Risk        Assessment and Plan: Feona Kuethe is a 51 year old Caucasian female who has a history of MDD, insomnia, anxiety, ADD was evaluated in office today.  Patient is currently struggling with sleep, currently coming off of kratom,, will benefit from the following plan.  Plan MDD in full remission Lamotrigine 100 mg p.o. daily Zoloft 200 mg p.o. daily  GAD-improving Zoloft 200 mg p.o. daily Continue CBT  Insomnia-unstable Discontinue trazodone 50 mg for lack of benefit. Will consider adding Elavil or doxepin  low dosage however will get an EKG prior to doing so.  If Elavil or doxepin is added we will consider reducing dosage of Zoloft to 150 mg due to drug to drug interaction, risk of serotonin syndrome.  At risk for long QT syndrome-we will order EKG.  Provided phone [phone].  ADHD per history-concerns about whether patient truly has ADHD, concerns of bipolar  disorder, as well as substance induced concentration problems, mood swings-patient agrees to sign an ROI to obtain medical records-neuropsychological testing from previous provider in [place].  She however does not clearly remember the name.  If she is unable to do so will refer for neuropsychological testing with Dr. Bryson Dames for diagnostic clarification.  Patient agreeable.  Other psychoactive since use disorder-kratom use-improving [facility]-reports she received a call from them.  She has not decided whether she wants to be in the program or not.  She was also given information for [facility]  substance abuse [facility] as well as [facility].  Patient encouraged to follow up with therapist as well as primary care provider.  Follow-up in clinic in 3 to 4 weeks or sooner if needed.   This note was generated in part or whole with voice recognition software. Voice recognition is usually quite accurate but there are transcription errors that can and very often do occur. I apologize for any typographical errors that were not detected and corrected.     Jomarie Longs, MD 09/03/2022, 2:06 PM

## 2022-09-03 NOTE — Patient Instructions (Addendum)
Please call for EKG - [phone]  Amitriptyline Tablets What is this medication? AMITRIPTYLINE (a mee TRIP ti leen) treats depression. It increases the amount of serotonin and norepinephrine in the brain, hormones that help regulate mood. It belongs to a group of medications called tricyclic antidepressants (TCAs). This medicine may be used for other purposes; ask your health care provider or pharmacist if you have questions. COMMON BRAND NAME(S): Elavil, Vanatrip What should I tell my care team before I take this medication? They need to know if you have any of these conditions: An alcohol problem Asthma, trouble breathing Bipolar disorder or schizophrenia Difficulty passing urine, prostate trouble Glaucoma Heart disease or previous heart attack Liver disease Over active thyroid Seizures Thoughts or plans of suicide, a previous suicide attempt, or family history of suicide attempt An unusual or allergic reaction to amitriptyline, other medications, foods, dyes, or preservatives Pregnant or trying to get pregnant Breast-feeding How should I use this medication? Take this medication by mouth with a drink of water. Follow the directions on the prescription label. You can take the tablets with or without food. Take your medication at regular intervals. Do not take it more often than directed. Do not stop taking this medication suddenly except upon the advice of your care team. Stopping this medication too quickly may cause serious side effects or your condition may worsen. A special MedGuide will be given to you by the pharmacist with each prescription and refill. Be sure to read this information carefully each time. Talk to your care team regarding the use of this medication in children. Special care may be needed. Overdosage: If you think you have taken too much of this medicine contact a poison control center or emergency room at once. NOTE: This medicine is only for you. Do not share  this medicine with others. What if I miss a dose? If you miss a dose, take it as soon as you can. If it is almost time for your next dose, take only that dose. Do not take double or extra doses. What may interact with this medication? Do not take this medication with any of the following: Arsenic trioxide Certain medications used to regulate abnormal heartbeat or to treat other heart conditions Cisapride Droperidol Halofantrine Linezolid MAOIs like Carbex, Eldepryl, Marplan, Nardil, and Parnate Methylene blue Other medications for mental depression Phenothiazines like perphenazine, thioridazine and chlorpromazine Pimozide Probucol Procarbazine Sparfloxacin St. John's Wort This medication may also interact with the following: Atropine and related medications like hyoscyamine, scopolamine, tolterodine and others Barbiturate medications for inducing sleep or treating seizures, like phenobarbital Cimetidine Disulfiram Ethchlorvynol Thyroid hormones such as levothyroxine Ziprasidone This list may not describe all possible interactions. Give your health care provider a list of all the medicines, herbs, non-prescription drugs, or dietary supplements you use. Also tell them if you smoke, drink alcohol, or use illegal drugs. Some items may interact with your medicine. What should I watch for while using this medication? Tell your care team if your symptoms do not get better or if they get worse. Visit your care team for regular checks on your progress. Because it may take several weeks to see the full effects of this medication, it is important to continue your treatment as prescribed by your care team. Patients and their families should watch out for new or worsening thoughts of suicide or depression. Also watch out for sudden changes in feelings such as feeling anxious, agitated, panicky, irritable, hostile, aggressive, impulsive, severely restless, overly excited and  hyperactive, or not  being able to sleep. If this happens, especially at the beginning of treatment or after a change in dose, call your care team. You may get drowsy or dizzy. Do not drive, use machinery, or do anything that needs mental alertness until you know how this medication affects you. Do not stand or sit up quickly, especially if you are an older patient. This reduces the risk of dizzy or fainting spells. Alcohol may interfere with the effect of this medication. Avoid alcoholic drinks. Do not treat yourself for coughs, colds, or allergies without asking your care team for advice. Some ingredients can increase possible side effects. Your mouth may get dry. Chewing sugarless gum or sucking hard candy, and drinking plenty of water will help. Contact your care team if the problem does not go away or is severe. This medication may cause dry eyes and blurred vision. If you wear contact lenses you may feel some discomfort. Lubricating drops may help. See your eye doctor if the problem does not go away or is severe. This medication can cause constipation. Try to have a bowel movement at least every 2 to 3 days. If you do not have a bowel movement for 3 days, call your care team. This medication can make you more sensitive to the sun. Keep out of the sun. If you cannot avoid being in the sun, wear protective clothing and use sunscreen. Do not use sun lamps or tanning beds/booths. What side effects may I notice from receiving this medication? Side effects that you should report to your care team as soon as possible: Allergic reactions--skin rash, itching, hives, swelling of the face, lips, tongue, or throat Heart rhythm changes-- fast or irregular heartbeat, dizziness, feeling faint or lightheaded, chest pain, trouble breathing Serotonin syndrome--irritability, confusion, fast or irregular heartbeat, muscle stiffness, twitching muscles, sweating, high fever, seizure, chills, vomiting, diarrhea Sudden eye pain or change in  vision such as blurry vision, seeing halos around lights, vision loss Thoughts of suicide or self-harm, worsening mood, feelings of depression Side effects that usually do not require medical attention (report to your care team if they continue or are bothersome): Change in appetite or weight Change in sex drive or performance Constipation Dizziness Drowsiness Dry mouth Tremors This list may not describe all possible side effects. Call your doctor for medical advice about side effects. You may report side effects to FDA at 1-800-FDA-1088. Where should I keep my medication? Keep out of the reach of children and pets. Store at room temperature between 20 and 25 degrees C (68 and 77 degrees F). Throw away any unused medication after the expiration date. NOTE: This sheet is a summary. It may not cover all possible information. If you have questions about this medicine, talk to your doctor, pharmacist, or health care provider.  2023 Elsevier/Gold Standard (2020-10-14 00:00:00)  Doxepin Capsules What is this medication? DOXEPIN (DOX e pin) treats depression and anxiety. It increases the amount of serotonin and norepinephrine in the brain, hormones that help regulate mood. It belongs to a group of medications called tricyclic antidepressants (TCAs). This medicine may be used for other purposes; ask your health care provider or pharmacist if you have questions. COMMON BRAND NAME(S): Sinequan What should I tell my care team before I take this medication? They need to know if you have any of these conditions: Bipolar disorder Difficulty passing urine Frequently drink alcohol Glaucoma Heart disease Liver disease Lung or breathing disease, such as asthma or sleep apnea Prostate  trouble Schizophrenia Seizures Suicidal thoughts, plans, or attempt by you or a family member An unusual or allergic reaction to doxepin, other medications, foods, dyes, or preservatives Pregnant or trying to get  pregnant Breastfeeding How should I use this medication? Take this medication by mouth with a glass of water. Follow the directions on the prescription label. Take your doses at regular intervals. Do not take your medication more often than directed. Do not stop taking this medication suddenly except upon the advice of your care team. Stopping this medication too quickly may cause serious side effects or your condition may worsen. A special MedGuide will be given to you by the pharmacist with each prescription and refill. Be sure to read this information carefully each time. Talk to your care team about the use of this medication in children. While this medication may be prescribed for children as young as 12 years for selected conditions, precautions do apply. Overdosage: If you think you have taken too much of this medicine contact a poison control center or emergency room at once. NOTE: This medicine is only for you. Do not share this medicine with others. What if I miss a dose? If you miss a dose, take it as soon as you can. If it is almost time for your next dose, take only that dose. Do not take double or extra doses. What may interact with this medication? Do not take this medication with any of the following: Arsenic trioxide Certain medications for irregular heartbeat or other heart conditions Cisapride Halofantrine Levomethadyl Linezolid MAOIs, such as Carbex, Eldepryl, Marplan, Nardil, and Parnate Methylene blue Other medications for depression Phenothiazines, such as perphenazine, thioridazine, and chlorpromazine Pimozide Procarbazine Sparfloxacin St. John's Wort This medication may also interact with the following: Cimetidine Tolazamide Ziprasidone This list may not describe all possible interactions. Give your health care provider a list of all the medicines, herbs, non-prescription drugs, or dietary supplements you use. Also tell them if you smoke, drink alcohol, or use  illegal drugs. Some items may interact with your medicine. What should I watch for while using this medication? Visit your care team for regular checks on your progress. It can take several days before you feel the full effect of this medication. If you have been taking this medication regularly for some time, do not suddenly stop taking it. You must gradually reduce the dose, or you may get severe side effects. Ask your care team for advice. Even after you stop taking this medication it can still affect your body for several days. Patients and their families should watch out for new or worsening thoughts of suicide or depression. Also watch out for sudden changes in feelings such as feeling anxious, agitated, panicky, irritable, hostile, aggressive, impulsive, severely restless, overly excited and hyperactive, or not being able to sleep. If this happens, especially at the beginning of treatment or after a change in dose, call your care team. This medication may affect your coordination, reaction time, or judgment. Do not drive or operate machinery until you know how this medication affects you. Sit up or stand slowly to reduce the risk of dizzy or fainting spells. Drinking alcohol with this medication can increase the risk of these side effects. Do not treat yourself for coughs, colds, or allergies without asking your care team for advice. Some ingredients can increase possible side effects. Your mouth may get dry. Chewing sugarless gum or sucking hard candy and drinking plenty of water may help. Contact your care team if the  problem does not go away or is severe. This medication may cause dry eyes and blurred vision. If you wear contact lenses you, may feel some discomfort. Lubricating eye drops may help. See your care team if the problem does not go away or is severe. This medication can make you more sensitive to the sun. Keep out of the sun. If you cannot avoid being in the sun, wear protective clothing  and sunscreen. Do not use sun lamps, tanning beds, or tanning booths. What side effects may I notice from receiving this medication? Side effects that you should report to your care team as soon as possible: Allergic reactions--skin rash, itching, hives, swelling of the face, lips, tongue, or throat Irritability, confusion, fast or irregular heartbeat, muscle stiffness, twitching muscles, sweating, high fever, seizure, chills, vomiting, diarrhea, which may be signs of serotonin syndrome Sudden eye pain or change in vision such as blurry vision, seeing halos around lights, vision loss Thoughts of suicide or self-harm, worsening mood, or feelings of depression Trouble passing urine Side effects that usually do not require medical attention (report to your care team if they continue or are bothersome): Change in sex drive or performance Constipation Dizziness Drowsiness Dry mouth Tremors Weight gain This list may not describe all possible side effects. Call your doctor for medical advice about side effects. You may report side effects to FDA at 1-800-FDA-1088. Where should I keep my medication? Keep out of the reach of children. Store at room temperature between 15 and 30 degrees C (59 and 86 degrees F). Throw away any unused medication after the expiration date. NOTE: This sheet is a summary. It may not cover all possible information. If you have questions about this medicine, talk to your doctor, pharmacist, or health care provider.  2023 Elsevier/Gold Standard (2021-01-16 00:00:00)

## 2022-09-04 ENCOUNTER — Ambulatory Visit
Admission: RE | Admit: 2022-09-04 | Discharge: 2022-09-04 | Disposition: A | Discharge status: Home / Self Care | Payer: Managed Care, Other (non HMO) | Source: Ambulatory Visit | Attending: Psychiatry

## 2022-09-04 DIAGNOSIS — Z9189 Other specified personal risk factors, not elsewhere classified: Secondary | ICD-10-CM | POA: Insufficient documentation

## 2022-09-04 NOTE — Telephone Encounter (Signed)
Attempted to contact patient to discuss EKG as well as starting the new medication.  Had to leave a voicemail.

## 2022-09-07 ENCOUNTER — Ambulatory Visit (INDEPENDENT_AMBULATORY_CARE_PROVIDER_SITE_OTHER): Payer: 59 | Admitting: Licensed Clinical Social Worker

## 2022-09-07 ENCOUNTER — Telehealth: Payer: Self-pay | Admitting: Psychiatry

## 2022-09-07 ENCOUNTER — Telehealth (HOSPITAL_COMMUNITY): Payer: Self-pay | Admitting: Psychiatry

## 2022-09-07 DIAGNOSIS — F411 Generalized anxiety disorder: Secondary | ICD-10-CM | POA: Diagnosis not present

## 2022-09-07 DIAGNOSIS — F3342 Major depressive disorder, recurrent, in full remission: Secondary | ICD-10-CM

## 2022-09-07 NOTE — Progress Notes (Unsigned)
Virtual Visit via Video Note  I connected with Tracey Lester on 09/07/22 at 11:00 AM EST by a video enabled telemedicine application and verified that I am speaking with the correct person using two identifiers.  Video connection was lost when less than 50% of the duration of the visit was complete, at which time the remainder of the visit was completed via audio only.  Location: Patient: home Provider: remote office [place], [place])   I discussed the limitations of evaluation and management by telemedicine and the availability of in person appointments. The patient expressed understanding and agreed to proceed.   I discussed the assessment and treatment plan with the patient. The patient was provided an opportunity to ask questions and all were answered. The patient agreed with the plan and demonstrated an understanding of the instructions.   The patient was advised to call back or seek an in-person evaluation if the symptoms worsen or if the condition fails to improve as anticipated.  I provided  60 minutes of non-face-to-face time during this encounter.   Sarahmarie Leavey R Cyntha Brickman, LCSW  THERAPIST PROGRESS NOTE  Session Time: 11a-12p  Participation Level: Active  Behavioral Response: CasualAlertAnxious, Depressed, and motivated  Type of Therapy: Individual Therapy  Treatment Goals addressed:  Problem: Decrease depressive symptoms and improve levels of effective functioning Goal: LTG: Reduce frequency, intensity, and duration of depression symptoms as evidenced by: pt self report Outcome: Not Progressing Goal: STG: [name] @ will participate in at least 80% of scheduled individual psychotherapy sessions Outcome: Progressing Intervention: REVIEW PLEASE SKILLS (TREAT PHYSICAL ILLNESS, BALANCE EATING, AVOID MOOD-ALTERING SUBSTANCES, BALANCE SLEEP AND GET EXERCISE) WITH Note: Reviewed  Intervention: Continue cognitive behavioral therapy for Tracey Lester--focusing on cognitive  restructuring and automatic negative thoughts Note: continued   Problem: Reduce overall frequency, intensity, and duration of the anxiety so that daily functioning is not impaired. Goal: LTG: Patient will score less than 5 on the Generalized Anxiety Disorder 7 Scale (GAD-7) Outcome: Not Progressing    ProgressTowards Goals: Not Progressing  Interventions: CBT, DBT, and Motivational Interviewing  Summary: Tracey Lester is a 51 y.o. female who presents with improving symptoms related to mood and anxiety.   Allowed pt to explore and express thoughts and feelings associated with recent life situations and external stressors. Tracey Lester reports that her most recent external stressor is thoughts and feelings associated with the inability to focus and concentrate, and increasing depression symptoms. 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 Patient reports that she understands the need to come off of ambien and Adderall for CDIOP reasons, and she has been compliant with this. Patient reports that her stress levels have been high, her depression levels have been high, and that she just does not feel good. Patient made the statement "I'm doing my coping skills, I'm doing my mindfulness, I'm doing my yoga but I feel like I'm just going through the motions, and then it's not really helping me at all". Reviewed coping skills with patient, and recommended different ways of engaging with self during mindfulness and meditation, and discussed other relaxation strategies. Encouraged patient to engage in physical activity and social engagement on a regular basis as interventions to help with depression symptoms. Discussed IOP sessions as a higher level of care, and as a way of getting additional coping skills inpatients toolbox. Assured patient that I  would reach out to our team to see if patient meets criteria. She is aware that her history of substance use could possibly be a barrier.  Discussed current relationship with husband--allow patient safe space to explore her thoughts and feelings associated with the relationship, in the ways that patient is reframing her thoughts and trying to find the positives.   Continued recommendations are as follows: self care behaviors, positive social engagements, focusing on overall work/home/life balance, and focusing on positive physical and emotional wellness.    Suicidal/Homicidal: No   Therapist Response: Pt is continuing to apply interventions learned in session into daily life situations. Pt is currently seeking personal goals to manage symptoms of depression and anxiety/stress. Personal growth and progress noted as intermittent/fluctuating.Treatment to continue as indicated.    Continue to focus on improving self awareness, communication skills, increasing/using coping skills, and life balance  Plan: Return again in 4 weeks. Recommending [facility] to assist in symptom management/further development of coping skills  Encounter Diagnoses  Name Primary?   MDD (major depressive disorder), recurrent, in full remission (HCC) Yes   GAD (generalized anxiety disorder)     Collaboration of Care: Other Pt was given referral to [facility] by Dr. Elna Breslow  Referral made to Mental Health Intensive Outpatient   Patient/Guardian was advised Release of Information must be obtained prior to any record release in order to collaborate their care with an outside provider. Patient/Guardian was advised if they have not already done so to contact the registration department to sign all necessary forms in order for [place] to release information regarding their care.   Consent: Patient/Guardian gives verbal consent for treatment and assignment of benefits for services provided during this visit. Patient/Guardian  expressed understanding and agreed to proceed.   Ernest Haber Imanol Bihl, LCSW 09/07/2022

## 2022-09-07 NOTE — Telephone Encounter (Signed)
Attempted to contact patient to discuss EKG as well as probably starting a medication like doxepin or Elavil, had to leave a voicemail.

## 2022-09-07 NOTE — Telephone Encounter (Signed)
D:  Patient was referred per Christina Hussami, LCSW to [facility].  A:  Placed call and oriented pt.  Informed pt that [facility] is full, but could add pt on a waitlist.  Encouraged pt to verify her insurance benefits.  Pt states she's interested but would like to verify her benefits and discuss with her husband first.  Pt states she would call the case manager back.  Inform Christina.

## 2022-09-08 NOTE — Plan of Care (Signed)
  Problem: Decrease depressive symptoms and improve levels of effective functioning Goal: LTG: Reduce frequency, intensity, and duration of depression symptoms as evidenced by: pt self report Outcome: Not Progressing Goal: STG: [name] @ will participate in at least 80% of scheduled individual psychotherapy sessions Outcome: Progressing Intervention: REVIEW PLEASE SKILLS (TREAT PHYSICAL ILLNESS, BALANCE EATING, AVOID MOOD-ALTERING SUBSTANCES, BALANCE SLEEP AND GET EXERCISE) WITH Note: Reviewed  Intervention: Continue cognitive behavioral therapy for Tracey Lester--focusing on cognitive restructuring and automatic negative thoughts Note: continued   Problem: Reduce overall frequency, intensity, and duration of the anxiety so that daily functioning is not impaired. Goal: LTG: Patient will score less than 5 on the Generalized Anxiety Disorder 7 Scale (GAD-7) Outcome: Not Progressing

## 2022-09-11 ENCOUNTER — Telehealth: Payer: Self-pay | Admitting: Psychiatry

## 2022-09-11 DIAGNOSIS — F3342 Major depressive disorder, recurrent, in full remission: Secondary | ICD-10-CM

## 2022-09-11 MED ORDER — AMITRIPTYLINE HCL 25 MG PO TABS: 25.0000 mg | ORAL_TABLET | Freq: Every day | ORAL | 1 refills | Status: DC

## 2022-09-11 MED ORDER — SERTRALINE HCL 100 MG PO TABS: 150.0000 mg | ORAL_TABLET | Freq: Every day | ORAL | 0 refills | Status: DC

## 2022-09-11 NOTE — Telephone Encounter (Signed)
Attempted to contact patient again, had to leave a voicemail.

## 2022-09-11 NOTE — Telephone Encounter (Signed)
Contacted patient back, reduce sertraline to 150 mg, start Elavil 25 mg at bedtime.  Discussed EKG with patient.  Patient advised to attend IOP program to which she was referred to.

## 2022-09-14 ENCOUNTER — Telehealth (INDEPENDENT_AMBULATORY_CARE_PROVIDER_SITE_OTHER): Payer: 59 | Admitting: Licensed Clinical Social Worker

## 2022-09-14 ENCOUNTER — Telehealth (HOSPITAL_COMMUNITY): Payer: Self-pay | Admitting: Licensed Clinical Social Worker

## 2022-09-14 NOTE — Telephone Encounter (Signed)
LMVM that pt would not be able to attend IOP. Per Velva Harman, IOP team met and felt that pt needs to be off Kratom 100% before she can attend.   Instructed pt to call back with any questions/concerns.

## 2022-09-18 NOTE — BH Specialist Note (Signed)
Error/disregard

## 2022-09-18 NOTE — [facility] Counselor (Signed)
error 

## 2022-09-22 ENCOUNTER — Telehealth (INDEPENDENT_AMBULATORY_CARE_PROVIDER_SITE_OTHER): Payer: 59 | Admitting: Psychiatry

## 2022-09-22 ENCOUNTER — Encounter: Payer: Self-pay | Admitting: Psychiatry

## 2022-09-22 DIAGNOSIS — G4701 Insomnia due to medical condition: Secondary | ICD-10-CM | POA: Diagnosis not present

## 2022-09-22 DIAGNOSIS — F1920 Other psychoactive substance dependence, uncomplicated: Secondary | ICD-10-CM

## 2022-09-22 DIAGNOSIS — F411 Generalized anxiety disorder: Secondary | ICD-10-CM | POA: Diagnosis not present

## 2022-09-22 DIAGNOSIS — F1011 Alcohol abuse, in remission: Secondary | ICD-10-CM

## 2022-09-22 DIAGNOSIS — F3342 Major depressive disorder, recurrent, in full remission: Secondary | ICD-10-CM

## 2022-09-22 DIAGNOSIS — F908 Attention-deficit hyperactivity disorder, other type: Secondary | ICD-10-CM

## 2022-09-22 MED ORDER — LAMOTRIGINE 100 MG PO TABS: 100.0000 mg | ORAL_TABLET | Freq: Every day | ORAL | 1 refills | Status: DC

## 2022-09-22 NOTE — Progress Notes (Signed)
Virtual Visit via Video Note  I connected with Tracey Lester on 09/22/22 at  9:00 AM EST by a video enabled telemedicine application and verified that I am speaking with the correct person using two identifiers.  Location Provider Location : [facility] Patient Location : Home  Participants: Patient , Provider    I discussed the limitations of evaluation and management by telemedicine and the availability of in person appointments. The patient expressed understanding and agreed to proceed.    I discussed the assessment and treatment plan with the patient. The patient was provided an opportunity to ask questions and all were answered. The patient agreed with the plan and demonstrated an understanding of the instructions.   The patient was advised to call back or seek an in-person evaluation if the symptoms worsen or if the condition fails to improve as anticipated.  BH MD OP Progress Note  09/22/2022 3:54 PM Anntonette Madewell  MRN:  [id number]  Chief Complaint:  Chief Complaint  Patient presents with   Follow-up   Medication Refill   Anxiety   Depression   Drug Problem   HPI: Tracey Lester is a 51 year old female, married, lives in [place], [place] mom, has a history of GAD, MDD, ADHD, alcoholism in remission, kratom abuse was evaluated by telemedicine today.  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  Patient reports she is currently on 2-0.5 mg of Suboxone, has been taking it since the past few days.  Patient reports she completely stopped the kratom 3 days ago.  Currently happy that she does not have any withdrawal symptoms, irritability, mood swings like she had before.  The Suboxone has helped a lot.  She has upcoming appointment with her provider today.  May have to follow up with her provider on a weekly basis.  Patient otherwise reports mood symptoms as improving.  Denies  any significant anxiety or depression symptoms.  Continues to have concentration problems however reports that is also improving now that she is managing her withdrawal symptoms from the kratom better.  She reports she feels more alert, aware and she likes that feeling.  She does have sleep issues , due to anxiety as well as pain.  The amitriptyline helps to some extent.  Patient denies any suicidality, homicidality or perceptual disturbances.  Patient denies any other concerns today.  Visit Diagnosis:    ICD-10-CM   1. GAD (generalized anxiety disorder)  F41.1     2. MDD (major depressive disorder), recurrent, in full remission (HCC)  F33.42 lamoTRIgine (LAMICTAL) 100 MG tablet    3. Insomnia due to medical condition  G47.01    pain, mood    4. Attention deficit hyperactivity disorder (ADHD), other type  F90.8     5. Other psychoactive substance dependence, uncomplicated (HCC)  F19.20    kratom    6. History of alcohol abuse  F10.11       Past Psychiatric History: Reviewed past psychiatric history from progress note on 01/06/2018.  Previous neuropsychological testing completed by previous psychiatrist-patient agreed to sign an ROI.  If unable to get that she will need repeat neuropsychological testing for diagnostic clarification of her previous diagnosis of ADHD.  This was already discussed with patient.  Past Medical History:  Past Medical History:  Diagnosis Date   ADHD (attention deficit hyperactivity disorder)    Anxiety    Depression    Herpes     Past  Surgical History:  Procedure Laterality Date   TONSILLECTOMY      Family Psychiatric History: Reviewed family psychiatric history from progress note on 01/06/2018.  Family History:  Family History  Problem Relation Age of Onset   Anxiety disorder Mother    Depression Mother    Hypertension Mother    Anxiety disorder Father    Depression Father    Hypertension Father    Schizophrenia Paternal Grandmother     Depression Paternal Grandmother    Hearing loss Paternal Grandmother    Hypertension Paternal Grandmother    Depression Maternal Grandmother     Social History: Reviewed social history from progress note on 01/06/2018. Social History   Socioeconomic History   Marital status: Married    Spouse name: michael   Number of children: 3   Years of education: Not on file   Highest education level: Associate degree: occupational, Scientist, product/process development, or vocational program  Occupational History   Not on file  Tobacco Use   Smoking status: Former    Types: Cigarettes    Quit date: 10/08/2006    Years since quitting: 15.9   Smokeless tobacco: Never  Vaping Use   Vaping Use: Never used  Substance and Sexual Activity   Alcohol use: Yes    Alcohol/week: 5.0 standard drinks of alcohol    Types: 4 Glasses of wine, 1 Cans of beer per week   Drug use: No   Sexual activity: Yes    Birth control/protection: None  Other Topics Concern   Not on file  Social History Narrative   Not on file   Social Determinants of Health   Financial Resource Strain: Low Risk  (10/08/2017)   Overall Financial Resource Strain (CARDIA)    Difficulty of Paying Living Expenses: Not hard at all  Food Insecurity: No Food Insecurity (10/08/2017)   Hunger Vital Sign    Worried About Running Out of Food in the Last Year: Never true    Ran Out of Food in the Last Year: Never true  Transportation Needs: No Transportation Needs (10/08/2017)   PRAPARE - Administrator, Civil Service (Medical): No    Lack of Transportation (Non-Medical): No  Physical Activity: Unknown (10/08/2017)   Exercise Vital Sign    Days of Exercise per Week: 4 days    Minutes of Exercise per Session: Not on file  Stress: Stress Concern Present (10/08/2017)   [employer] of Occupational Health - Occupational Stress Questionnaire    Feeling of Stress : Very much  Social Connections: Moderately Integrated (10/08/2017)   Social Connection  and Isolation Panel [NHANES]    Frequency of Communication with Friends and Family: More than three times a week    Frequency of Social Gatherings with Friends and Family: More than three times a week    Attends Religious Services: More than 4 times per year    Active Member of [employer] or Organizations: No    Attends Banker Meetings: Never    Marital Status: Married    Allergies:  Allergies  Allergen Reactions   Codeine Itching    Metabolic Disorder Labs: Lab Results  Component Value Date   HGBA1C 5.3 02/21/2021   MPG 105.41 02/21/2021   Lab Results  Component Value Date   PROLACTIN 19.6 02/21/2021   Lab Results  Component Value Date   CHOL 170 02/21/2021   TRIG 68 02/21/2021   HDL 69 02/21/2021   CHOLHDL 2.5 02/21/2021   VLDL 14 02/21/2021  LDLCALC 87 02/21/2021   Lab Results  Component Value Date   TSH 1.198 02/21/2021   TSH 2.59 08/09/2018    Therapeutic Level Labs: No results found for: "LITHIUM" No results found for: "VALPROATE" No results found for: "CBMZ"  Current Medications: Current Outpatient Medications  Medication Sig Dispense Refill   amitriptyline (ELAVIL) 25 MG tablet Take 1 tablet (25 mg total) by mouth at bedtime. 30 tablet 1   buprenorphine-naloxone (SUBOXONE) 2-0.5 mg SUBL SL tablet Place 1 tablet under the tongue daily.     sertraline (ZOLOFT) 100 MG tablet Take 1.5 tablets (150 mg total) by mouth daily. 135 tablet 0   valACYclovir (VALTREX) 500 MG tablet Take 1 tablet (500 mg total) by mouth 2 (two) times daily. 6 tablet 0   lamoTRIgine (LAMICTAL) 100 MG tablet Take 1 tablet (100 mg total) by mouth daily. 90 tablet 1   No current facility-administered medications for this visit.     Musculoskeletal: Strength & Muscle Tone:  UTA Gait & Station:  Seated Patient leans: N/A  Psychiatric Specialty Exam: Review of Systems  Musculoskeletal:  Positive for back pain (chronic - improving).  Psychiatric/Behavioral:  Positive for  decreased concentration and sleep disturbance. The patient is nervous/anxious.   All other systems reviewed and are negative.   There were no vitals taken for this visit.There is no height or weight on file to calculate BMI.  General Appearance: Casual  Eye Contact:  Fair  Speech:  Clear and Coherent  Volume:  Normal  Mood:  Anxious improving  Affect:  Congruent  Thought Process:  Goal Directed and Descriptions of Associations: Intact  Orientation:  Full (Time, Place, and Person)  Thought Content: Logical   Suicidal Thoughts:  No  Homicidal Thoughts:  No  Memory:  Immediate;   Fair Recent;   Fair Remote;   Fair  Judgement:  Fair  Insight:  Fair  Psychomotor Activity:  Normal  Concentration:  Concentration: Fair and Attention Span: Fair  Recall:  [employer] of Knowledge: Fair  Language: Fair  Akathisia:  No  Handed:  Right  AIMS (if indicated): not done  Assets:  Communication Skills Desire for Improvement Housing Social Support Talents/Skills Transportation  ADL's:  Intact  Cognition: WNL  Sleep:   restless at times   Screenings: AIMS    Flowsheet Row Office Visit from 04/21/2022 in [facility]  AIMS Total Score 0      GAD-7    Flowsheet Row Office Visit from 09/03/2022 in [facility] Office Visit from 07/21/2022 in [facility] Office Visit from 04/21/2022 in [facility] Office Visit from 10/31/2021 in [facility] Video Visit from 05/16/2021 in [facility]  Total GAD-7 Score 3 0 0 1 1      PHQ2-9    Flowsheet Row Office Visit from 09/03/2022 in [facility] Office Visit from 07/21/2022 in [facility] Office Visit from 04/21/2022 in [facility] Counselor from 03/24/2022 in [facility] Video  Visit from 01/20/2022 in [facility]  PHQ-2 Total Score 0 0 0 0 0  PHQ-9 Total Score 7 3 1  -- --      Flowsheet Row Video Visit from 09/22/2022 in [facility] Counselor from 09/07/2022 in [facility] [place] Office Visit from 09/03/2022 in [facility]  C-SSRS RISK CATEGORY No Risk No Risk No Risk  Assessment and Plan: Tracey AranShirley Emmitt is a 51 year old Caucasian female who has a history of MDD, insomnia, anxiety, ADD was evaluated by telemedicine today.  Patient is currently on Suboxone, [facility] currently reports mood symptoms as improving.  Plan as noted below.  Plan MDD in full remission Lamotrigine 100 mg p.o. daily Zoloft 150 mg p.o. daily  GAD-improving Zoloft 150 mg p.o. daily Continue CBT   Insomnia-some improvement She will need sufficient pain management.  She is also on Suboxone currently which helps to some extent. Continue Elavil 25 mg p.o. nightly  Kratom use disorder-improving Patient is [facility]one treatment. Will coordinate care.  Will request medical records. Discussed with patient drug to drug interaction between Suboxone, Zoloft and Elavil including determination, serotonin syndrome,coma,death.  ADHD per history-concerns about whether patient truly has ADHD, concerns of bipolar disorder as well as substance induced concentration problems.  Patient was advised last visit to sign an ROI to obtain neuropsychological testing from previous [place] in [place]. However referred her to neuropsychologist-Dr. Bryson DamesSteven Altabet for testing.  Follow-up in clinic in 3 months or sooner if needed since patient is currently [facility] the [facility] of [facility] and has the need for frequent sessions with them for Suboxone treatment.   Collaboration of Care: Collaboration of Care: Referral or  follow-up with counselor/therapist AEB patient advised to continue to follow-up with therapist.  I have coordinated care.  Patient/Guardian was advised Release of Information must be obtained prior to any record release in order to collaborate their care with an outside provider. Patient/Guardian was advised if they have not already done so to contact the registration department to sign all necessary forms in order for us to release information regarding their care.   Consent: Patient/Guardian gives verbal consent for treatment and assignment of benefits for services provided during this visit. Patient/Guardian expressed understanding and agreed to proceed.    Jomarie LongsSaramma Fahmida Jurich, MD 09/22/2022, 3:54 PM

## 2022-10-08 ENCOUNTER — Ambulatory Visit (INDEPENDENT_AMBULATORY_CARE_PROVIDER_SITE_OTHER): Payer: 59 | Admitting: Licensed Clinical Social Worker

## 2022-10-08 DIAGNOSIS — F3342 Major depressive disorder, recurrent, in full remission: Secondary | ICD-10-CM

## 2022-10-08 DIAGNOSIS — F1920 Other psychoactive substance dependence, uncomplicated: Secondary | ICD-10-CM

## 2022-10-08 NOTE — Progress Notes (Signed)
Virtual Visit via Video Note  I connected with Tracey Lester on 10/08/22 at  1:00 PM EST by a video enabled telemedicine application and verified that I am speaking with the correct person using two identifiers.   Location: Patient: home Provider: remote office [place], [place])   I discussed the limitations of evaluation and management by telemedicine and the availability of in person appointments. The patient expressed understanding and agreed to proceed.   I discussed the assessment and treatment plan with the patient. The patient was provided an opportunity to ask questions and all were answered. The patient agreed with the plan and demonstrated an understanding of the instructions.   The patient was advised to call back or seek an in-person evaluation if the symptoms worsen or if the condition fails to improve as anticipated.  I provided  60 minutes of non-face-to-face time during this encounter.   Dashauna Heymann R Astou Lada, LCSW  THERAPIST PROGRESS NOTE  Session Time: 1-2p  Participation Level: Active  Behavioral Response: CasualAlertAnxious, Depressed, and motivated  Type of Therapy: Individual Therapy  Treatment Goals addressed:   ProgressTowards Goals: Progressing  Interventions: CBT, DBT, and Motivational Interviewing  Summary: Tracey Lester is a 51 y.o. female who presents with improving symptoms related to mood and anxiety.   Allowed pt to explore and express thoughts and feelings associated with recent life situations and external stressors. Pt reports that she is very happy that she has made the decision to "throw away" her kratom. Pt is currently seeing Patriciaann Clan at [facility] and is currently taking suboxone to medically manage withdrawal symptoms since discontinuing kratom.   Patient reports that she has made the decision to use medication assisted treatment for discontinuing kratom use. Patient is currently taking suboxone managed [facility]. Patient reports that since going on the suboxone she feels more focused, has more clarity about goals, and has increased levels of self-awareness.Patient reports that she is concerned about how kratom leaves the body, and that she is continuing to test positive for it, but is not using the kratom. Patient reports that she feels that her provider does not believe her, but she is very aware of herself and her own behaviors. Patient sees her progress and is happy to move forward to the next phase of treatment.  Discussed several options as far as intensive outpatient services go, and suggested several routes for patient. Patient feels that she would like to go through the chemically dependent IOP groups, and then at the end of those sessions we'll see if she needs to transition to the mental health IOP groups. Patient Reports that she is eager to get started.   Patient reports that her husband has been extremely supportive throughout--patient has been very open and honest with him about her struggles.  Reviewed coping skills for managing self, and use motivational interviewing to support patients continuing sobriety.  Pt reports that she feels improved sense of self, confused at times, and is more able to trust the process of healing.   Continued recommendations are as follows: self care behaviors, positive social engagements, focusing on overall work/home/life balance, and focusing on positive physical and emotional wellness.    Suicidal/Homicidal: No   Therapist Response: Pt is continuing to apply interventions learned in session into daily life situations. Pt is currently seeking personal goals to manage symptoms of depression and anxiety/stress. Personal growth and progress noted as intermittent/fluctuating.Treatment to continue as indicated.    Continue to  focus on improving self awareness, communication skills, increasing/using coping skills, and life balance  Plan:  Return again in 4 weeks. Recommending CDIOP.   Encounter Diagnoses  Name Primary?   MDD (major depressive disorder), recurrent, in full remission ([place]) Yes   Other psychoactive substance dependence, uncomplicated ([place])     Collaboration of Care: Other Pt to continue services with current provider, Patriciaann Clan PA. Pt also referred to [place] for CDIOP through [facility].  Patient/Guardian was advised Release of Information must be obtained prior to any record release in order to collaborate their care with an outside provider. Patient/Guardian was advised if they have not already done so to contact the registration department to sign all necessary forms in order for [place] to release information regarding their care.   Consent: Patient/Guardian gives verbal consent for treatment and assignment of benefits for services provided during this visit. Patient/Guardian expressed understanding and agreed to proceed.   Active     Accept the fact of chemical dependence and begin to actively participate and utilize behavioral strategies and cognitive coping skills to help maintain harm reduction behaviors     STG: Promote adherence to treatment regimen (Not Applicable)     Start:  02/03/22    Expected End:  05/25/22    Resolved:  02/24/22    Goal Note     Unknown for this session         Perform motivational interviewing regarding kratom use     Start:  02/03/22          Intervention Note     Pt currently discontinued kratom use           Decrease depressive symptoms and improve levels of effective functioning     LTG: Reduce frequency, intensity, and duration of depression symptoms as evidenced by: pt self report (Progressing)     Start:  07/16/21    Expected End:  12/24/22         STG: [name] @ will participate in at least 80% of scheduled individual psychotherapy sessions (Progressing)     Start:  07/16/21    Expected End:  12/24/22         REVIEW PLEASE  SKILLS (TREAT PHYSICAL ILLNESS, BALANCE EATING, AVOID MOOD-ALTERING SUBSTANCES, BALANCE SLEEP AND GET EXERCISE) WITH Rubylee     Start:  12/22/21          Intervention Note     Reviewed          Continue cognitive behavioral therapy for Morgan--focusing on cognitive restructuring and automatic negative thoughts     Start:  12/22/21          Intervention Note     continued           Reduce overall frequency, intensity, and duration of the anxiety so that daily functioning is not impaired.     LTG: Patient will score less than 5 on the Generalized Anxiety Disorder 7 Scale (GAD-7) (Progressing)     Start:  07/16/21    Expected End:  12/24/22         Assist with relaxation techniques, as appropriate (deep breathing exercises, meditation, guided imagery)     Start:  12/22/21          Intervention Note     Reviewed          Manage signs of labile or escalating emotions     Start:  12/22/21          Intervention Note  Reviewed          Encourage self-care activities     Start:  12/22/21          Intervention Note     Continued to encourage         Assist with relaxation techniques, as appropriate (deep breathing exercises, meditation, guided imagery)     Start:  12/22/21          Intervention Note     Reviewed           Resolved     Substance Use Disorder CCP Problem  1 Accept the fact of chemical dependence and begin to actively participate and utilize behavioral strategies and cognitive coping skills to help maintain sobriety 3 out of 5 sessions documented     LTG: [place] WILL INCREASE COPING SKILLS TO PROMOTE LONG-TERM RECOVERY AND IMPROVE ABILITY TO PERFORM DAILY ACTIVITIES (Not Applicable)     Start:  12/01/21    Expected End:  02/19/22    Resolved:  12/22/21    Goal Note     Did not discuss at session         STG: Promote adherence to treatment regimen (Not Applicable)     Start:  12/01/21    Expected End:  02/19/22     Resolved:  12/22/21        Aviv Lengacher R Cobe Viney, LCSW 10/08/2022

## 2022-10-12 ENCOUNTER — Telehealth (HOSPITAL_COMMUNITY): Payer: Self-pay | Admitting: Licensed Clinical Social Worker

## 2022-10-12 NOTE — Telephone Encounter (Signed)
The therapist calls Berneita back confirming her identity via two identifiers. She says that she has been off Adderall and Ambien for about 2 months and has not used Kratom in going on 2 weeks. She did not use for 2 weeks and then had a slip before this most recent period of sobriety after which she threw the Kratom away.   She says that her depression and anxiety is "at bay" as she is able to get out of bed but crying a lot  and feels overwhelmed. She now has a "lot of free time without napping" and is experiencing pain that she has not experienced in years which was "buried" by the Kratom use. She notes that she is confused about what her goal is.   The therapist answers her questions about the IOP and [place] is to call her insurance to see how the program is covered and how much she can expect to pay and call this therapist back tomorrow having his direct number.   Myrna Blazer, MA, LCSW, [facility], LCAS 10/12/2022

## 2022-10-15 ENCOUNTER — Telehealth (HOSPITAL_COMMUNITY): Payer: Self-pay | Admitting: Licensed Clinical Social Worker

## 2022-10-15 NOTE — Telephone Encounter (Signed)
The therapist attempts to reach [facility] in response to her message from yesterday about wanting to start SA IOP. The therapist leaves a HIPAA-compliant voicemail with his direct callback number.  Myrna Blazer, MA, LCSW, [facility], LCAS 10/15/2022

## 2022-10-16 ENCOUNTER — Telehealth (HOSPITAL_COMMUNITY): Payer: Self-pay | Admitting: Licensed Clinical Social Worker

## 2022-10-16 NOTE — Telephone Encounter (Signed)
The therapist returns Tracey Lester's call leaving a HIPAA-compliant voicemail informing her that the program is per diem and that there is no way to advance bill so if her deductible restarts at the beginning of the year, then she will have to meet it once more. The therapist also lets her know that if she wants to start the program that he would likely need for her to come into the office briefly on 10/20/22 if she were to start on the 27th. He leaves his direct contact number.  Myrna Blazer, MA, LCSW, [facility], LCAS 10/16/2022

## 2022-10-22 ENCOUNTER — Telehealth (HOSPITAL_COMMUNITY): Payer: Self-pay | Admitting: Licensed Clinical Social Worker

## 2022-10-22 ENCOUNTER — Ambulatory Visit: Payer: Managed Care, Other (non HMO) | Admitting: Internal Medicine

## 2022-10-22 NOTE — Telephone Encounter (Signed)
The therapist returns Tracey Lester's call confirming her identity via two identifiers. She wants to start SA IOP tomorrow so the therapist says that she could come in at 8:30 a.m. for a UDS and to do a Treatment Plan and start after this.  She denies any use of controlled substances in over two months and no additional use of Kratom since her last use around the 4th of this month.  Myrna Blazer, MA, LCSW, [facility], LCAS 10/22/2022

## 2022-10-23 ENCOUNTER — Other Ambulatory Visit (HOSPITAL_COMMUNITY): Payer: 59 | Attending: Psychiatry | Admitting: Licensed Clinical Social Worker

## 2022-10-23 ENCOUNTER — Ambulatory Visit (INDEPENDENT_AMBULATORY_CARE_PROVIDER_SITE_OTHER): Payer: Managed Care, Other (non HMO) | Admitting: Internal Medicine

## 2022-10-23 ENCOUNTER — Encounter: Payer: Self-pay | Admitting: Internal Medicine

## 2022-10-23 VITALS — BP 122/78 | HR 113 | Temp 97.8°F | Resp 16 | Ht 70.0 in | Wt 166.8 lb

## 2022-10-23 DIAGNOSIS — Z114 Encounter for screening for human immunodeficiency virus [HIV]: Secondary | ICD-10-CM

## 2022-10-23 DIAGNOSIS — F1920 Other psychoactive substance dependence, uncomplicated: Secondary | ICD-10-CM

## 2022-10-23 DIAGNOSIS — F411 Generalized anxiety disorder: Secondary | ICD-10-CM | POA: Diagnosis not present

## 2022-10-23 DIAGNOSIS — Z23 Encounter for immunization: Secondary | ICD-10-CM | POA: Diagnosis not present

## 2022-10-23 DIAGNOSIS — D649 Anemia, unspecified: Secondary | ICD-10-CM | POA: Diagnosis not present

## 2022-10-23 DIAGNOSIS — Z1231 Encounter for screening mammogram for malignant neoplasm of breast: Secondary | ICD-10-CM

## 2022-10-23 DIAGNOSIS — Z8619 Personal history of other infectious and parasitic diseases: Secondary | ICD-10-CM

## 2022-10-23 DIAGNOSIS — Z79899 Other long term (current) drug therapy: Secondary | ICD-10-CM

## 2022-10-23 DIAGNOSIS — R718 Other abnormality of red blood cells: Secondary | ICD-10-CM

## 2022-10-23 DIAGNOSIS — F3181 Bipolar II disorder: Secondary | ICD-10-CM

## 2022-10-23 DIAGNOSIS — Z13220 Encounter for screening for lipoid disorders: Secondary | ICD-10-CM

## 2022-10-23 DIAGNOSIS — Z1211 Encounter for screening for malignant neoplasm of colon: Secondary | ICD-10-CM

## 2022-10-23 DIAGNOSIS — Z1159 Encounter for screening for other viral diseases: Secondary | ICD-10-CM

## 2022-10-23 MED ORDER — VALACYCLOVIR HCL 500 MG PO TABS: 500.0000 mg | ORAL_TABLET | Freq: Every day | ORAL | 0 refills | Status: DC | PRN

## 2022-10-23 NOTE — Progress Notes (Signed)
Daily Group Progress Note   Program: [facility] IOP     Individual Time: 9 a.m. to 12 p.m.    Type of Therapy: Process and Psychoeducational    Topic: The therapist checks in with group members, assesses for SI/HI/psychosis and overall level of functioning. The therapist inquires about sobriety date and number of community support meetings attended since last session.    The therapist introduces a new group member and spends a great deal of time educating group members on how [place] and NA work and answering questions about the program. The therapist also shows group members [employer] and how to use it to find a meeting. The therapist educates group members on stage of change and answers questions concerning how and why it would be determined that a client needs to be referred to a higher level of care.    Summary: Tracey Lester presents rating her depression as a "0" and anxiety as an "8."    She is tearful in describing her mood as "overwhelmed" and "hopeful." She admits that she is not able to expand on the reason that she feels overwhelmed as she is too emotional; however, she notes that she is hopeful in starting [facility] IOP.  Tracey Lester asks numerous good questions about the Twelve Step program and how it works. She admits that she has attempted to attend meetings in the past getting as far as the parking lot but never being able to go in. The therapist notes that now that she has met some women in this group that she could perhaps go with them to the in person meeting they attend on Thursdays.   Tracey Lester also talks about having stop using for brief periods in the past saying that she never kept track of how many days she was sober noting that a therapist with whom she met in the past said that she likely did not do so as it was a defense.   The therapist and another group member explain that keeping track of one's time is a means of tracking progress. The therapist also explains to Tracey Lester why the program involves  more than not just using illustrating the difference between a person in recovery and a "dry drunk."   The therapist and other group members congratulate Tracey Lester on having had the courage to come to group today and encourage her to return.    Progress Towards Goals: Tracey Lester makes today  her sobriety date as she has been using Delta 8   UDS collected: Yes Results: Yes; positive for THC due to using Delta-8   AA/NA attended?: No   Sponsor?: No   Adam Phenix, MA, LCSW, [facility], LCAS 10/23/2022

## 2022-10-23 NOTE — Progress Notes (Signed)
New Patient Office Visit  Subjective    Patient ID: Tracey Lester, female    DOB: [birth year]  Age: 51 y.o. MRN: [id number]  CC:  Chief Complaint  Patient presents with   Establish Care   Back Pain    And hips    HPI Tracey Lester presents to establish care.  MDD/GAD/ADHD/Hx of AUD: -Following with Psychiatry, Dr. Elna Breslow and [facility], last seen on 09/03/22 -Currently on Lamictal 100 mg, Zoloft 150 mg (just decreased recently), Amitriptyline 25 mg at night for sleep - EKG reviewed 09/04/22. -Participating in counseling - now on Suboxone 2-0.5 mg. Had been using kratom previously, last dose about 1 month ago.  History of Genital Herpes: -Uses Valtrex 500 mg as needed -Last flare in 2019, no current symptoms   Health Maintenance: -Blood work due -Colon cancer screening due  -Pap due -Mammogram due -Tdap due  Outpatient Encounter Medications as of 10/23/2022  Medication Sig   amitriptyline (ELAVIL) 25 MG tablet Take 1 tablet (25 mg total) by mouth at bedtime.   buprenorphine-naloxone (SUBOXONE) 2-0.5 mg SUBL SL tablet Place 1 tablet under the tongue daily.   lamoTRIgine (LAMICTAL) 100 MG tablet Take 1 tablet (100 mg total) by mouth daily.   sertraline (ZOLOFT) 100 MG tablet Take 1.5 tablets (150 mg total) by mouth daily.   [DISCONTINUED] valACYclovir (VALTREX) 500 MG tablet Take 1 tablet (500 mg total) by mouth 2 (two) times daily. (Patient taking differently: Take 500 mg by mouth daily as needed.)   valACYclovir (VALTREX) 500 MG tablet Take 1 tablet (500 mg total) by mouth daily as needed.   No facility-administered encounter medications on file as of 10/23/2022.    Past Medical History:  Diagnosis Date   ADHD (attention deficit hyperactivity disorder)    Anxiety    Depression    Herpes     Past Surgical History:  Procedure Laterality Date   TONSILLECTOMY      Family History  Problem Relation Age of Onset   Anxiety disorder Mother    Depression  Mother    Hypertension Mother    Anxiety disorder Father    Depression Father    Hypertension Father    Schizophrenia Paternal Grandmother    Depression Paternal Grandmother    Hearing loss Paternal Grandmother    Hypertension Paternal Grandmother    Depression Maternal Grandmother     Social History   Socioeconomic History   Marital status: Married    Spouse name: michael   Number of children: 3   Years of education: Not on file   Highest education level: Associate degree: occupational, Scientist, product/process development, or vocational program  Occupational History   Not on file  Tobacco Use   Smoking status: Former    Types: Cigarettes    Quit date: 10/08/2006    Years since quitting: 16.0   Smokeless tobacco: Never  Vaping Use   Vaping Use: Never used  Substance and Sexual Activity   Alcohol use: Not Currently    Alcohol/week: 5.0 standard drinks of alcohol    Types: 4 Glasses of wine, 1 Cans of beer per week   Drug use: No   Sexual activity: Yes    Birth control/protection: None  Other Topics Concern   Not on file  Social History Narrative   Not on file   Social Determinants of Health   Financial Resource Strain: Low Risk  (10/08/2017)   Overall Financial Resource Strain (CARDIA)    Difficulty of Paying Living Expenses: Not hard  at all  Food Insecurity: No Food Insecurity (10/08/2017)   Hunger Vital Sign    Worried About Running Out of Food in the Last Year: Never true    Ran Out of Food in the Last Year: Never true  Transportation Needs: No Transportation Needs (10/08/2017)   PRAPARE - Administrator, Civil Service (Medical): No    Lack of Transportation (Non-Medical): No  Physical Activity: Unknown (10/08/2017)   Exercise Vital Sign    Days of Exercise per Week: 4 days    Minutes of Exercise per Session: Not on file  Stress: Stress Concern Present (10/08/2017)   [employer] of Occupational Health - Occupational Stress Questionnaire    Feeling of Stress :  Very much  Social Connections: Moderately Integrated (10/08/2017)   Social Connection and Isolation Panel [NHANES]    Frequency of Communication with Friends and Family: More than three times a week    Frequency of Social Gatherings with Friends and Family: More than three times a week    Attends Religious Services: More than 4 times per year    Active Member of [employer] or Organizations: No    Attends Banker Meetings: Never    Marital Status: Married  Catering manager Violence: Not At Risk (10/08/2017)   Humiliation, Afraid, Rape, and Kick questionnaire    Fear of Current or Ex-Partner: No    Emotionally Abused: No    Physically Abused: No    Sexually Abused: No    Review of Systems  All other systems reviewed and are negative.       Objective    BP 122/78   Pulse (!) 113   Temp 97.8 F (36.6 C)   Resp 16   Ht 5\' 10"  (1.778 m)   Wt 166 lb 12.8 oz (75.7 kg)   LMP 09/27/2022   SpO2 98%   BMI 23.93 kg/m   Physical Exam Constitutional:      Appearance: Normal appearance.  HENT:     Head: Normocephalic and atraumatic.  Eyes:     Conjunctiva/sclera: Conjunctivae normal.  Cardiovascular:     Rate and Rhythm: Normal rate and regular rhythm.  Pulmonary:     Effort: Pulmonary effort is normal.     Breath sounds: Normal breath sounds.  Musculoskeletal:     Right lower leg: No edema.     Left lower leg: No edema.  Skin:    General: Skin is warm and dry.  Neurological:     General: No focal deficit present.     Mental Status: She is alert. Mental status is at baseline.  Psychiatric:        Mood and Affect: Mood normal.        Behavior: Behavior normal.         Assessment & Plan:   1. GAD (generalized anxiety disorder)/High risk medication use: Following with psychiatry, note reviewed from 09/03/2022.  Patient is currently on Lamictal 100 mg, Zoloft 150 mg and amitriptyline 25 mg at night for sleep.  Patient asking if I would be able to prescribe  these medications if needed, which I can do for her.  Patient is also participating in counseling through [facility] which is where she is also getting her Suboxone. Due for screening labs, CBC, CMP today.    - CBC w/Diff/Platelet - COMPLETE METABOLIC PANEL WITH GFR  2. History of herpes genitalis: Stable, no flares since 2019.  Patient does not have Valtrex on hand, will refill today  so she can take at the onset of symptoms.  - valACYclovir (VALTREX) 500 MG tablet; Take 1 tablet (500 mg total) by mouth daily as needed.  Dispense: 30 tablet; Refill: 0  3. Lipid screening: Lipid screening due today. - Lipid Profile  4. Encounter for screening mammogram for malignant neoplasm of breast: Mammogram ordered.  - MM 3D SCREEN BREAST BILATERAL; Future  5. Screening for colon cancer: Referral to GI placed for colonoscopy.  - Ambulatory referral to Gastroenterology  6. Encounter for hepatitis C screening test for low risk patient/Screening for HIV without presence of risk factors: Screening tests ordered.   - Hepatitis C Antibody - HIV antibody (with reflex)  7. Need for diphtheria-tetanus-pertussis (Tdap) vaccine: Tdap administered today.  - Tdap vaccine greater than or equal to 7yo IM  Return in 6 months (on 04/24/2023) for CPE w/ pap.   Margarita Mail, DO

## 2022-10-27 NOTE — Addendum Note (Signed)
Addended by: Teodora Medici on: 10/27/2022 09:39 AM   Modules accepted: Orders

## 2022-10-28 ENCOUNTER — Other Ambulatory Visit (HOSPITAL_COMMUNITY): Payer: 59 | Attending: Psychiatry | Admitting: Medical

## 2022-10-28 ENCOUNTER — Encounter (HOSPITAL_COMMUNITY): Payer: Self-pay | Admitting: Medical

## 2022-10-28 DIAGNOSIS — F4312 Post-traumatic stress disorder, chronic: Secondary | ICD-10-CM

## 2022-10-28 DIAGNOSIS — F411 Generalized anxiety disorder: Secondary | ICD-10-CM | POA: Insufficient documentation

## 2022-10-28 DIAGNOSIS — Z8659 Personal history of other mental and behavioral disorders: Secondary | ICD-10-CM | POA: Insufficient documentation

## 2022-10-28 DIAGNOSIS — F410 Panic disorder [episodic paroxysmal anxiety] without agoraphobia: Secondary | ICD-10-CM | POA: Diagnosis not present

## 2022-10-28 DIAGNOSIS — M545 Low back pain, unspecified: Secondary | ICD-10-CM | POA: Diagnosis not present

## 2022-10-28 DIAGNOSIS — F900 Attention-deficit hyperactivity disorder, predominantly inattentive type: Secondary | ICD-10-CM | POA: Diagnosis not present

## 2022-10-28 DIAGNOSIS — F1920 Other psychoactive substance dependence, uncomplicated: Secondary | ICD-10-CM | POA: Diagnosis not present

## 2022-10-28 DIAGNOSIS — F1021 Alcohol dependence, in remission: Secondary | ICD-10-CM

## 2022-10-28 DIAGNOSIS — F3181 Bipolar II disorder: Secondary | ICD-10-CM | POA: Diagnosis not present

## 2022-10-28 DIAGNOSIS — R198 Other specified symptoms and signs involving the digestive system and abdomen: Secondary | ICD-10-CM | POA: Insufficient documentation

## 2022-10-28 DIAGNOSIS — F341 Dysthymic disorder: Secondary | ICD-10-CM | POA: Insufficient documentation

## 2022-10-28 DIAGNOSIS — F418 Other specified anxiety disorders: Secondary | ICD-10-CM | POA: Diagnosis not present

## 2022-10-28 DIAGNOSIS — G8929 Other chronic pain: Secondary | ICD-10-CM | POA: Insufficient documentation

## 2022-10-28 DIAGNOSIS — F1221 Cannabis dependence, in remission: Secondary | ICD-10-CM | POA: Diagnosis present

## 2022-10-28 DIAGNOSIS — F5105 Insomnia due to other mental disorder: Secondary | ICD-10-CM | POA: Diagnosis not present

## 2022-10-28 DIAGNOSIS — Z62819 Personal history of unspecified abuse in childhood: Secondary | ICD-10-CM | POA: Insufficient documentation

## 2022-10-28 DIAGNOSIS — Z9189 Other specified personal risk factors, not elsewhere classified: Secondary | ICD-10-CM | POA: Insufficient documentation

## 2022-10-28 LAB — COMPLETE METABOLIC PANEL WITH GFR
AG Ratio: 2 (calc) (ref 1.0–2.5)
ALT: 13 U/L (ref 6–29)
AST: 19 U/L (ref 10–35)
Albumin: 4.2 g/dL (ref 3.6–5.1)
Alkaline phosphatase (APISO): 77 U/L (ref 31–125)
BUN: 13 mg/dL (ref 7–25)
CO2: 28 mmol/L (ref 20–32)
Calcium: 9.6 mg/dL (ref 8.6–10.2)
Chloride: 104 mmol/L (ref 98–110)
Creat: 0.64 mg/dL (ref 0.50–0.99)
Globulin: 2.1 g/dL (calc) (ref 1.9–3.7)
Glucose, Bld: 78 mg/dL (ref 65–99)
Potassium: 4 mmol/L (ref 3.5–5.3)
Sodium: 139 mmol/L (ref 135–146)
Total Bilirubin: 0.3 mg/dL (ref 0.2–1.2)
Total Protein: 6.3 g/dL (ref 6.1–8.1)
eGFR: 108 mL/min/{1.73_m2} (ref >=60)

## 2022-10-28 LAB — LIPID PANEL
Cholesterol: 161 mg/dL (ref <200)
HDL: 59 mg/dL (ref >=50)
LDL Cholesterol (Calc): 78 mg/dL (calc)
Non-HDL Cholesterol (Calc): 102 mg/dL (calc) (ref <130)
Total CHOL/HDL Ratio: 2.7 (calc) (ref <5.0)
Triglycerides: 140 mg/dL (ref <150)

## 2022-10-28 LAB — IRON,TIBC AND FERRITIN PANEL
%SAT: 17 % (calc) (ref 16–45)
Ferritin: 3 ng/mL — ABNORMAL LOW (ref 16–232)
Iron: 65 ug/dL (ref 40–190)
TIBC: 375 mcg/dL (calc) (ref 250–450)

## 2022-10-28 LAB — CBC WITH DIFFERENTIAL/PLATELET
Absolute Monocytes: 689 cells/uL (ref 200–950)
Basophils Absolute: 92 cells/uL (ref 0–200)
Basophils Relative: 1.8 %
Eosinophils Absolute: 281 cells/uL (ref 15–500)
Eosinophils Relative: 5.5 %
HCT: 34 % — ABNORMAL LOW (ref 35.0–45.0)
Hemoglobin: 11.3 g/dL — ABNORMAL LOW (ref 11.7–15.5)
Lymphs Abs: 1576 cells/uL (ref 850–3900)
MCH: 26.2 pg — ABNORMAL LOW (ref 27.0–33.0)
MCHC: 33.2 g/dL (ref 32.0–36.0)
MCV: 78.9 fL — ABNORMAL LOW (ref 80.0–100.0)
MPV: 10.1 fL (ref 7.5–12.5)
Monocytes Relative: 13.5 %
Neutro Abs: 2463 cells/uL (ref 1500–7800)
Neutrophils Relative %: 48.3 %
Platelets: 307 10*3/uL (ref 140–400)
RBC: 4.31 10*6/uL (ref 3.80–5.10)
RDW: 17.8 % — ABNORMAL HIGH (ref 11.0–15.0)
Total Lymphocyte: 30.9 %
WBC: 5.1 10*3/uL (ref 3.8–10.8)

## 2022-10-28 LAB — TEST AUTHORIZATION

## 2022-10-28 LAB — HEPATITIS C ANTIBODY: Hepatitis C Ab: NONREACTIVE

## 2022-10-28 LAB — HIV ANTIBODY (ROUTINE TESTING W REFLEX): HIV 1&2 Ab, 4th Generation: NONREACTIVE

## 2022-10-28 MED ORDER — IRON (FERROUS SULFATE) 325 (65 FE) MG PO TABS: 325.0000 mg | ORAL_TABLET | Freq: Every day | ORAL | 3 refills | Status: DC
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
## 2022-10-28 NOTE — Addendum Note (Signed)
Addended by: Teodora Medici on: 10/28/2022 08:06 AM   Modules accepted: Orders

## 2022-10-28 NOTE — Progress Notes (Signed)
Daily Group Progress Note   Program: [facility]     Individual Time: 9 a.m. to 12 p.m.    Type of Therapy: Process and Psychoeducational    Topic: The therapist checks in with group members, assesses for SI/HI/psychosis and overall level of functioning. The therapist inquires about sobriety date and number of community support meetings attended since last session.    The therapist introduces a new group member to the two members who were not present at last group and facilitates a discussion on the culture of AA discussing the difference between "old time" AA and AA's newer views on things such as people in recovery taking psychotropic medications. He normalizes using dreams and the fact that one need not use as a result of having one. He answers group member's questions about when a person can be involuntarily committed and what Adult Protective Services does. He answers questions about what is meant by being "on the pink cloud" and how people can fall off this cloud due to "PAWS."    Summary: Tracey Lester presents rating her depression as a "9" and anxiety as an "8."    She admits that she has had using dreams not knowing what they were until today. She says that she wants to stop using drugs as she does not want to live the 2nd half of her life like she did her first. She shares about how she got various prescriptions from doctors knowing what to say and how she eventually told her therapist about the Kratom use. She notes that for a while that she "blamed the truth" on not being able to get her controlled substances anymore and asks the reason that she cannot be on Adderall and attend group. After the therapist explains this, Tracey Lester notes that it was after she quit drinking that she began using Kratom and how Suboxone has helped her to stop. She worries that with Suboxone that she is not doing it on her how; however, the therapist and other group members compare taking Suboxone to a diabetic needing  insulin and behavior change to control his or her blood sugar.  As a result of her childhood, she notes that she is "constantly on high alert" and telling the story that makes her look like all is well. She describes her mood as "optimistic" and "trusting" and is encouraged to attend a meeting with some of the other women in group.    Progress Towards Goals: [name] reports no drug or alcohol use   UDS collected: Yes Results: No   AA/NA attended?: No   Sponsor?: No   Adam Phenix, MA, LCSW, [facility], LCAS 10/28/2022

## 2022-10-29 ENCOUNTER — Encounter (HOSPITAL_COMMUNITY): Payer: Self-pay | Admitting: Licensed Clinical Social Worker

## 2022-10-29 ENCOUNTER — Ambulatory Visit (HOSPITAL_COMMUNITY): Payer: 59 | Admitting: Licensed Clinical Social Worker

## 2022-10-30 ENCOUNTER — Ambulatory Visit (HOSPITAL_COMMUNITY): Payer: 59 | Admitting: Licensed Clinical Social Worker

## 2022-10-30 ENCOUNTER — Encounter (HOSPITAL_COMMUNITY): Payer: 59

## 2022-10-30 ENCOUNTER — Telehealth (HOSPITAL_COMMUNITY): Payer: Self-pay | Admitting: Licensed Clinical Social Worker

## 2022-10-30 DIAGNOSIS — F3181 Bipolar II disorder: Secondary | ICD-10-CM

## 2022-10-30 DIAGNOSIS — F1920 Other psychoactive substance dependence, uncomplicated: Secondary | ICD-10-CM

## 2022-10-30 DIAGNOSIS — F1021 Alcohol dependence, in remission: Secondary | ICD-10-CM

## 2022-10-30 NOTE — Telephone Encounter (Signed)
[facility] needed to cancel pt's outpatient therapy session on 1/4 and pt requested clinician call.   Returned pts call and discussed current thoughts/feelings about participating in [place] group and overall personal growth.  Reviewed appointments already scheduled and will cancel/add as needed as pt continues as part of the [place] group.   Pt encouraged to call back PRN for phone follow ups

## 2022-10-30 NOTE — Progress Notes (Signed)
Daily Group Progress Note   Program: [facility] IOP     Individual Time: 9 a.m. to 12 p.m.    Type of Therapy: Process and Psychoeducational    Topic: The therapist checks in with group members, assesses for SI/HI/psychosis and overall level of functioning. The therapist inquires about sobriety date and number of community support meetings attended since last session.    The therapist facilitates discussions on a number of topics including what a resentment chip is and why it is the only chip that a person gives back, the pros and cons of virtual versus in person meetings, the stigma associated with addiction, why group members are encouraged to connect connect with recovery support meetings while in group versus after group ends, and the reason that a person cannot work steps on his or her own and the research concerning recovery taking place in fellowship. The therapist stresses that addiction is a disease and not a character defect.    Summary: Tracey Lester presents rating her depression as a "0" and anxiety as an "0."    She reports not changes in her sobriety date and no meeting attendance; however, she says that she learned about the [employer] meeting in this group and exchanged numbers with some of the women in group today.   She says that she feels "accepted" and "trusting." She talks about contacting her individual therapist only to find that she cannot see her until she steps down from group. The therapist reiterates that individual and family therapy are part of IOP's bundled rate. Tracey Lester says that she did not feel rejected by her therapist and notes that she was the first person with whom she could be honest and that it was a positive to realize that her therapist still had unconditional positive regards towards her after doing so.   Tracey Lester asks if it is possible to work the steps on one's own and is told about the Twelve Step worksheets to use but advised that the Steps involve other  people such as Step Five requiring a human with whom one shares his or her character defects.    Progress Towards Goals: Tracey Lester reports no drug or alcohol use   UDS collected: No Results: No   AA/NA attended?: No   Sponsor?: No   Adam Phenix, MA, LCSW, [facility], LCAS 10/30/2022

## 2022-11-02 ENCOUNTER — Ambulatory Visit (HOSPITAL_COMMUNITY): Payer: 59 | Admitting: Medical

## 2022-11-02 ENCOUNTER — Encounter (HOSPITAL_COMMUNITY): Payer: Self-pay | Admitting: Medical

## 2022-11-02 ENCOUNTER — Encounter (HOSPITAL_COMMUNITY): Payer: 59

## 2022-11-02 DIAGNOSIS — F1920 Other psychoactive substance dependence, uncomplicated: Secondary | ICD-10-CM | POA: Diagnosis not present

## 2022-11-02 DIAGNOSIS — F411 Generalized anxiety disorder: Secondary | ICD-10-CM

## 2022-11-02 DIAGNOSIS — G4701 Insomnia due to medical condition: Secondary | ICD-10-CM

## 2022-11-02 DIAGNOSIS — F3181 Bipolar II disorder: Secondary | ICD-10-CM

## 2022-11-02 DIAGNOSIS — F3342 Major depressive disorder, recurrent, in full remission: Secondary | ICD-10-CM

## 2022-11-02 DIAGNOSIS — F1021 Alcohol dependence, in remission: Secondary | ICD-10-CM

## 2022-11-02 DIAGNOSIS — Z62819 Personal history of unspecified abuse in childhood: Secondary | ICD-10-CM

## 2022-11-02 DIAGNOSIS — F4312 Post-traumatic stress disorder, chronic: Secondary | ICD-10-CM

## 2022-11-02 NOTE — Progress Notes (Signed)
Patient ID: Tracey Lester, female   DOB: 26-Apr-1971, 52 y.o.   MRN: [id number] Met with patient to review materials on addicted brain not available at intake

## 2022-11-03 NOTE — Progress Notes (Signed)
Daily Group Progress Note   Program: [facility] IOP     Individual Time: 9 a.m. to 12 p.m.    Type of Therapy: Process and Psychoeducational    Topic: The therapist checks in with group members, assesses for SI/HI/psychosis and overall level of functioning. The therapist inquires about sobriety date and number of community support meetings attended since last session.    The therapist explains that Sponsors will often give Sponsees assignments such as calling different people in the program to get comfortable with calling people as many people do not feel comfortable doing so when they first start the program The therapist notes that many things Sponsees are encouraged to do in recovery serve as a form of exposure therapy. The therapist facilitates a discussion on what is meant about the saying that people need to be "selfish" in their recovery. The therapist also facilitates a discussion on how assumptions of the stories that people choose to tell themselves can end up becoming a barrier in connecting with sober supports.   The therapist shows a video introducing group members to what is PAWS.   Summary: Tiwana presents rating her depression as a "0" and anxiety as an "0."    She asks questions about what is meant by "discharge plan" on the daily check-in sheet with the therapist explaining this to her. She reports having the same sobriety date; however, she has still not attended any meetings. She says that she is "getting close" to attending a meeting noting that she downloaded the meeting app. At the same time, she admits to a great deal of fear and apprehension about attending a meeting due to not wanting to "be seen." It is difficult to tease out what it is that [place] specifically fears; however, it is possible that she has extreme social anxiety. The therapist and other group members explain the format of most meetings and reassure her that she will not be put on the spot or have to share during a  meeting. When asked when she will attend, she agrees to attend an in-person meeting this evening with two other women in group.  Kohana describes her mood as "guilty" and "resistant." She is concerned that by attending IOP and meetings that she is spending too much time on herself. The therapist explores what Ridhima would be doing if not attending IOP, and she basically concludes that she is not neglecting any of her responsibilities as a result of this other than dusting her plans.   The therapist and other group members emphasize that attending IOP and AA is practicing self-care and critical for Lulani to be able to address everything else in her life.    Progress Towards Goals: Carianna reports no drug or alcohol use   UDS collected: Yes Results: Yes, negative for drugs and alcohol   AA/NA attended?: No   Sponsor?: No   Adam Phenix, MA, LCSW, [facility], LCAS 11/02/2022

## 2022-11-04 ENCOUNTER — Encounter (HOSPITAL_COMMUNITY): Payer: 59

## 2022-11-04 ENCOUNTER — Ambulatory Visit (HOSPITAL_COMMUNITY): Payer: 59 | Admitting: Licensed Clinical Social Worker

## 2022-11-04 DIAGNOSIS — F1021 Alcohol dependence, in remission: Secondary | ICD-10-CM

## 2022-11-04 DIAGNOSIS — F1920 Other psychoactive substance dependence, uncomplicated: Secondary | ICD-10-CM | POA: Diagnosis not present

## 2022-11-04 DIAGNOSIS — F3181 Bipolar II disorder: Secondary | ICD-10-CM

## 2022-11-04 NOTE — Progress Notes (Signed)
Daily Group Progress Note   Program: [facility] IOP     Individual Time: 9 a.m. to 12 p.m.    Type of Therapy: Process and Psychoeducational    Topic: The therapist checks in with group members, assesses for SI/HI/psychosis and overall level of functioning. The therapist inquires about sobriety date and number of community support meetings attended since last session.    The therapist introduces a new group member. He answers a group member's question concerning why the daily check-in form asks about appetite noting that there are several reasons for asking this one of which involves "HALT." The therapist also explains that reason that people at NA and [place] meetings say both their names and add that they are either an "alcoholic" or an "addict" as a means of no longer feeling ashamed of identifying as a person with this disease. The therapist explains the function of various people at meetings such as the person who chairs the meeting and explains why "war stories" are not allowed at meetings.   The therapist explains that there are three meetings at a meeting which are the meeting before the meeting the meeting, and the meeting after the meeting and how arriving early and/or staying late allows a person to meet other people in the program.  The therapist presents information on assertiveness and how and why it is an important skill to learn in relation to one's recovery and the reasons that many people were not taught this in their family or origin. The therapist notes that if a person is not assertive in the work place that the person is likely to end up hungry, angry, lonely, or tired.   Summary: Alisan presents rating her depression as a "0" and anxiety as an "0."    She describes her mood as "thankful" and "loving." She reports having the same sobriety date. She attended her first AA meeting saying that she was glad going in that she knew that she did not have to talk and appreciative of being able to  attend with two other women in IOP.  She says that she raised her hand when they asked if there were any newcomers; however, she could not get herself to say that she was an "alcoholic" noting that she has written it on paper but never able to say it out loud. She says that she wants to try a NA meeting. Willisha says that her favorite part of the group was seeing men who were able to be vulnerable. She also picked up her first chip. She says that what she liked least was that the group had too many people so wants to try a smaller meeting.  Jazzalynn says that she arrived a little late and must make herself arrive on time which she says goes against her nature. The therapist and other group members explain the benefits of arriving early to socialize.    Progress Towards Goals: Maisie reports no drug or alcohol use   UDS collected: Yes Results: No   AA/NA attended?: Yes   Sponsor?: No   Adam Phenix, MA, LCSW, [facility], LCAS 11/04/2022

## 2022-11-06 ENCOUNTER — Ambulatory Visit (HOSPITAL_COMMUNITY): Payer: 59 | Admitting: Licensed Clinical Social Worker

## 2022-11-06 ENCOUNTER — Encounter (HOSPITAL_COMMUNITY): Payer: 59

## 2022-11-06 DIAGNOSIS — F1920 Other psychoactive substance dependence, uncomplicated: Secondary | ICD-10-CM

## 2022-11-06 DIAGNOSIS — F3181 Bipolar II disorder: Secondary | ICD-10-CM

## 2022-11-06 DIAGNOSIS — F1021 Alcohol dependence, in remission: Secondary | ICD-10-CM

## 2022-11-06 NOTE — Progress Notes (Signed)
Daily Group Progress Note   Program: CD IOP     Individual Time: 9 a.m. to 12 p.m.    Type of Therapy: Process and Psychoeducational    Topic: The therapist checks in with group members, assesses for SI/HI/psychosis and overall level of functioning. The therapist inquires about sobriety date and number of community support meetings attended since last session.    The therapist facilitates discussions about Celebrate Recovery; the need to plan specifically when a person is going to attend what meeting versus guessing when a person will attend; addiction as a chronic and progressive disease; the importance of self-care, avoiding people, places, and things and doing service work to prevent relapse and have long-term recovery; and attending 90 meetings in 90 days.  Towards the end of the group, the therapist has group members share their addiction stories and how they came to group at the request of one of the newest members.    Summary: Tracey Lester presents rating her depression as a "0" and anxiety as an "0."    She describes her mood as "proud" and "inspired." She reports no change in her sobriety date and no meetings. She also describes her mood a "curious."   [name] asks what elements need to be in place for a person to avoid relapse and asks to hear other members' stories wanting to know what it was that got them to this group.  The therapist questions when [name] will attend another meeting. She realizes she is more likely to attend if she plans it so says that she wants to attend a NA meeting on-line today.     Progress Towards Goals: [name] reports no drug or alcohol use   UDS collected: No Results: No   AA/NA attended?: No   Sponsor?: No   Adam Phenix, MA, LCSW, [facility], LCAS 11/06/2022

## 2022-11-07 ENCOUNTER — Other Ambulatory Visit: Payer: Self-pay | Admitting: Psychiatry

## 2022-11-07 DIAGNOSIS — F3342 Major depressive disorder, recurrent, in full remission: Secondary | ICD-10-CM

## 2022-11-09 ENCOUNTER — Ambulatory Visit (HOSPITAL_COMMUNITY): Payer: 59 | Admitting: Licensed Clinical Social Worker

## 2022-11-09 ENCOUNTER — Encounter (HOSPITAL_COMMUNITY): Payer: 59

## 2022-11-09 DIAGNOSIS — F1021 Alcohol dependence, in remission: Secondary | ICD-10-CM

## 2022-11-09 DIAGNOSIS — F1920 Other psychoactive substance dependence, uncomplicated: Secondary | ICD-10-CM

## 2022-11-09 DIAGNOSIS — F3181 Bipolar II disorder: Secondary | ICD-10-CM

## 2022-11-09 NOTE — Progress Notes (Signed)
Daily Group Progress Note   Program: [facility] IOP     Individual Time: 9 a.m. to 12 p.m.    Type of Therapy: Process and Psychoeducational    Topic: The therapist checks in with group members, assesses for SI/HI/psychosis and overall level of functioning. The therapist inquires about sobriety date and number of community support meetings attended since last session.    Clinician introduced topic of building a sober support network today.  Clinician explained how this can be defined as having a having a group of healthy, sober people in one's life you can talk to, spend time with, and get direct help from when necessary in order to reinforce abstinence efforts.  Clinician noted several benefits that can result from strengthening one's network, including chances to repair existing relationships harmed from past behavior, having an outlet to express thoughts without judgement, receiving positive peer pressure, reducing chance of relapse, and having access to more opportunities for growth such as learning of job openings, or social events.  Clinician explained that some barriers can make it difficult to connect with other people, including the presence of anxiety or depression, or moving to an unfamiliar area.  Group members were asked to assess the current state of their support network, and identify ways that this could be improved.  Tips were given on how to address previously noted barriers, such as strengthening social skills, using relaxation techniques to reduce anxiety, scheduling social time each week, and/or exploring social events nearby which could increase chances of meeting new supports.     Summary: Client presented for session on time and was alert, oriented x5, with no evidence or self-report of active SI/HI or A/V H.  Client reported compliance with medication and denied use of alcohol or illicit substances since last check-in.  Client reported scores of 0/10 for both depression and anxiety.  Client reported that a recent success was attending 1 virtual support meeting over the weekend, stating "That was huge for me".  Client reported that a recent struggle has been dealing with her sister, who she has been having trouble with for some time now.  Client reported that her goal today is to be mindful of avoiding substances or behaviors that she might 'substitute' for Kratom or alcohol in an effort to self-medicate difficult feelings.  Intervention was effective, as evidenced by client's active engagement in discussion on subject, reporting that she has lacked support in her recovery for some time, and hoped that [place] would aid her in expanding network with sober peers.  Client reported that she has appreciated feedback from peers, and hopes that the material covered in group will also help her establish healthier boundaries with 'toxic' people like her sister, who she believes to be jealous of the positive changes she has been making in her life recently.  Client reported that she would like to begin attending in-person support meetings locally once she improves social skills, and curbs anxiety, which could lead to sponsorship if she finds the right individual to confide in.         Progress Towards Goals: Client reports no drug or alcohol use   UDS collected: No Results: No   AA/NA attended?: Yes   Sponsor?: No   Shade Flood, LCSW, LCAS 11/09/22

## 2022-11-11 ENCOUNTER — Telehealth (HOSPITAL_COMMUNITY): Payer: Self-pay | Admitting: Licensed Clinical Social Worker

## 2022-11-11 ENCOUNTER — Encounter (HOSPITAL_COMMUNITY): Payer: 59

## 2022-11-11 ENCOUNTER — Ambulatory Visit (HOSPITAL_COMMUNITY): Payer: Managed Care, Other (non HMO)

## 2022-11-11 NOTE — Progress Notes (Signed)
Name: Tracey Lester   MRN: [id number]    DOB: May 02, 1971   Date:11/12/2022       Progress Note  Subjective  Chief Complaint  Chief Complaint  Patient presents with   Annual Exam    HPI  Patient presents for annual CPE.  Diet: well rounded, cutting back on red meat Exercise:  regular - hot power yoga 7x week Last Eye Exam: UTD Last Dental Exam: UTD   Depression: Phq 9 is  negative    11/12/2022   10:29 AM 10/23/2022    3:14 PM 09/03/2022   11:54 AM 07/21/2022    9:22 AM 04/21/2022   10:57 AM  Depression screen PHQ 2/9  Decreased Interest 0 0     Down, Depressed, Hopeless 0 0     PHQ - 2 Score 0 0     Altered sleeping 0 0     Tired, decreased energy 0 0     Change in appetite 0 0     Feeling bad or failure about yourself  0 0     Trouble concentrating 0 0     Moving slowly or fidgety/restless 0 0     Suicidal thoughts 0 0     PHQ-9 Score 0 0     Difficult doing work/chores Not difficult at all Not difficult at all        Information is confidential and restricted. Go to Review Flowsheets to unlock data.   Hypertension: BP Readings from Last 3 Encounters:  11/12/22 108/72  10/23/22 122/78  03/22/22 114/76   Obesity: Wt Readings from Last 3 Encounters:  11/12/22 168 lb 14.4 oz (76.6 kg)  10/23/22 166 lb 12.8 oz (75.7 kg)  01/06/19 155 lb 3.2 oz (70.4 kg)   BMI Readings from Last 3 Encounters:  11/12/22 24.23 kg/m  10/23/22 23.93 kg/m  01/06/19 22.27 kg/m     Vaccines:   Tdap: UTD  Shingrix: At age 52 Pneumonia: At age 52 Flu: UTD COVID-19: 2 vaccines    Hep C Screening: 2023  STD testing and prevention (HIV/chl/gon/syphilis): no concerns  Intimate partner violence: negative screen  Menstrual History/LMP/Abnormal Bleeding: periods are becoming more irregular  Discussed importance of follow up if any post-menopausal bleeding: not applicable  Incontinence Symptoms: negative for symptoms   Breast cancer:  - Last Mammogram: 2018, has one  scheduled 11/27/22  Osteoporosis Prevention : Discussed high calcium and vitamin D supplementation, weight bearing exercises Bone density :no   Cervical cancer screening: Due today  Skin cancer: Discussed monitoring for atypical lesions  Colorectal cancer: Due, calling to schedule  Lung cancer:  Low Dose CT Chest recommended if Age 15-80 years, 20 pack-year currently smoking OR have quit w/in 15years. Patient does not qualify for screen. Did quit smoking in 2018. ECG: 09/04/22  Advanced Care Planning: A voluntary discussion about advance care planning including the explanation and discussion of advance directives.  Discussed health care proxy and Living will, and the patient was able to identify a health care proxy as husband Kambra Beachem.  Patient does not know have a living will and power of attorney of health care   Lipids: Lab Results  Component Value Date   CHOL 161 10/23/2022   CHOL 170 02/21/2021   Lab Results  Component Value Date   HDL 59 10/23/2022   HDL 69 02/21/2021   Lab Results  Component Value Date   LDLCALC 78 10/23/2022   LDLCALC 87 02/21/2021   Lab Results  Component Value Date  TRIG 140 10/23/2022   TRIG 68 02/21/2021   Lab Results  Component Value Date   CHOLHDL 2.7 10/23/2022   CHOLHDL 2.5 02/21/2021   No results found for: "LDLDIRECT"  Glucose: Glucose, Bld  Date Value Ref Range Status  10/23/2022 78 65 - 99 mg/dL Final    Comment:    .            Fasting reference interval .   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) 08/10/2022   History of alcohol abuse 08/10/2022   Insomnia due to medical condition 07/21/2022   Other psychoactive substance use, unsp with withdrawal, unsp (HCC) 07/21/2022    MDD (major depressive disorder), recurrent, severe, with psychosis (HCC) 09/17/2020   MDD (major depressive disorder), recurrent, in full remission (HCC) 04/22/2020   High risk medication use 04/22/2020   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   GAD (generalized anxiety disorder) 05/31/2019   MDD (major depressive disorder), recurrent episode, mild (HCC) 05/31/2019   Insomnia due to mental disorder 05/31/2019   Family history of celiac disease 11/28/2018   Generalized abdominal pain 11/28/2018   Alternating constipation and diarrhea 11/28/2018    Past Surgical History:  Procedure Laterality Date   TONSILLECTOMY      Family History  Problem Relation Age of Onset   Anxiety disorder Mother    Depression Mother    Hypertension Mother    Anxiety disorder Father    Depression Father    Hypertension Father    Schizophrenia Paternal Grandmother    Depression Paternal Grandmother    Hearing loss Paternal Grandmother    Hypertension Paternal Grandmother    Depression Maternal Grandmother     Social History   Socioeconomic History   Marital status: Married    Spouse name: michael   Number of children: 3   Years of education: Not on file   Highest education level: Associate degree: occupational, Scientist, product/process developmenttechnical, or vocational program  Occupational History   Not on file  Tobacco Use   Smoking status: Former    Types: Cigarettes    Quit date: 10/08/2006    Years since quitting: 16.1   Smokeless tobacco: Never  Vaping Use   Vaping Use: Never used  Substance and Sexual Activity   Alcohol use: Not Currently    Alcohol/week: 5.0 standard drinks of alcohol    Types: 4 Glasses of wine, 1 Cans of beer per week   Drug use: No   Sexual activity: Yes    Birth control/protection: None  Other Topics Concern   Not on file  Social History Narrative   Not on file   Social Determinants of Health   Financial Resource Strain: Low Risk  (11/12/2022)   Overall Financial Resource Strain  (CARDIA)    Difficulty of Paying Living Expenses: Not hard at all  Food Insecurity: No Food Insecurity (11/12/2022)   Hunger Vital Sign    Worried About Running Out of Food in the Last Year: Never true    Ran Out of Food in the Last Year: Never true  Transportation Needs: No Transportation Needs (11/12/2022)   PRAPARE - Administrator, Civil ServiceTransportation    Lack of Transportation (Medical): No    Lack of Transportation (Non-Medical): No  Physical Activity: Sufficiently Active (11/12/2022)  Exercise Vital Sign    Days of Exercise per Week: 6 days    Minutes of Exercise per Session: 30 min  Stress: No Stress Concern Present (11/12/2022)   [employer] of Occupational Health - Occupational Stress Questionnaire    Feeling of Stress : Only a little  Social Connections: Socially Integrated (11/12/2022)   Social Connection and Isolation Panel [NHANES]    Frequency of Communication with Friends and Family: More than three times a week    Frequency of Social Gatherings with Friends and Family: More than three times a week    Attends Religious Services: More than 4 times per year    Active Member of [employer] or Organizations: Yes    Attends Engineer, structural: More than 4 times per year    Marital Status: Married  Catering manager Violence: Not At Risk (11/12/2022)   Humiliation, Afraid, Rape, and Kick questionnaire    Fear of Current or Ex-Partner: No    Emotionally Abused: No    Physically Abused: No    Sexually Abused: No     Current Outpatient Medications:    amitriptyline (ELAVIL) 25 MG tablet, TAKE 1 TABLET(25 MG) BY MOUTH AT BEDTIME, Disp: 30 tablet, Rfl: 0   buprenorphine-naloxone (SUBOXONE) 2-0.5 mg SUBL SL tablet, Place 1 tablet under the tongue daily., Disp: , Rfl:    Iron, Ferrous Sulfate, 325 (65 Fe) MG TABS, Take 325 mg by mouth daily. (Patient taking differently: Take 325 mg by mouth daily. Liquid version), Disp: 30 tablet, Rfl: 3   lamoTRIgine (LAMICTAL) 100 MG tablet, Take 1 tablet  (100 mg total) by mouth daily., Disp: 90 tablet, Rfl: 1   sertraline (ZOLOFT) 100 MG tablet, Take 1.5 tablets (150 mg total) by mouth daily., Disp: 135 tablet, Rfl: 0   valACYclovir (VALTREX) 500 MG tablet, Take 1 tablet (500 mg total) by mouth daily as needed., Disp: 30 tablet, Rfl: 0  Allergies  Allergen Reactions   Codeine Itching     Review of Systems  All other systems reviewed and are negative.   Objective  Vitals:   11/12/22 1042  BP: 108/72  Pulse: 84  Resp: 16  Temp: 98 F (36.7 C)  TempSrc: Oral  SpO2: 97%  Weight: 168 lb 14.4 oz (76.6 kg)    Body mass index is 24.23 kg/m.  Physical Exam Exam conducted with a chaperone present.  Constitutional:      Appearance: Normal appearance.  HENT:     Head: Normocephalic and atraumatic.  Eyes:     Conjunctiva/sclera: Conjunctivae normal.  Cardiovascular:     Rate and Rhythm: Normal rate and regular rhythm.  Pulmonary:     Effort: Pulmonary effort is normal.     Breath sounds: Normal breath sounds.  Chest:  Breasts:    Right: Normal.     Left: Normal.  Genitourinary:    Comments: External genitalia within normal limits.  Vaginal mucosa pink, moist, normal rugae.  Nonfriable cervix with one polyp visualized, no discharge or bleeding noted on speculum exam.    Lymphadenopathy:     Upper Body:     Right upper body: No supraclavicular, axillary or pectoral adenopathy.     Left upper body: No supraclavicular, axillary or pectoral adenopathy.  Skin:    General: Skin is warm and dry.  Neurological:     General: No focal deficit present.     Mental Status: She is alert. Mental status is at baseline.  Psychiatric:  Mood and Affect: Mood normal.        Behavior: Behavior normal.     Recent Results (from the past 2160 hour(s))  HIV antibody (with reflex)     Status: None   Collection Time: 10/23/22  4:05 PM  Result Value Ref Range   HIV 1&2 Ab, 4th Generation NON-REACTIVE NON-REACTIVE    Comment: HIV-1  antigen and HIV-1/HIV-2 antibodies were not detected. There is no laboratory evidence of HIV infection. Marland Kitchen PLEASE NOTE: This information has been disclosed to you from records whose confidentiality may be protected by state law.  If your state requires such protection, then the state law prohibits you from making any further disclosure of the information without the specific written consent of the person to whom it pertains, or as otherwise permitted by law. A general authorization for the release of medical or other information is NOT sufficient for this purpose. . For additional information please refer to http://education.[facility].com/faq/FAQ106 (This link is being provided for informational/ educational purposes only.) . Marland Kitchen The performance of this assay has not been clinically validated in patients less than 37 years old. .   Hepatitis C Antibody     Status: None   Collection Time: 10/23/22  4:05 PM  Result Value Ref Range   Hepatitis C Ab NON-REACTIVE NON-REACTIVE    Comment: . HCV antibody was non-reactive. There is no laboratory  evidence of HCV infection. . In most cases, no further action is required. However, if recent HCV exposure is suspected, a test for HCV RNA (test code [phone]) is suggested. . For additional information please refer to http://education.[facility].com/faq/FAQ22v1 (This link is being provided for informational/ educational purposes only.) .   CBC w/Diff/Platelet     Status: Abnormal   Collection Time: 10/23/22  4:05 PM  Result Value Ref Range   WBC 5.1 3.8 - 10.8 Thousand/uL   RBC 4.31 3.80 - 5.10 Million/uL   Hemoglobin 11.3 (L) 11.7 - 15.5 g/dL   HCT 34.0 (L) 35.0 - 45.0 %   MCV 78.9 (L) 80.0 - 100.0 fL   MCH 26.2 (L) 27.0 - 33.0 pg   MCHC 33.2 32.0 - 36.0 g/dL   RDW 17.8 (H) 11.0 - 15.0 %   Platelets 307 140 - 400 Thousand/uL   MPV 10.1 7.5 - 12.5 fL   Neutro Abs 2,463 1,500 - 7,800 cells/uL   Lymphs Abs 1,576 850 -  3,900 cells/uL   Absolute Monocytes 689 200 - 950 cells/uL   Eosinophils Absolute 281 15 - 500 cells/uL   Basophils Absolute 92 0 - 200 cells/uL   Neutrophils Relative % 48.3 %   Total Lymphocyte 30.9 %   Monocytes Relative 13.5 %   Eosinophils Relative 5.5 %   Basophils Relative 1.8 %  COMPLETE METABOLIC PANEL WITH GFR     Status: None   Collection Time: 10/23/22  4:05 PM  Result Value Ref Range   Glucose, Bld 78 65 - 99 mg/dL    Comment: .            Fasting reference interval .    BUN 13 7 - 25 mg/dL   Creat 0.64 0.50 - 0.99 mg/dL   eGFR 108 > OR = 60 mL/min/1.43m2   BUN/Creatinine Ratio SEE NOTE: 6 - 22 (calc)    Comment:    Not Reported: BUN and Creatinine are within    reference range. .    Sodium 139 135 - 146 mmol/L   Potassium 4.0 3.5 - 5.3 mmol/L  Chloride 104 98 - 110 mmol/L   CO2 28 20 - 32 mmol/L   Calcium 9.6 8.6 - 10.2 mg/dL   Total Protein 6.3 6.1 - 8.1 g/dL   Albumin 4.2 3.6 - 5.1 g/dL   Globulin 2.1 1.9 - 3.7 g/dL (calc)   AG Ratio 2.0 1.0 - 2.5 (calc)   Total Bilirubin 0.3 0.2 - 1.2 mg/dL   Alkaline phosphatase (APISO) 77 31 - 125 U/L   AST 19 10 - 35 U/L   ALT 13 6 - 29 U/L  Lipid Profile     Status: None   Collection Time: 10/23/22  4:05 PM  Result Value Ref Range   Cholesterol 161 <200 mg/dL   HDL 59 > OR = 50 mg/dL   Triglycerides 161 <096 mg/dL   LDL Cholesterol (Calc) 78 mg/dL (calc)    Comment: Reference range: <100 . Desirable range <100 mg/dL for primary prevention;   <70 mg/dL for patients with CHD or diabetic patients  with > or = 2 CHD risk factors. Marland Kitchen LDL-C is now calculated using the Martin-Hopkins  calculation, which is a validated novel method providing  better accuracy than the Friedewald equation in the  estimation of LDL-C.  Horald Pollen et al. Lenox Ahr. 0454;098(11): 2061-2068  (http://education.[facility].com/faq/FAQ164)    Total CHOL/HDL Ratio 2.7 <5.0 (calc)   Non-HDL Cholesterol (Calc) 102 <130 mg/dL (calc)     Comment: For patients with diabetes plus 1 major ASCVD risk  factor, treating to a non-HDL-C goal of <100 mg/dL  (LDL-C of <91 mg/dL) is considered a therapeutic  option.   Iron, TIBC and Ferritin Panel     Status: Abnormal   Collection Time: 10/23/22  4:05 PM  Result Value Ref Range   Iron 65 40 - 190 mcg/dL   TIBC 478 295 - 621 mcg/dL (calc)   %SAT 17 16 - 45 % (calc)   Ferritin 3 (L) 16 - 232 ng/mL  TEST AUTHORIZATION     Status: None   Collection Time: 10/23/22  4:05 PM  Result Value Ref Range   TEST NAME: IRON, TIBC AND FERRITIN PANEL    TEST CODE: [id number]    CLIENT CONTACT: LESLIE SMITH    REPORT ALWAYS MESSAGE SIGNATURE      Comment: . The laboratory testing on this patient was verbally requested or confirmed by the ordering physician or his or her authorized representative after contact with an employee of [employer]. Federal regulations require that we maintain on file written authorization for all laboratory testing.  Accordingly we are asking that the ordering physician or his or her authorized representative sign a copy of this report and promptly return it to the client service representative. . . Signature:____________________________________________________ . Please fax this signed page to [phone] or return it via your [employer] courier.      Fall Risk:    11/12/2022   10:29 AM 10/23/2022    3:14 PM  Fall Risk   Falls in the past year? 0 0  Number falls in past yr: 0 0  Injury with Fall? 0 0  Risk for fall due to : No Fall Risks   Follow up Falls prevention discussed;Education provided;Falls evaluation completed      Functional Status Survey: Is the patient deaf or have difficulty hearing?: No Does the patient have difficulty seeing, even when wearing glasses/contacts?: No Does the patient have difficulty concentrating, remembering, or making decisions?: No Does the patient have difficulty walking or climbing stairs?: No Does  the patient have difficulty dressing or bathing?: No Does the patient have difficulty doing errands alone such as visiting a doctor's office or shopping?: No   Assessment & Plan  1. Annual physical exam/Cervical cancer screening Pap today.   - Cytology - PAP  -USPSTF grade A and B recommendations reviewed with patient; age-appropriate recommendations, preventive care, screening tests, etc discussed and encouraged; healthy living encouraged; see AVS for patient education given to patient -Discussed importance of 150 minutes of physical activity weekly, eat two servings of fish weekly, eat one serving of tree nuts ( cashews, pistachios, pecans, almonds.Marland Kitchen) every other day, eat 6 servings of fruit/vegetables daily and drink plenty of water and avoid sweet beverages.   -Reviewed Health Maintenance: Yes.

## 2022-11-11 NOTE — Telephone Encounter (Signed)
The therapist calls Tracey Lester to apologize for class having to be cancelled today but informs her that he will be in on 11/13/22.  Adam Phenix, [place], LCSW, [facility], [place] 11/11/2022

## 2022-11-12 ENCOUNTER — Other Ambulatory Visit (HOSPITAL_COMMUNITY)
Admission: RE | Admit: 2022-11-12 | Discharge: 2022-11-12 | Disposition: A | Discharge status: Home / Self Care | Payer: Managed Care, Other (non HMO) | Source: Ambulatory Visit | Attending: Internal Medicine | Admitting: Internal Medicine

## 2022-11-12 ENCOUNTER — Encounter: Payer: Self-pay | Admitting: Internal Medicine

## 2022-11-12 ENCOUNTER — Ambulatory Visit (INDEPENDENT_AMBULATORY_CARE_PROVIDER_SITE_OTHER): Payer: Managed Care, Other (non HMO) | Admitting: Internal Medicine

## 2022-11-12 VITALS — BP 108/72 | HR 84 | Temp 98.0°F | Resp 16 | Ht 70.0 in | Wt 168.9 lb

## 2022-11-12 DIAGNOSIS — Z124 Encounter for screening for malignant neoplasm of cervix: Secondary | ICD-10-CM

## 2022-11-12 DIAGNOSIS — Z0000 Encounter for general adult medical examination without abnormal findings: Secondary | ICD-10-CM | POA: Diagnosis present

## 2022-11-12 NOTE — Patient Instructions (Addendum)
It was great seeing you today!  Plan discussed at today's visit: -Pap today -Mammogram scheduled 2/2 -Call to schedule colon cancer screening  Follow up in: 6 months for follow up, sooner for acute visit   Take care and let us know if you have any questions or concerns prior to your next visit.  Dr. Rosana Berger

## 2022-11-13 ENCOUNTER — Ambulatory Visit (HOSPITAL_COMMUNITY): Payer: 59 | Admitting: Licensed Clinical Social Worker

## 2022-11-13 DIAGNOSIS — F1920 Other psychoactive substance dependence, uncomplicated: Secondary | ICD-10-CM

## 2022-11-13 DIAGNOSIS — F1021 Alcohol dependence, in remission: Secondary | ICD-10-CM

## 2022-11-13 DIAGNOSIS — F3181 Bipolar II disorder: Secondary | ICD-10-CM

## 2022-11-13 NOTE — Progress Notes (Signed)
Daily Group Progress Note   Program: CD IOP     Individual Time: 9 a.m. to 12 p.m.    Type of Therapy: Process and Psychoeducational    Topic: The therapist checks in with group members, assesses for SI/HI/psychosis and overall level of functioning. The therapist inquires about sobriety date and number of community support meetings attended since last session.    The therapist introduces a new member to group and discusses how a person must work as hard in recovery as he or she did at getting drunk or high and continues to talk about why a person gets a Publishing copy and how to choose one in addition to how to troubleshoot problems that one may have with his or her Sponsor.  The therapist explains why isolating for an addict is a high risk situation noting that people in recover are encouraged to call others when having a hard time which means calling others when the person does not really feel like doing so. The therapist notes that isolation and depression can become comfortable if one does not resist them and reach out to others. The therapist talks about how a break-up can be a risk to relapse and the need to avoid people, places, and things.    Summary: Tracey Lester presents rating her depression as a "0" and anxiety as an "1."    She says that she has the same sobriety date and that she attended a NA meeting on-line. She says that she does not see herself going alone to an in-person meeting saying that she did not cry for the first time in a long time at this meeting. At the same time, she feels badly about not being able to make herself share as the older members really put themselves out there for her as it was a newcomer's meeting. The therapist explains that as a newcomer that she is not expected to share but to simply listen and take everything in. Tracey Lester says that she felt like she belonged more in NA than AA.  She describes her mood as "raw" and "open." She talks about her past belief that  using substances to shut off her emotions was not a bad thing noting that she needed to "shut" this "stuff off to be successful."   Tracey Lester is very active in group discussions today and asks permission to ask questions of one of the newcomers wanting to know more about what a residential treatment center is as this newcomer recently discharged from one.    Progress Towards Goals: Tracey Lester reports no drug or alcohol use   UDS collected: No Results: Yes, negative for drugs and alcohol   AA/NA attended?: Yes   Sponsor?: No   Adam Phenix, MA, LCSW, [facility], LCAS 11/13/2022

## 2022-11-16 ENCOUNTER — Ambulatory Visit (HOSPITAL_COMMUNITY): Payer: 59 | Admitting: Licensed Clinical Social Worker

## 2022-11-16 DIAGNOSIS — F1920 Other psychoactive substance dependence, uncomplicated: Secondary | ICD-10-CM

## 2022-11-16 DIAGNOSIS — F3181 Bipolar II disorder: Secondary | ICD-10-CM

## 2022-11-16 DIAGNOSIS — F1021 Alcohol dependence, in remission: Secondary | ICD-10-CM

## 2022-11-16 NOTE — Progress Notes (Signed)
Daily Group Progress Note   Program: CD IOP     Individual Time: 9 a.m. to 12 p.m.    Type of Therapy: Process and Psychoeducational    Topic: The therapist checks in with group members, assesses for SI/HI/psychosis and overall level of functioning. The therapist inquires about sobriety date and number of community support meetings attended since last session.    The therapist discusses the difficulty that many people have with giving up substance using friends while noting that it is not necessary to give up friends who are truly social drinkers though a person in early recovery likely does not need to be around even social drinking.   The therapist explains what is meant by co-dependency and how it can mask as being altruism but is not. The therapist educates group members of the dynamics of being an ACOA and how this relates to co-dependency stressing the importance of examining how one's family dynamics has impacted one's behavior in the present. The therapist recommends several good books aimed at helping with uncovering issues that may not be conscious. The therapist explains why many people who grew up with an addicted parent or parents often find themselves attracted to relationships with people actively in addiction talking about Freud's concept of the "repetition compulsion."  The therapist talks about how many people's sobriety is initially due to external constraints and why it is important that a person's recovery becomes internalized and one's own. The therapist answers a question concerning what is meant by a "dry drunk" providing numerous examples of what this looks like noting the importance of getting out of unhealthy relationships and being in healthy and supportive relationships without socially isolating.    Summary: Hadar presents rating her depression as a "0" and anxiety as an "6."    She reports no change in her sobriety date.She has been to no meetings describing her  mood as "happy" and "playful." In talking about meetings, she says that she has not been "paying attention to it" at one point saying that she does not want a sober support community. When the therapist later notes this comment, [employer] is surprised that she said it with other group members confirming that this is what she said.   Sonji admits to fears about having to give up two of her close friends; however, per her description, they are social drinkers. Thus, the therapist informs Kolby that she does not have to give them up if they are not actively in addiction. Dharma eventually suggests that she is open to attending a meeting with the therapist suggesting that she schedule the day and meeting that she will attend. She concludes that she just needs to try some different meetings.   She says that her husband is an ACOA and believes that he has some co-dependency in relation to her. She says that she used to go in her closet and close the door to not inadvertently blow up at her husband or kids. She has taken the door of the hinges and will now go for a walk instead.    Progress Towards Goals: Maahi reports no drug or alcohol use   UDS collected: Yes Results: No   AA/NA attended?: No   Sponsor?: No   Adam Phenix, MA, LCSW, [facility], LCAS 11/16/2022

## 2022-11-17 ENCOUNTER — Ambulatory Visit (HOSPITAL_COMMUNITY): Payer: 59 | Admitting: Licensed Clinical Social Worker

## 2022-11-17 LAB — CYTOLOGY - PAP
Comment: NEGATIVE
Diagnosis: NEGATIVE
Diagnosis: REACTIVE
High risk HPV: NEGATIVE

## 2022-11-18 ENCOUNTER — Ambulatory Visit (INDEPENDENT_AMBULATORY_CARE_PROVIDER_SITE_OTHER): Payer: 59 | Admitting: Licensed Clinical Social Worker

## 2022-11-18 DIAGNOSIS — F4312 Post-traumatic stress disorder, chronic: Secondary | ICD-10-CM

## 2022-11-18 DIAGNOSIS — F1920 Other psychoactive substance dependence, uncomplicated: Secondary | ICD-10-CM | POA: Diagnosis not present

## 2022-11-18 DIAGNOSIS — F1021 Alcohol dependence, in remission: Secondary | ICD-10-CM

## 2022-11-18 NOTE — Progress Notes (Signed)
Daily Group Progress Note   Program: CD IOP     Individual Time: 9 a.m. to 12 p.m.    Type of Therapy: Process and Psychoeducational    Topic: The therapist checks in with group members, assesses for SI/HI/psychosis and overall level of functioning. The therapist inquires about sobriety date and number of community support meetings attended since last session.    The therapist answers group members' questions about the services provided at the [facility] including the urgent care downstairs and outpatient behavioral health services upstairs and how they will be connected to aftercare upon completing IOP as needed.   He again explains the reasons that it is best for a person with less than a year of sobriety, who is not already in a dating relationship to not enter into one and why it is especially frowned up for two newcomers in recovery to start dating.   The therapist uses a narrative therapy approach to illustrate things that a person's disease can tell him explaining why addiction is described as being "baffling and cunning." He stresses the importance to be able to discern the difference between a person's healthy self talking and a person's disease talking. He notes that many people with addiction tend to think of solitary activities to do as a means of coping with overwhelming feelings versus thinking of reaching out to others which normally does not come natural to them as many people in recovery have histories of trauma leading to trust issues. He notes that getting in the habit of calling others in the program when things are going well makes it easier to call when things are not going well. The therapist again educates members on the negative impact that tobacco use has in relation to long-term abstinence from alcohol.   Summary: Tracey Lester presents rating her depression as a "2" and anxiety as an "2."    She went to two on-line AA meetings saying that one was fine; however, she got a lot out of  the other one which was attended by a number of old timers. 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 Tracey Lester says that she could have listened to these people for hours and now sees that she does need a Sponsor previously having not seen this.  Tracey Lester says that her mood is "encouraged" and "humbled."   Progress Towards Goals: Tracey Lester reports no drug or alcohol use   UDS collected: No Results: No   AA/NA attended?: Yes   Sponsor?: No   Adam Phenix, [place], LCSW, [facility], [place] 11/18/2022

## 2022-11-20 ENCOUNTER — Ambulatory Visit (HOSPITAL_COMMUNITY): Payer: 59 | Admitting: Licensed Clinical Social Worker

## 2022-11-20 DIAGNOSIS — F4312 Post-traumatic stress disorder, chronic: Secondary | ICD-10-CM

## 2022-11-20 DIAGNOSIS — F1920 Other psychoactive substance dependence, uncomplicated: Secondary | ICD-10-CM

## 2022-11-20 DIAGNOSIS — F1021 Alcohol dependence, in remission: Secondary | ICD-10-CM

## 2022-11-20 NOTE — Progress Notes (Signed)
Daily Group Progress Note   Program: CD IOP     Individual Time: 9 a.m. to 12 p.m.    Type of Therapy: Process and Psychoeducational    Topic: The therapist checks in with group members, assesses for SI/HI/psychosis and overall level of functioning. The therapist inquires about sobriety date and number of community support meetings attended since last session.    The therapist answers group members' questions about various substances such as Ketamine and "Tranq." He reads the NA Just for Today reading on self-centeredness facilitating a discussion of how this connects with persons with addiction's tendency to not trust people which leads to social isolation and a lack of reality testing. The therapist suggests that people in recovery overcome their discomfort with being around other people and sharing their feelings through exposure therapy if they "keep coming back."   The therapist has a Product/process development scientist today who is a former graduate of CD IOP with 11 months of sobriety who shares her story and answers questions from group members.    Summary: Tracey Lester presents rating her depression as a "0" and anxiety as an "0."    She has not attended any more meetings since last group. She says that she plans on attending two meetings this weekend and finding a Publishing copy. She is very active in asking questions of the former CD IOP member and gets her phone number after group to call and possibly consider as a Publishing copy.  Her affect is appropriate during the first half of group; however, after the discussion about the need for connecting with people versus one's addiction, her affect is tearful throughout the latter part of group. She describes her mood as "overwhelmed" and "overwhelmed" admitting that she fears that her life will be terrible without substances. The therapist and the guest presenter normalize this fear while working to instill hope that things will get better on the other side of PAWS.  The  therapist answers Tracey Lester's question concerning how she will get more comfortable being around people when not using with the therapist explaining that continuing to attend meetings will serve as exposure therapy such that she will become comfortable without using.    Progress Towards Goals: Tracey Lester reports no drug or alcohol use   UDS collected: No Results: Yes, negative for drugs and alcohol   AA/NA attended?: No   Sponsor?: No   Adam Phenix, MA, LCSW, [facility], LCAS 11/20/2022

## 2022-11-23 ENCOUNTER — Ambulatory Visit (HOSPITAL_COMMUNITY): Payer: 59 | Admitting: Medical

## 2022-11-23 ENCOUNTER — Encounter (HOSPITAL_COMMUNITY): Payer: Self-pay | Admitting: Medical

## 2022-11-23 DIAGNOSIS — F1221 Cannabis dependence, in remission: Secondary | ICD-10-CM

## 2022-11-23 DIAGNOSIS — F1920 Other psychoactive substance dependence, uncomplicated: Secondary | ICD-10-CM | POA: Diagnosis not present

## 2022-11-23 DIAGNOSIS — F418 Other specified anxiety disorders: Secondary | ICD-10-CM

## 2022-11-23 DIAGNOSIS — Z9189 Other specified personal risk factors, not elsewhere classified: Secondary | ICD-10-CM

## 2022-11-23 DIAGNOSIS — M545 Low back pain, unspecified: Secondary | ICD-10-CM

## 2022-11-23 DIAGNOSIS — R198 Other specified symptoms and signs involving the digestive system and abdomen: Secondary | ICD-10-CM

## 2022-11-23 DIAGNOSIS — F4312 Post-traumatic stress disorder, chronic: Secondary | ICD-10-CM

## 2022-11-23 DIAGNOSIS — F449 Dissociative and conversion disorder, unspecified: Secondary | ICD-10-CM

## 2022-11-23 DIAGNOSIS — F3181 Bipolar II disorder: Secondary | ICD-10-CM

## 2022-11-23 DIAGNOSIS — Z62819 Personal history of unspecified abuse in childhood: Secondary | ICD-10-CM

## 2022-11-23 DIAGNOSIS — F341 Dysthymic disorder: Secondary | ICD-10-CM

## 2022-11-23 DIAGNOSIS — F411 Generalized anxiety disorder: Secondary | ICD-10-CM

## 2022-11-23 DIAGNOSIS — G4701 Insomnia due to medical condition: Secondary | ICD-10-CM

## 2022-11-23 DIAGNOSIS — F5105 Insomnia due to other mental disorder: Secondary | ICD-10-CM

## 2022-11-23 DIAGNOSIS — F3342 Major depressive disorder, recurrent, in full remission: Secondary | ICD-10-CM

## 2022-11-23 DIAGNOSIS — Z8659 Personal history of other mental and behavioral disorders: Secondary | ICD-10-CM

## 2022-11-23 DIAGNOSIS — F908 Attention-deficit hyperactivity disorder, other type: Secondary | ICD-10-CM

## 2022-11-23 DIAGNOSIS — F1021 Alcohol dependence, in remission: Secondary | ICD-10-CM

## 2022-11-23 DIAGNOSIS — F410 Panic disorder [episodic paroxysmal anxiety] without agoraphobia: Secondary | ICD-10-CM

## 2022-11-23 MED ORDER — QUETIAPINE FUMARATE 25 MG PO TABS: ORAL_TABLET | ORAL | 2 refills | Status: DC

## 2022-11-23 NOTE — Progress Notes (Signed)
Daily Group Progress Note   Program: CD IOP     Individual Time: 9 a.m. to 12 p.m.    Type of Therapy: Process and Psychoeducational    Topic: The therapist checks in with group members, assesses for SI/HI/psychosis and overall level of functioning. The therapist inquires about sobriety date and number of community support meetings attended since last session.    The therapist covers an array of topics including answering questions about PAWS, explaining what is meant by living one's life with "intentionality," and emphasizing that if a person is assertive and the other person becomes upset that the person being assertive did not "cause" the other person's emotional reaction.  The therapist reads the NA Just for Today reading discussing how the First Step is an action step in that  if a person has taken this step then he will get rid of drug paraphernalia; avoid people, places, and things, etcetera. The therapist also explains that self-compassion is a key part of recovery and essential in avoiding emotional relapse.   The therapist shows a [employer] video illustrating that there is no such thing as a soft versus a hard drug and explains in depth what is meant by a standard drink.    Summary: Tracey Lester presents rating her depression as a "0" and anxiety as an "0."    She says that she has 30 days of sobriety and that she attended a closed meeting in which one other older woman was in attendance with Rubina saying that the meeting was "amazing" and that she "loved it." She worried that she was talking too much; however, the other woman reassured Tracey Lester that she was not.   Tracey Lester says that her mood is "accepted" and "optimistic" and she discloses that she got a Medical sales representative. Tracey Lester says that the woman in the group's Sponsor did not drink but smoked pot with Tracey Lester noting that this would be a deterrent regarding asking her to be her Sponsor as Tracey Lester would take this as almost an  invitation to smoke pot with the video today illustrating why pot is also to be avoided.  Tracey Lester is surprised to learn that there is the same amount of pure alcohol in certain amounts of beer and wine as liquor and asks how many standard drinks are in a large box of wine with the therapist giving her this figure.    Progress Towards Goals: Tracey Lester reports no drug or alcohol use   UDS collected: Yes Results: No   AA/NA attended?: Yes   Sponsor?: Yes   Adam Phenix, [place], LCSW, [facility], [place] 11/23/2022
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
## 2022-11-25 ENCOUNTER — Ambulatory Visit (HOSPITAL_COMMUNITY): Payer: 59 | Admitting: Licensed Clinical Social Worker

## 2022-11-25 DIAGNOSIS — F1021 Alcohol dependence, in remission: Secondary | ICD-10-CM

## 2022-11-25 DIAGNOSIS — F4312 Post-traumatic stress disorder, chronic: Secondary | ICD-10-CM

## 2022-11-25 DIAGNOSIS — F1920 Other psychoactive substance dependence, uncomplicated: Secondary | ICD-10-CM | POA: Diagnosis not present

## 2022-11-25 NOTE — Progress Notes (Signed)
Daily Group Progress Note   Program: CD IOP     Individual Time: 9 a.m. to 12 p.m.    Type of Therapy: Process and Psychoeducational    Topic: The therapist checks in with group members, assesses for SI/HI/psychosis and overall level of functioning. The therapist inquires about sobriety date and number of community support meetings attended since last session.    The therapist introduces a new member to group and facilitates a discussion on self-talk and how to counter unhelpful self-talk as well as the role of encouraging self-talk and self-compassion in recovery.   The therapist suggests that understanding the reason that a person treats others badly is fine provided that it does not become an excuse for tolerating inexcusable behavior and not setting limits.    The therapist observes that a person in early recovery is not in a position to be caring for others with chronic health conditions as recovery is a full-time job. He educates group members on the systems in place to take care of disabled adults in need of protective services who lack the capacity to consent to these services.    Summary: Alexah presents rating her depression as a "0" and anxiety as an "0."    She holds up an [place] which one of the other members of this group gave to her as a gift. She reports no change in her sobriety date and has not been to another meeting but is checking in with her Sponsor daily and going to meet with her on Tuesdays. She describes her mood as "interested," "happy," and "tired."   Gwenlyn says that before she came to IOP that she thought that she had done Step One as she told her individual therapist about her substance use; however, now realizes that she had not completed it as she could not identify as being an "addict" which she can now.  The therapist observes that Albena seems to be less hard on herself now compared to when she came to group to which she is in agreement. Another  group member comments on how much better Sheilah seems to be doing in comparison to when she first started.    Progress Towards Goals: Teneisha reports no drug or alcohol use   UDS collected: No Results: No   AA/NA attended?: No   Sponsor?: Yes   Adam Phenix, [place], LCSW, [facility], [place] 11/25/2022

## 2022-11-26 ENCOUNTER — Telehealth (HOSPITAL_COMMUNITY): Payer: Self-pay | Admitting: Licensed Clinical Social Worker

## 2022-11-26 NOTE — Telephone Encounter (Signed)
The therapist attempts to return Tracey Lester's call about wanting family therapy leaving a HIPAA-compliant voicemail.  Adam Phenix, [place], LCSW, [facility], [place] 11/26/2022

## 2022-11-27 ENCOUNTER — Ambulatory Visit (INDEPENDENT_AMBULATORY_CARE_PROVIDER_SITE_OTHER): Payer: 59 | Admitting: Licensed Clinical Social Worker

## 2022-11-27 ENCOUNTER — Ambulatory Visit: Payer: Self-pay

## 2022-11-27 DIAGNOSIS — F1920 Other psychoactive substance dependence, uncomplicated: Secondary | ICD-10-CM

## 2022-11-27 DIAGNOSIS — F4312 Post-traumatic stress disorder, chronic: Secondary | ICD-10-CM

## 2022-11-27 DIAGNOSIS — F1021 Alcohol dependence, in remission: Secondary | ICD-10-CM

## 2022-11-27 NOTE — Progress Notes (Addendum)
Daily Group Progress Note   Program: CD IOP     Individual Time: 9 a.m. to 12 p.m.    Type of Therapy: Process and Psychoeducational    Topic: The therapist checks in with group members, assesses for SI/HI/psychosis and overall level of functioning. The therapist inquires about sobriety date and number of community support meetings attended since last session.    The therapist discusses the pitfalls of self-sponsorship, the reason that war stories are to be avoided at meetings, and how a person's ability to concentrate and reason improve as one gets more and more sobriety. The therapist talks about the role of overcoming conditioned guilt as it relates to self-care.  The therapist has group members view the video, "Breaking the Addiction Cycle" and complete the first two exercise.    Summary: Tracey Lester presents rating her depression as a "0" and anxiety as an "0."    She says that she felt the same way another group member did about people approaching her at meetings and giving her numbers but is more comfortable with this now. She attended an on-line meeting and got with her Sponsor to talk about self-care mostly.   Tracey Lester says that this "should" be easier for her noting that she is having difficulty with "being selfish" in her recovery; however, she is proud that she said "no" to her boss about something. She says that she continues to work on self-care and setting healthier boundaries.   Her emotions are "happy" and "curious." She identifies her biggest rationalization for why she was not an addict as the fact that she viewed the drugs as enhancing her versus making her worse as one would assume to be the case with an addict.   Tracey Lester says that her mood is "happy" and "curious."    Progress Towards Goals: Tracey Lester reports no drug or alcohol use   UDS collected: No Results: Yes, negative for drugs and alcohol   AA/NA attended?: Yes   Sponsor?: Yes   Adam Phenix, [place], [place], [facility],  [place] 11/27/2022

## 2022-11-30 ENCOUNTER — Ambulatory Visit (INDEPENDENT_AMBULATORY_CARE_PROVIDER_SITE_OTHER): Payer: 59 | Admitting: Licensed Clinical Social Worker

## 2022-11-30 DIAGNOSIS — F1920 Other psychoactive substance dependence, uncomplicated: Secondary | ICD-10-CM | POA: Diagnosis not present

## 2022-11-30 DIAGNOSIS — F1021 Alcohol dependence, in remission: Secondary | ICD-10-CM

## 2022-11-30 DIAGNOSIS — F4312 Post-traumatic stress disorder, chronic: Secondary | ICD-10-CM

## 2022-12-01 ENCOUNTER — Telehealth: Payer: Self-pay

## 2022-12-01 ENCOUNTER — Ambulatory Visit (HOSPITAL_COMMUNITY): Payer: 59 | Admitting: Licensed Clinical Social Worker

## 2022-12-01 NOTE — Progress Notes (Signed)
Daily Group Progress Note   Program: CD IOP     Individual Time: 9 a.m. to 12 p.m.    Type of Therapy: Process and Psychoeducational    Topic: The therapist checks in with group members, assesses for SI/HI/psychosis and overall level of functioning. The therapist inquires about sobriety date and number of community support meetings attended since last session.    The therapist  continues showing the video, "Breaking the Addiction Cycle" and has group members complete and discuss exercises three and four with their homework to prepare to describe their using rituals at next group.   The therapist discusses the [place] in relation to the disease of addiction being chronic and progressive and in relation to consequences of the disease occurring more and more frequently as the disease progresses.    Summary: Jayleena presents rating her depression as a "1" and anxiety as an "1."    She reports having the same sobriety date but has attended no meetings since her last group. She says that she spent the weeks self-purging a lot of unneeded items from her closet and in doing so found a newcomer chip from a meeting in addition to the one that she picked up recently. She thus concludes that she previously did attend a meeting of which she has no memory saying that she was likely blackout drunk which she finds unsettling.  Ellasyn offers reassurance to another group member who is resistant to attending meetings and letting people know she is an alcoholic as Thurza had this same issue. Charlena is the only member in group who opts to not share what consequence was her bottom that caused her to enter treatment.   Progress Towards Goals: Satcha reports no drug or alcohol use   UDS collected: Yes Results: No   AA/NA attended?: No   Sponsor?: Yes   Adam Phenix, [place], [place], [facility], [place] 12/01/2022

## 2022-12-01 NOTE — Telephone Encounter (Signed)
Pt would like to schedule colonoscopy. [phone] Pt received your letter

## 2022-12-01 NOTE — Telephone Encounter (Signed)
Returned patients call to schedule her colonoscopy.  LVM for pt to return my call.   Thanks, Kanyon Bunn, CMA 

## 2022-12-02 ENCOUNTER — Ambulatory Visit (INDEPENDENT_AMBULATORY_CARE_PROVIDER_SITE_OTHER): Payer: 59 | Admitting: Licensed Clinical Social Worker

## 2022-12-02 DIAGNOSIS — F1920 Other psychoactive substance dependence, uncomplicated: Secondary | ICD-10-CM

## 2022-12-02 DIAGNOSIS — F4312 Post-traumatic stress disorder, chronic: Secondary | ICD-10-CM

## 2022-12-02 DIAGNOSIS — F1021 Alcohol dependence, in remission: Secondary | ICD-10-CM

## 2022-12-02 NOTE — Progress Notes (Signed)
Daily Group Progress Note   Program: CD IOP     Individual Time: 9 a.m. to 12 p.m.    Type of Therapy: Process and Psychoeducational    Topic: The therapist checks in with group members, assesses for SI/HI/psychosis and overall level of functioning. The therapist inquires about sobriety date and number of community support meetings attended since last session.    The therapist introduces a new group member and has existing members share their stories of how they came to be in [facility]. The therapist again talks about how assertiveness connects to recovery and how it helps people to avoid entering emotional relapse via limit setting. He reviews the stages of relapse explaining why the program emphasizes picking up the phone and reaching out to people even when not thinking of using. He explains the brain-based reasons that people in early recovery must be especially diligent about avoiding triggers and on being smart not strong. He also discusses that research has determined that recovery takes place in fellowship and that having a non-using support system and using it is imperative.    Summary: Tanika presents rating her depression as a "0" and anxiety as an "0."    She has been to no meetings with the therapist observing that Aleiyah seems to be drifting away from meeting attendance as she did not attend meetings over the weekend either. She says that she did meet with her Suboxone prescriber. She also met with her Sponsor and completed Step One and is working on Step Two.   Sevin says that she feels "humbled" and "surprised." She becomes tearful in talking about her struggle with seeing the purpose in life and seeing the good in life with all the suffering. The therapist suggests that keeping a gratitude journal might help to shift her focus away from only the horrors in the world to things that are good.   In telling her story, Valeta discusses how she was blind-sided by becoming addicted to  [place] not knowing it was addictive while noting that Kratom helped her to stop drinking. She says that if it were not for this group that she would still be taking Adderall and Ambien but now fully understands the reason that people are not allowed to be on controlled substances while in [facility] IOP. She says that she used to give Adderall "all the credit" for her successes and when she started SA IOP was looking forward to when she could resume this medication; however, she now does not want to go back on it ever.  Progress Towards Goals: Jaelle reports no drug or alcohol use   UDS collected: No Results: No   AA/NA attended?: No   Sponsor?: Yes   Adam Phenix, [place], LCSW, [facility], [place] 12/03/2022

## 2022-12-03 ENCOUNTER — Other Ambulatory Visit: Payer: Self-pay | Admitting: *Deleted

## 2022-12-03 DIAGNOSIS — Z1211 Encounter for screening for malignant neoplasm of colon: Secondary | ICD-10-CM

## 2022-12-03 MED ORDER — NA SULFATE-K SULFATE-MG SULF 17.5-3.13-1.6 GM/177ML PO SOLN: 1.0000 | Freq: Once | ORAL | 0 refills | Status: AC

## 2022-12-03 NOTE — Telephone Encounter (Signed)
Gastroenterology Pre-Procedure Review  Request Date: 12/21/2022 Requesting Physician: Dr. Vicente Males  PATIENT REVIEW QUESTIONS: The patient responded to the following health history questions as indicated:    1. Are you having any GI issues? no 2. Do you have a personal history of Polyps? no 3. Do you have a family history of Colon Cancer or Polyps? no 4. Diabetes Mellitus? no 5. Joint replacements in the past 12 months?no 6. Major health problems in the past 3 months?no 7. Any artificial heart valves, MVP, or defibrillator?no    MEDICATIONS & ALLERGIES:    Patient reports the following regarding taking any anticoagulation/antiplatelet therapy:   Plavix, Coumadin, Eliquis, Xarelto, Lovenox, Pradaxa, Brilinta, or Effient? no Aspirin? no  Patient confirms/reports the following medications:  Current Outpatient Medications  Medication Sig Dispense Refill   amitriptyline (ELAVIL) 25 MG tablet TAKE 1 TABLET(25 MG) BY MOUTH AT BEDTIME 30 tablet 0   buprenorphine-naloxone (SUBOXONE) 2-0.5 mg SUBL SL tablet Place 1 tablet under the tongue daily.     Iron, Ferrous Sulfate, 325 (65 Fe) MG TABS Take 325 mg by mouth daily. (Patient taking differently: Take 325 mg by mouth daily. Liquid version) 30 tablet 3   lamoTRIgine (LAMICTAL) 100 MG tablet Take 1 tablet (100 mg total) by mouth daily. 90 tablet 1   QUEtiapine (SEROQUEL) 25 MG tablet Take 1-2 tablets as directedat bedtime 60 tablet 2   sertraline (ZOLOFT) 100 MG tablet Take 1.5 tablets (150 mg total) by mouth daily. 135 tablet 0   valACYclovir (VALTREX) 500 MG tablet Take 1 tablet (500 mg total) by mouth daily as needed. 30 tablet 0   No current facility-administered medications for this visit.    Patient confirms/reports the following allergies:  Allergies  Allergen Reactions   Codeine Itching    No orders of the defined types were placed in this encounter.   AUTHORIZATION INFORMATION Primary Insurance: 1D#: Group #:  Secondary  Insurance: 1D#: Group #:  SCHEDULE INFORMATION: Date: 12/21/2022 Time: Location: [place]

## 2022-12-03 NOTE — Addendum Note (Signed)
Addended by: Jacqualin Combes on: 12/03/2022 11:59 AM   Modules accepted: Orders

## 2022-12-03 NOTE — Telephone Encounter (Signed)
Message left for patient to return my call.  

## 2022-12-04 ENCOUNTER — Ambulatory Visit (INDEPENDENT_AMBULATORY_CARE_PROVIDER_SITE_OTHER): Payer: 59 | Admitting: Licensed Clinical Social Worker

## 2022-12-04 DIAGNOSIS — F1021 Alcohol dependence, in remission: Secondary | ICD-10-CM

## 2022-12-04 DIAGNOSIS — F1920 Other psychoactive substance dependence, uncomplicated: Secondary | ICD-10-CM

## 2022-12-04 DIAGNOSIS — F4312 Post-traumatic stress disorder, chronic: Secondary | ICD-10-CM

## 2022-12-04 NOTE — Progress Notes (Signed)
Daily Group Progress Note   Program: [facility]     Individual Time: 9 a.m. to 12 p.m.    Type of Therapy: Process and Psychoeducational    Topic: The therapist checks in with group members, assesses for SI/HI/psychosis and overall level of functioning. The therapist inquires about sobriety date and number of community support meetings attended since last session.    The therapist introduces a new group member and, again, has existing members share their stories of how they came to be in [facility]. The therapist facilitates discussion on what a home group  is, what is meant by a burning desire, why Sponsors have to the the same sex as Sponsees, what is meant by the phrase, "13th stepping," and what is meant by the "pink cloud." The therapist all talks about various phrases that are forms of denial such as "it's all natural." The therapist continues to explain the reason that addiction is an illness and not a character defect and normalizes people being nervous when they first come to treatment noting that many people with addiction do not initially like or trust people and many have issues with social anxiety that using substances alleviates.    Summary: Tracey Lester presents rating her depression as a "0" and anxiety as an "0."    She went to a meeting this morning and says that she has a meeting in [place] that she wants to be her home group which she attends virtually but can also attend in person. She asks if a person can make a virtual group his or her home group and asks for more information concerning what the purpose of a home group is which the therapist and other members answer.  Tracey Lester says that she now has a Scientist, forensic who has over 30 years of sobriety. She notes that with the meeting in [place] that she wants to attend the meeting before the meeting, the meeting, and the meeting after the meeting. She describes her mood as "trusting" and "optimistic" but "not content" with the  therapist normalizing Tracey Lester's not feeling content this early in recovery.   She shares her story of how she came to be in treatment and talks about the perils of Kratom and how Kratom caused her to have excessively large bowel movements such that she could not use a bathroom out in public and then explains how dealing with this was part of her using ritual.   Progress Towards Goals: Tracey Lester reports no drug or alcohol use   UDS collected: No Results: Yes, negative for drugs and alcohol   AA/NA attended?: Yes   Sponsor?: Yes   Tracey Lester, [place], [place], [facility], [place] 12/04/2022

## 2022-12-07 ENCOUNTER — Other Ambulatory Visit: Payer: Self-pay | Admitting: Psychiatry

## 2022-12-07 ENCOUNTER — Ambulatory Visit (INDEPENDENT_AMBULATORY_CARE_PROVIDER_SITE_OTHER): Payer: 59 | Admitting: Licensed Clinical Social Worker

## 2022-12-07 DIAGNOSIS — F4312 Post-traumatic stress disorder, chronic: Secondary | ICD-10-CM

## 2022-12-07 DIAGNOSIS — F1021 Alcohol dependence, in remission: Secondary | ICD-10-CM

## 2022-12-07 DIAGNOSIS — F3342 Major depressive disorder, recurrent, in full remission: Secondary | ICD-10-CM

## 2022-12-07 DIAGNOSIS — F1920 Other psychoactive substance dependence, uncomplicated: Secondary | ICD-10-CM | POA: Diagnosis not present

## 2022-12-08 NOTE — Progress Notes (Signed)
Daily Group Progress Note   Program: [facility]     Individual Time: 9 a.m. to 12 p.m.    Type of Therapy: Process and Psychoeducational    Topic: The therapist checks in with group members, assesses for SI/HI/psychosis and overall level of functioning. The therapist inquires about sobriety date and number of community support meetings attended since last session.    The therapist has group members complete the exercise on sharing what their using rituals looked like. The therapist talks about lying in relation to addiction noting that addiction requires dishonesty.   The therapist answers questions about HIPAA and talks about the importance of family being involved in treatment when possible so as to understand the disease of addiction and to be supportive of one's need to attend meetings.    Summary: Tracey Lester presents rating her depression as a "0" and anxiety as an "0."    Tracey Lester shares her using ritual with the group noting that she always had her Kratom in the same pink cup. She says that she has the same sobriety date and notes that she has had a "bunch of using dreams" which were very real saying that it is good to know that this is a normal occurrence. She says that she felt relieved that she had not used.   She says that she has an alarm set as she checks in with her Sponsor every day at 4 p.m. She says that she had a good conversation with her Sponsor about "people pleasing" becoming tearful as she talks about this. Tracey Lester says that her 50 year-old still wakes her up to make sure that she is okay which she realizes stems from when she was using. She says that when she is attending a virtual meeting and identifies as an alcoholic and addict that her husband questions why she identifies as such not believing she is an alcoholic or addict. He also questions the reason that she needs these people suggesting that she already has a support system.   The therapist suggests that Tracey Lester talk to  her husband about coming in for a meeting with Enid Derry and this therapist noting that it is important that he supports her recovery. Tracey Lester notes that her husband's parents were addicts but he apparently cannot recognize that Tracey Lester also has this disease.   Progress Towards Goals: Tracey Lester reports no drug or alcohol use   UDS collected: Yes Results: No   AA/NA attended?: Yes   Sponsor?: Yes   Adam Phenix, [place], [place], [facility], [place] 12/07/2022

## 2022-12-09 ENCOUNTER — Encounter: Payer: Self-pay | Admitting: Emergency Medicine

## 2022-12-09 ENCOUNTER — Ambulatory Visit (HOSPITAL_COMMUNITY): Payer: 59

## 2022-12-09 ENCOUNTER — Observation Stay
Admission: EM | Admit: 2022-12-09 | Discharge: 2022-12-11 | Disposition: A | Discharge status: Home / Self Care | Payer: Managed Care, Other (non HMO) | Attending: General Surgery | Admitting: General Surgery

## 2022-12-09 ENCOUNTER — Emergency Department: Payer: Managed Care, Other (non HMO)

## 2022-12-09 ENCOUNTER — Encounter (HOSPITAL_COMMUNITY): Payer: Self-pay

## 2022-12-09 DIAGNOSIS — K353 Acute appendicitis with localized peritonitis, without perforation or gangrene: Secondary | ICD-10-CM | POA: Diagnosis not present

## 2022-12-09 DIAGNOSIS — Z87891 Personal history of nicotine dependence: Secondary | ICD-10-CM | POA: Diagnosis not present

## 2022-12-09 DIAGNOSIS — R1031 Right lower quadrant pain: Secondary | ICD-10-CM | POA: Diagnosis present

## 2022-12-09 LAB — CBC
HCT: 39.7 % (ref 36.0–46.0)
Hemoglobin: 13.4 g/dL (ref 12.0–15.0)
MCH: 27.9 pg (ref 26.0–34.0)
MCHC: 33.8 g/dL (ref 30.0–36.0)
MCV: 82.7 fL (ref 80.0–100.0)
Platelets: 267 10*3/uL (ref 150–400)
RBC: 4.8 MIL/uL (ref 3.87–5.11)
RDW: 18.5 % — ABNORMAL HIGH (ref 11.5–15.5)
WBC: 14.6 10*3/uL — ABNORMAL HIGH (ref 4.0–10.5)
nRBC: 0 % (ref 0.0–0.2)

## 2022-12-09 LAB — URINALYSIS, ROUTINE W REFLEX MICROSCOPIC
Bilirubin Urine: NEGATIVE
Glucose, UA: NEGATIVE mg/dL
Hgb urine dipstick: NEGATIVE
Ketones, ur: NEGATIVE mg/dL
Leukocytes,Ua: NEGATIVE
Nitrite: NEGATIVE
Protein, ur: NEGATIVE mg/dL
Specific Gravity, Urine: 1.02 (ref 1.005–1.030)
pH: 5 (ref 5.0–8.0)

## 2022-12-09 LAB — COMPREHENSIVE METABOLIC PANEL
ALT: 17 U/L (ref 0–44)
AST: 21 U/L (ref 15–41)
Albumin: 4.1 g/dL (ref 3.5–5.0)
Alkaline Phosphatase: 71 U/L (ref 38–126)
Anion gap: 10 (ref 5–15)
BUN: 12 mg/dL (ref 6–20)
CO2: 25 mmol/L (ref 22–32)
Calcium: 9.5 mg/dL (ref 8.9–10.3)
Chloride: 100 mmol/L (ref 98–111)
Creatinine, Ser: 0.7 mg/dL (ref 0.44–1.00)
GFR, Estimated: >60 mL/min (ref >60)
Glucose, Bld: 120 mg/dL — ABNORMAL HIGH (ref 70–99)
Potassium: 3.5 mmol/L (ref 3.5–5.1)
Sodium: 135 mmol/L (ref 135–145)
Total Bilirubin: 1 mg/dL (ref 0.3–1.2)
Total Protein: 6.7 g/dL (ref 6.5–8.1)

## 2022-12-09 LAB — POC URINE PREG, ED: Preg Test, Ur: NEGATIVE

## 2022-12-09 LAB — LIPASE, BLOOD: Lipase: 34 U/L (ref 11–51)

## 2022-12-09 MED ORDER — DIPHENHYDRAMINE HCL 25 MG PO CAPS: 25.0000 mg | ORAL_CAPSULE | Freq: Four times a day (QID) | ORAL | Status: DC | PRN

## 2022-12-09 MED ORDER — MORPHINE SULFATE (PF) 4 MG/ML IV SOLN
4.0000 mg | Freq: Once | INTRAVENOUS | Status: AC
  Administered 2022-12-09: 4 mg via INTRAVENOUS
  Filled 2022-12-09: qty 1

## 2022-12-09 MED ORDER — ACETAMINOPHEN 325 MG PO TABS: 650.0000 mg | ORAL_TABLET | Freq: Four times a day (QID) | ORAL | Status: DC | PRN

## 2022-12-09 MED ORDER — SODIUM CHLORIDE 0.9 % IV SOLN: 2.0000 g | Freq: Once | INTRAVENOUS | Status: DC

## 2022-12-09 MED ORDER — QUETIAPINE FUMARATE 25 MG PO TABS
25.0000 mg | ORAL_TABLET | Freq: Every day | ORAL | Status: DC
  Filled 2022-12-09 (×2): qty 1

## 2022-12-09 MED ORDER — SERTRALINE HCL 50 MG PO TABS
150.0000 mg | ORAL_TABLET | Freq: Every day | ORAL | Status: DC
  Administered 2022-12-10 – 2022-12-11 (×2): 150 mg via ORAL
  Filled 2022-12-09 (×2): qty 3

## 2022-12-09 MED ORDER — KETOROLAC TROMETHAMINE 30 MG/ML IJ SOLN
15.0000 mg | Freq: Once | INTRAMUSCULAR | Status: AC
  Administered 2022-12-09: 15 mg via INTRAVENOUS
  Filled 2022-12-09: qty 1

## 2022-12-09 MED ORDER — ONDANSETRON HCL 4 MG/2ML IJ SOLN
4.0000 mg | Freq: Once | INTRAMUSCULAR | Status: AC
  Administered 2022-12-09: 4 mg via INTRAVENOUS
  Filled 2022-12-09: qty 2

## 2022-12-09 MED ORDER — MORPHINE SULFATE (PF) 4 MG/ML IV SOLN
4.0000 mg | INTRAVENOUS | Status: DC | PRN
  Administered 2022-12-10 – 2022-12-11 (×5): 4 mg via INTRAVENOUS
  Filled 2022-12-09 (×5): qty 1

## 2022-12-09 MED ORDER — ACETAMINOPHEN 650 MG RE SUPP: 650.0000 mg | Freq: Four times a day (QID) | RECTAL | Status: DC | PRN

## 2022-12-09 MED ORDER — LAMOTRIGINE 100 MG PO TABS
100.0000 mg | ORAL_TABLET | Freq: Every day | ORAL | Status: DC
  Administered 2022-12-10 – 2022-12-11 (×2): 100 mg via ORAL
  Filled 2022-12-09 (×2): qty 1

## 2022-12-09 MED ORDER — DIPHENHYDRAMINE HCL 50 MG/ML IJ SOLN: 25.0000 mg | Freq: Four times a day (QID) | INTRAMUSCULAR | Status: DC | PRN

## 2022-12-09 MED ORDER — BUPRENORPHINE HCL-NALOXONE HCL 2-0.5 MG SL SUBL
1.0000 | SUBLINGUAL_TABLET | Freq: Every day | SUBLINGUAL | Status: DC
  Administered 2022-12-11: 1 via SUBLINGUAL
  Filled 2022-12-09: qty 1

## 2022-12-09 MED ORDER — AMITRIPTYLINE HCL 25 MG PO TABS
25.0000 mg | ORAL_TABLET | Freq: Every day | ORAL | Status: DC
  Administered 2022-12-10: 25 mg via ORAL
  Filled 2022-12-09 (×2): qty 1

## 2022-12-09 MED ORDER — ONDANSETRON HCL 4 MG/2ML IJ SOLN: 4.0000 mg | Freq: Four times a day (QID) | INTRAMUSCULAR | Status: DC | PRN

## 2022-12-09 MED ORDER — HYDROCODONE-ACETAMINOPHEN 5-325 MG PO TABS
1.0000 | ORAL_TABLET | ORAL | Status: DC | PRN
  Administered 2022-12-10 – 2022-12-11 (×3): 2 via ORAL
  Filled 2022-12-09 (×3): qty 2

## 2022-12-09 MED ORDER — IOHEXOL 300 MG/ML  SOLN
100.0000 mL | Freq: Once | INTRAMUSCULAR | Status: AC | PRN
  Administered 2022-12-09: 100 mL via INTRAVENOUS

## 2022-12-09 MED ORDER — PIPERACILLIN-TAZOBACTAM 3.375 G IVPB
3.3750 g | Freq: Three times a day (TID) | INTRAVENOUS | Status: DC
  Administered 2022-12-10 – 2022-12-11 (×4): 3.375 g via INTRAVENOUS
  Filled 2022-12-09 (×5): qty 50

## 2022-12-09 MED ORDER — SODIUM CHLORIDE 0.9 % IV SOLN: Freq: Once | INTRAVENOUS | Status: DC

## 2022-12-09 MED ORDER — METRONIDAZOLE 500 MG/100ML IV SOLN: 500.0000 mg | Freq: Three times a day (TID) | INTRAVENOUS | Status: DC

## 2022-12-09 MED ORDER — ONDANSETRON 4 MG PO TBDP: 4.0000 mg | ORAL_TABLET | Freq: Four times a day (QID) | ORAL | Status: DC | PRN

## 2022-12-09 MED ORDER — ENOXAPARIN SODIUM 40 MG/0.4ML IJ SOSY
40.0000 mg | PREFILLED_SYRINGE | INTRAMUSCULAR | Status: DC
  Administered 2022-12-10 (×2): 40 mg via SUBCUTANEOUS
  Filled 2022-12-09 (×2): qty 0.4

## 2022-12-09 MED ORDER — SODIUM CHLORIDE 0.9 % IV SOLN: INTRAVENOUS | Status: DC

## 2022-12-09 NOTE — ED Provider Notes (Signed)
[facility] Provider Note    Event Date/Time   First MD Initiated Contact with Patient 12/09/22 1945     (approximate)   History   Abdominal Pain   HPI  Tracey Lester is a 52 y.o. female  here with abdominal pain. Pt reports that over the past 2 days she has had aching, gnawing, rlq abd pain. Pain woke her up for sleep and has been persistent. Worse with eating, palpation. No alleviating factors. No fevers. She had some n/v with it initially and has had poor appetite. No diarrhea. No known sick contacts or suspicious food intake.        Physical Exam   Triage Vital Signs: ED Triage Vitals  Enc Vitals Group     BP 12/09/22 1853 116/78     Pulse Rate 12/09/22 1853 (!) 103     Resp 12/09/22 1853 16     Temp 12/09/22 1853 98.2 F (36.8 C)     Temp Source 12/09/22 1853 Oral     SpO2 12/09/22 1853 97 %     Weight 12/09/22 1852 167 lb 8.8 oz (76 kg)     Height 12/09/22 1852 5' 10"$  (1.778 m)     Head Circumference --      Peak Flow --      Pain Score 12/09/22 1852 9     Pain Loc --      Pain Edu? --      Excl. in [place]? --     Most recent vital signs: Vitals:   12/09/22 2245 12/09/22 2300  BP: 127/80 113/73  Pulse: 80 86  Resp: 17 13  Temp: 98.1 F (36.7 C)   SpO2: 98% 94%     General: Awake, no distress.  CV:  Good peripheral perfusion. RRR. Resp:  Normal effort. Lungs clear. Abd:  No distention. Moderate RUQ and RLQ TTP. No guarding or rebound. Other:  MMM.   ED Results / Procedures / Treatments   Labs (all labs ordered are listed, but only abnormal results are displayed) Labs Reviewed  COMPREHENSIVE METABOLIC PANEL - Abnormal; Notable for the following components:      Result Value   Glucose, Bld 120 (*)    All other components within normal limits  CBC - Abnormal; Notable for the following components:   WBC 14.6 (*)    RDW 18.5 (*)    All other components within normal limits  URINALYSIS, ROUTINE W REFLEX MICROSCOPIC -  Abnormal; Notable for the following components:   Color, Urine YELLOW (*)    APPearance HAZY (*)    All other components within normal limits  LIPASE, BLOOD  POC URINE PREG, ED     EKG    RADIOLOGY CT A/P: acute appendicitis, no perforation, fluid in endometrial canal   I also independently reviewed and agree with radiologist interpretations.   PROCEDURES:  Critical Care performed: No   MEDICATIONS ORDERED IN ED: Medications  buprenorphine-naloxone (SUBOXONE) 2-0.5 mg per SL tablet 1 tablet (has no administration in time range)  amitriptyline (ELAVIL) tablet 25 mg (25 mg Oral Not Given 12/10/22 0006)  QUEtiapine (SEROQUEL) tablet 25 mg (25 mg Oral Not Given 12/10/22 0006)  sertraline (ZOLOFT) tablet 150 mg (has no administration in time range)  lamoTRIgine (LAMICTAL) tablet 100 mg (has no administration in time range)  piperacillin-tazobactam (ZOSYN) IVPB 3.375 g (3.375 g Intravenous New Bag/Given 12/10/22 0010)  enoxaparin (LOVENOX) injection 40 mg (has no administration in time range)  0.9 %  sodium chloride infusion ( Intravenous New Bag/Given 12/10/22 0008)  acetaminophen (TYLENOL) tablet 650 mg (has no administration in time range)    Or  acetaminophen (TYLENOL) suppository 650 mg (has no administration in time range)  HYDROcodone-acetaminophen (NORCO/VICODIN) 5-325 MG per tablet 1-2 tablet (has no administration in time range)  morphine (PF) 4 MG/ML injection 4 mg (has no administration in time range)  diphenhydrAMINE (BENADRYL) capsule 25 mg (has no administration in time range)    Or  diphenhydrAMINE (BENADRYL) injection 25 mg (has no administration in time range)  ondansetron (ZOFRAN-ODT) disintegrating tablet 4 mg (has no administration in time range)    Or  ondansetron (ZOFRAN) injection 4 mg (has no administration in time range)  morphine (PF) 4 MG/ML injection 4 mg (4 mg Intravenous Given 12/09/22 2128)  ondansetron (ZOFRAN) injection 4 mg (4 mg Intravenous  Given 12/09/22 2128)  iohexol (OMNIPAQUE) 300 MG/ML solution 100 mL (100 mLs Intravenous Contrast Given 12/09/22 2149)  morphine (PF) 4 MG/ML injection 4 mg (4 mg Intravenous Given 12/09/22 2251)  ketorolac (TORADOL) 30 MG/ML injection 15 mg (15 mg Intravenous Given 12/09/22 2251)     IMPRESSION / MDM / ASSESSMENT AND PLAN / ED COURSE  I reviewed the triage vital signs and the nursing notes.                              Differential diagnosis includes, but is not limited to, appendicitis, cholecystitis, UTI, renal colic, pyelo, enteritis, MSK abd wall pain, thoracolumbar radicular pain  Patient's presentation is most consistent with acute presentation with potential threat to life or bodily function.  52 yo F here with RLQ abdominal pain. Labs show moderate leukocytosis. CMP unremarkable. CT obtained, reviewed, shows acute appendicitis. Discussed with Dr. Peyton Najjar - will admit for likely OR in AM. NPO at MN. Rocephin/flagyl ordered with IVF and IV analgesia. Pt updated and is in agreement with this plan.  FINAL CLINICAL IMPRESSION(S) / ED DIAGNOSES   Final diagnoses:  Acute appendicitis with localized peritonitis, without perforation, abscess, or gangrene     Rx / DC Orders   ED Discharge Orders     None        Note:  This document was prepared using Dragon voice recognition software and may include unintentional dictation errors.   Duffy Bruce, MD 12/10/22 [id number]

## 2022-12-09 NOTE — Progress Notes (Deleted)
Established Patient Office Visit  Subjective    Patient ID: Tracey Lester, female    DOB: [birth year]  Age: 52 y.o. MRN: [id number]  CC:  No chief complaint on file.   HPI Tracey Lester presents for follow up and to discuss neck pain.  NECK PAIN FOLLOW UP Diagnosis:  Status: {Blank single:19197::"controlled","uncontrolled","better","worse","exacerbated","stable","resolved"} Treatments attempted: {Blank multiple:19196::"none","rest","ice","heat","APAP","ibuprofen","aleve","muscle relaxer","physical therapy","HEP","OMM"}  Compliant with recommended treatment: {Blank single:19197::"yes","no","average"} Relief with NSAIDs?:  {Blank single:19197::"No NSAIDs Taken","no","mild","moderate","significant"} Location:{Blank multiple:19196::"Right","Left","R>L","L>R","midline"} Duration:{Blank single:19197::"chronic","days","weeks","months"} Severity: {Blank single:19197::"mild","moderate","severe","1/10","2/10","3/10","4/10","5/10","6/10","7/10","8/10","9/10","10/10"} Quality: {Blank multiple:19196::"sharp","dull","aching","burning","cramping","ill-defined","itchy","pressure-like","pulling","shooting","sore","stabbing","tender","tearing","throbbing"} Frequency: {Blank single:19197::"constant","intermittent","occasional","rare","every few minutes","a few times a hour","a few times a day","a few times a week","a few times a month","a few times a year"} Radiation: {Blank single:19197::"none","yes","R arm","L arm","low back","headache"} Aggravating factors: {Blank multiple:19196::"none","lifting","movement","walking","laying","bending","prolonged sitting","coughing","valsalva","Pain increased with coughing/valsalva"} Alleviating factors: {Blank multiple:19196::"nothing","rest","ice","heat","laying","NSAIDs","APAP","narcotics","muscle relaxer"} Weakness:  {Blank single:19197::"yes","no"} Paresthesias / decreased sensation:  {Blank single:19197::"yes","no"}  Fevers:  {Blank  single:19197::"yes","no"}   MDD/GAD/ADHD/Hx of AUD: -Following with Psychiatry, Dr. Shea Evans and [facility], last seen on 09/03/22 -Currently on Lamictal 100 mg, Zoloft 150 mg (just decreased recently), Amitriptyline 25 mg at night for sleep - EKG reviewed 09/04/22. -Participating in counseling - now on Suboxone 2-0.5 mg. Had been using kratom previously, last dose about 1 month ago.  History of Genital Herpes: -Uses Valtrex 500 mg as needed -Last flare in 2019, no current symptoms   Health Maintenance: -Blood work due -Colon cancer screening due  -Pap due -Mammogram due -Tdap due  Outpatient Encounter Medications as of 12/10/2022  Medication Sig   amitriptyline (ELAVIL) 25 MG tablet TAKE 1 TABLET(25 MG) BY MOUTH AT BEDTIME   buprenorphine-naloxone (SUBOXONE) 2-0.5 mg SUBL SL tablet Place 1 tablet under the tongue daily.   Iron, Ferrous Sulfate, 325 (65 Fe) MG TABS Take 325 mg by mouth daily. (Patient taking differently: Take 325 mg by mouth daily. Liquid version)   lamoTRIgine (LAMICTAL) 100 MG tablet Take 1 tablet (100 mg total) by mouth daily.   QUEtiapine (SEROQUEL) 25 MG tablet Take 1-2 tablets as directedat bedtime   sertraline (ZOLOFT) 100 MG tablet Take 1.5 tablets (150 mg total) by mouth daily.   valACYclovir (VALTREX) 500 MG tablet Take 1 tablet (500 mg total) by mouth daily as needed.   No facility-administered encounter medications on file as of 12/10/2022.    Past Medical History:  Diagnosis Date   ADHD (attention deficit hyperactivity disorder)    Anxiety    Depression    Herpes     Past Surgical History:  Procedure Laterality Date   TONSILLECTOMY      Family History  Problem Relation Age of Onset   Anxiety disorder Mother    Depression Mother    Hypertension Mother    Anxiety disorder Father    Depression Father    Hypertension Father    Schizophrenia Paternal Grandmother    Depression Paternal Grandmother    Hearing loss Paternal Grandmother     Hypertension Paternal Grandmother    Depression Maternal Grandmother     Social History   Socioeconomic History   Marital status: Married    Spouse name: michael   Number of children: 3   Years of education: Not on file   Highest education level: Associate degree: occupational, Hotel manager, or vocational program  Occupational History   Not on file  Tobacco Use   Smoking status: Former    Types: Cigarettes    Quit date: 10/08/2006    Years since quitting: 16.1   Smokeless tobacco: Never  Vaping Use   Vaping Use: Never used  Substance and Sexual Activity   Alcohol use: Not Currently    Alcohol/week: 5.0 standard drinks of alcohol    Types: 4 Glasses of wine, 1 Cans of beer per week   Drug use: No   Sexual activity: Yes    Birth control/protection: None  Other Topics Concern   Not on file  Social History Narrative   Not on file   Social Determinants of Health   Financial Resource Strain: Low Risk  (11/12/2022)   Overall Financial Resource Strain (CARDIA)    Difficulty of Paying Living Expenses: Not hard at all  Food Insecurity: No Food Insecurity (11/12/2022)   Hunger Vital Sign    Worried About Running Out of Food in the Last Year: Never true    Ran Out of Food in the Last Year: Never true  Transportation Needs: No Transportation Needs (11/12/2022)   PRAPARE - Hydrologist (Medical): No    Lack of Transportation (Non-Medical): No  Physical Activity: Sufficiently Active (11/12/2022)   Exercise Vital Sign    Days of Exercise per Week: 6 days    Minutes of Exercise per Session: 30 min  Stress: No Stress Concern Present (11/12/2022)   [place]    Feeling of Stress : Only a little  Social Connections: Socially Integrated (11/12/2022)   Social Connection and Isolation Panel [NHANES]    Frequency of Communication with Friends and Family: More than three times a week    Frequency of  Social Gatherings with Friends and Family: More than three times a week    Attends Religious Services: More than 4 times per year    Active Member of [employer] or Organizations: Yes    Attends Archivist Meetings: More than 4 times per year    Marital Status: Married  Human resources officer Violence: Not At Risk (11/12/2022)   Humiliation, Afraid, Rape, and Kick questionnaire    Fear of Current or Ex-Partner: No    Emotionally Abused: No    Physically Abused: No    Sexually Abused: No    Review of Systems  All other systems reviewed and are negative.       Objective    LMP 09/27/2022 (Exact Date)   Physical Exam Constitutional:      Appearance: Normal appearance.  HENT:     Head: Normocephalic and atraumatic.  Eyes:     Conjunctiva/sclera: Conjunctivae normal.  Cardiovascular:     Rate and Rhythm: Normal rate and regular rhythm.  Pulmonary:     Effort: Pulmonary effort is normal.     Breath sounds: Normal breath sounds.  Musculoskeletal:     Right lower leg: No edema.     Left lower leg: No edema.  Skin:    General: Skin is warm and dry.  Neurological:     General: No focal deficit present.     Mental Status: She is alert. Mental status is at baseline.  Psychiatric:        Mood and Affect: Mood normal.        Behavior: Behavior normal.         Assessment & Plan:   1. GAD (generalized anxiety disorder)/High risk medication use: Following with psychiatry, note reviewed from 09/03/2022.  Patient is currently on Lamictal 100 mg, Zoloft 150 mg and amitriptyline 25 mg at night for sleep.  Patient asking if I would be able to prescribe these medications if needed, which I can do  for her.  Patient is also participating in counseling through [facility] which is where she is also getting her Suboxone. Due for screening labs, CBC, CMP today.    - CBC w/Diff/Platelet - COMPLETE METABOLIC PANEL WITH GFR  2. History of herpes genitalis: Stable, no flares since 2019.   Patient does not have Valtrex on hand, will refill today so she can take at the onset of symptoms.  - valACYclovir (VALTREX) 500 MG tablet; Take 1 tablet (500 mg total) by mouth daily as needed.  Dispense: 30 tablet; Refill: 0  3. Lipid screening: Lipid screening due today. - Lipid Profile  4. Encounter for screening mammogram for malignant neoplasm of breast: Mammogram ordered.  - MM 3D SCREEN BREAST BILATERAL; Future  5. Screening for colon cancer: Referral to GI placed for colonoscopy.  - Ambulatory referral to Gastroenterology  6. Encounter for hepatitis C screening test for low risk patient/Screening for HIV without presence of risk factors: Screening tests ordered.   - Hepatitis C Antibody - HIV antibody (with reflex)  7. Need for diphtheria-tetanus-pertussis (Tdap) vaccine: Tdap administered today.  - Tdap vaccine greater than or equal to 7yo IM  No follow-ups on file.   Teodora Medici, DO

## 2022-12-09 NOTE — ED Triage Notes (Signed)
Pt endorses RLQ abd pain since last night that woke her up. Stabbing feeling. Also having n/v.

## 2022-12-10 ENCOUNTER — Observation Stay: Payer: Managed Care, Other (non HMO) | Admitting: Anesthesiology

## 2022-12-10 ENCOUNTER — Other Ambulatory Visit: Payer: Self-pay

## 2022-12-10 ENCOUNTER — Encounter
Admission: EM | Disposition: A | Discharge status: Home / Self Care | Payer: Self-pay | Source: Home / Self Care | Attending: Emergency Medicine

## 2022-12-10 ENCOUNTER — Ambulatory Visit: Payer: Managed Care, Other (non HMO) | Admitting: Internal Medicine

## 2022-12-10 HISTORY — PX: XI ROBOTIC LAPAROSCOPIC ASSISTED APPENDECTOMY: SHX6877

## 2022-12-10 SURGERY — APPENDECTOMY, ROBOT-ASSISTED, LAPAROSCOPIC
Anesthesia: General

## 2022-12-10 MED ORDER — ONDANSETRON HCL 4 MG/2ML IJ SOLN: 4.0000 mg | Freq: Once | INTRAMUSCULAR | Status: DC | PRN

## 2022-12-10 MED ORDER — FENTANYL CITRATE (PF) 100 MCG/2ML IJ SOLN
25.0000 ug | INTRAMUSCULAR | Status: DC | PRN
  Administered 2022-12-10 (×4): 25 ug via INTRAVENOUS

## 2022-12-10 MED ORDER — BUPIVACAINE HCL (PF) 0.5 % IJ SOLN
INTRAMUSCULAR | Status: DC | PRN
  Administered 2022-12-10: 30 mL

## 2022-12-10 MED ORDER — DEXAMETHASONE SODIUM PHOSPHATE 10 MG/ML IJ SOLN
INTRAMUSCULAR | Status: DC | PRN
  Administered 2022-12-10: 10 mg via INTRAVENOUS

## 2022-12-10 MED ORDER — PROPOFOL 10 MG/ML IV BOLUS
INTRAVENOUS | Status: AC
  Filled 2022-12-10: qty 20

## 2022-12-10 MED ORDER — ACETAMINOPHEN 10 MG/ML IV SOLN
INTRAVENOUS | Status: DC | PRN
  Administered 2022-12-10: 1000 mg via INTRAVENOUS

## 2022-12-10 MED ORDER — CEFAZOLIN SODIUM-DEXTROSE 2-4 GM/100ML-% IV SOLN
2.0000 g | Freq: Once | INTRAVENOUS | Status: AC
  Administered 2022-12-10: 2 g via INTRAVENOUS

## 2022-12-10 MED ORDER — DEXMEDETOMIDINE HCL IN NACL 80 MCG/20ML IV SOLN
INTRAVENOUS | Status: DC | PRN
  Administered 2022-12-10: 12 ug via BUCCAL
  Administered 2022-12-10: 8 ug via BUCCAL

## 2022-12-10 MED ORDER — FENTANYL CITRATE (PF) 100 MCG/2ML IJ SOLN
INTRAMUSCULAR | Status: AC
  Filled 2022-12-10: qty 2

## 2022-12-10 MED ORDER — ROCURONIUM BROMIDE 100 MG/10ML IV SOLN
INTRAVENOUS | Status: DC | PRN
  Administered 2022-12-10: 50 mg via INTRAVENOUS
  Administered 2022-12-10: 5 mg via INTRAVENOUS

## 2022-12-10 MED ORDER — LIDOCAINE HCL (PF) 2 % IJ SOLN
INTRAMUSCULAR | Status: AC
  Filled 2022-12-10: qty 5

## 2022-12-10 MED ORDER — KETOROLAC TROMETHAMINE 30 MG/ML IJ SOLN
INTRAMUSCULAR | Status: AC
  Filled 2022-12-10: qty 1

## 2022-12-10 MED ORDER — MIDAZOLAM HCL 2 MG/2ML IJ SOLN
INTRAMUSCULAR | Status: AC
  Filled 2022-12-10: qty 2

## 2022-12-10 MED ORDER — ONDANSETRON HCL 4 MG/2ML IJ SOLN
INTRAMUSCULAR | Status: AC
  Filled 2022-12-10: qty 2

## 2022-12-10 MED ORDER — BUPIVACAINE HCL (PF) 0.5 % IJ SOLN
INTRAMUSCULAR | Status: AC
  Filled 2022-12-10: qty 30

## 2022-12-10 MED ORDER — ROCURONIUM BROMIDE 10 MG/ML (PF) SYRINGE
PREFILLED_SYRINGE | INTRAVENOUS | Status: AC
  Filled 2022-12-10: qty 10

## 2022-12-10 MED ORDER — NEOSTIGMINE METHYLSULFATE 10 MG/10ML IV SOLN
INTRAVENOUS | Status: AC
  Filled 2022-12-10: qty 1

## 2022-12-10 MED ORDER — SUGAMMADEX SODIUM 200 MG/2ML IV SOLN
INTRAVENOUS | Status: DC | PRN
  Administered 2022-12-10: 200 mg via INTRAVENOUS

## 2022-12-10 MED ORDER — ACETAMINOPHEN 10 MG/ML IV SOLN
INTRAVENOUS | Status: AC
  Filled 2022-12-10: qty 100

## 2022-12-10 MED ORDER — ONDANSETRON HCL 4 MG/2ML IJ SOLN
INTRAMUSCULAR | Status: DC | PRN
  Administered 2022-12-10: 4 mg via INTRAVENOUS

## 2022-12-10 MED ORDER — KETOROLAC TROMETHAMINE 30 MG/ML IJ SOLN
30.0000 mg | Freq: Once | INTRAMUSCULAR | Status: AC
  Administered 2022-12-10: 30 mg via INTRAVENOUS

## 2022-12-10 MED ORDER — LACTATED RINGERS IV SOLN: INTRAVENOUS | Status: DC | PRN

## 2022-12-10 MED ORDER — FENTANYL CITRATE (PF) 100 MCG/2ML IJ SOLN
INTRAMUSCULAR | Status: DC | PRN
  Administered 2022-12-10 (×2): 50 ug via INTRAVENOUS

## 2022-12-10 MED ORDER — PROPOFOL 10 MG/ML IV BOLUS
INTRAVENOUS | Status: DC | PRN
  Administered 2022-12-10: 150 mg via INTRAVENOUS

## 2022-12-10 MED ORDER — CEFAZOLIN SODIUM-DEXTROSE 2-4 GM/100ML-% IV SOLN
INTRAVENOUS | Status: AC
  Filled 2022-12-10: qty 100

## 2022-12-10 MED ORDER — DEXAMETHASONE SODIUM PHOSPHATE 10 MG/ML IJ SOLN
INTRAMUSCULAR | Status: AC
  Filled 2022-12-10: qty 1

## 2022-12-10 MED ORDER — MIDAZOLAM HCL 2 MG/2ML IJ SOLN
INTRAMUSCULAR | Status: DC | PRN
  Administered 2022-12-10: 2 mg via INTRAVENOUS

## 2022-12-10 MED ORDER — LIDOCAINE HCL (CARDIAC) PF 100 MG/5ML IV SOSY
PREFILLED_SYRINGE | INTRAVENOUS | Status: DC | PRN
  Administered 2022-12-10: 100 mg via INTRAVENOUS

## 2022-12-10 SURGICAL SUPPLY — 65 items
BAG PRESSURE INF REUSE 1000 (BAG) IMPLANT
BLADE SURG SZ11 CARB STEEL (BLADE) ×1 IMPLANT
CANNULA REDUC XI 12-8 STAPL (CANNULA) ×1
CANNULA REDUCER 12-8 DVNC XI (CANNULA) ×1 IMPLANT
COVER TIP SHEARS 8 DVNC (MISCELLANEOUS) ×1 IMPLANT
COVER TIP SHEARS 8MM DA VINCI (MISCELLANEOUS) ×1
DERMABOND ADVANCED .7 DNX12 (GAUZE/BANDAGES/DRESSINGS) ×1 IMPLANT
DRAPE ARM DVNC X/XI (DISPOSABLE) ×4 IMPLANT
DRAPE COLUMN DVNC XI (DISPOSABLE) ×1 IMPLANT
DRAPE DA VINCI XI ARM (DISPOSABLE) ×4
DRAPE DA VINCI XI COLUMN (DISPOSABLE) ×1
ELECT REM PT RETURN 9FT ADLT (ELECTROSURGICAL) ×1
ELECTRODE REM PT RTRN 9FT ADLT (ELECTROSURGICAL) ×1 IMPLANT
GLOVE BIOGEL PI IND STRL 6.5 (GLOVE) ×2 IMPLANT
GLOVE SURG SYN 6.5 ES PF (GLOVE) ×3 IMPLANT
GLOVE SURG SYN 6.5 PF PI (GLOVE) ×2 IMPLANT
GOWN STRL REUS W/ TWL LRG LVL3 (GOWN DISPOSABLE) ×3 IMPLANT
GOWN STRL REUS W/TWL LRG LVL3 (GOWN DISPOSABLE) ×3
GRASPER SUT TROCAR 14GX15 (MISCELLANEOUS) IMPLANT
IRRIGATOR SUCT 8 DISP DVNC XI (IRRIGATION / IRRIGATOR) IMPLANT
IRRIGATOR SUCTION 8MM XI DISP (IRRIGATION / IRRIGATOR)
IV NS 1000ML (IV SOLUTION)
IV NS 1000ML BAXH (IV SOLUTION) IMPLANT
KIT PINK PAD W/HEAD ARE REST (MISCELLANEOUS) ×1 IMPLANT
KIT PINK PAD W/HEAD ARM REST (MISCELLANEOUS) ×1 IMPLANT
LABEL OR SOLS (LABEL) IMPLANT
MANIFOLD NEPTUNE II (INSTRUMENTS) ×1 IMPLANT
NDL INSUFFLATION 14GA 120MM (NEEDLE) ×1 IMPLANT
NEEDLE HYPO 22GX1.5 SAFETY (NEEDLE) ×1 IMPLANT
NEEDLE INSUFFLATION 14GA 120MM (NEEDLE) ×1 IMPLANT
OBTURATOR OPTICAL STANDARD 8MM (TROCAR) ×1
OBTURATOR OPTICAL STND 8 DVNC (TROCAR) ×1
OBTURATOR OPTICALSTD 8 DVNC (TROCAR) ×1 IMPLANT
PACK LAP CHOLECYSTECTOMY (MISCELLANEOUS) ×1 IMPLANT
RELOAD STAPLE 45 2.5 WHT DVNC (STAPLE) IMPLANT
RELOAD STAPLE 45 3.5 BLU DVNC (STAPLE) IMPLANT
RELOAD STAPLER 2.5X45 WHT DVNC (STAPLE) IMPLANT
RELOAD STAPLER 3.5X45 BLU DVNC (STAPLE) IMPLANT
SEAL CANN UNIV 5-8 DVNC XI (MISCELLANEOUS) ×3 IMPLANT
SEAL XI 5MM-8MM UNIVERSAL (MISCELLANEOUS) ×3
SEALER VESSEL DA VINCI XI (MISCELLANEOUS)
SEALER VESSEL EXT DVNC XI (MISCELLANEOUS) IMPLANT
SET TUBE SMOKE EVAC HIGH FLOW (TUBING) ×1 IMPLANT
SOL ELECTROSURG ANTI STICK (MISCELLANEOUS) ×1
SOLUTION ELECTROSURG ANTI STCK (MISCELLANEOUS) ×1 IMPLANT
SPONGE T-LAP 4X18 ~~LOC~~+RFID (SPONGE) ×1 IMPLANT
STAPLER 45 DA VINCI SURE FORM (STAPLE)
STAPLER 45 SUREFORM DVNC (STAPLE) IMPLANT
STAPLER CANNULA SEAL DVNC XI (STAPLE) ×1 IMPLANT
STAPLER CANNULA SEAL XI (STAPLE) ×1
STAPLER RELOAD 2.5X45 WHITE (STAPLE)
STAPLER RELOAD 2.5X45 WHT DVNC (STAPLE)
STAPLER RELOAD 3.5X45 BLU DVNC (STAPLE)
STAPLER RELOAD 3.5X45 BLUE (STAPLE)
SUT MNCRL AB 4-0 PS2 18 (SUTURE) ×1 IMPLANT
SUT VIC AB 2-0 SH 27 (SUTURE) ×1
SUT VIC AB 2-0 SH 27XBRD (SUTURE) ×1 IMPLANT
SUT VICRYL 0 UR6 27IN ABS (SUTURE) ×1 IMPLANT
SUT VLOC 90 6 CV-15 VIOLET (SUTURE) ×1 IMPLANT
SYR 30ML LL (SYRINGE) ×1 IMPLANT
SYS BAG RETRIEVAL 10MM (BASKET) ×1
SYSTEM BAG RETRIEVAL 10MM (BASKET) ×1 IMPLANT
TRAP FLUID SMOKE EVACUATOR (MISCELLANEOUS) ×1 IMPLANT
TRAY FOLEY MTR SLVR 16FR STAT (SET/KITS/TRAYS/PACK) IMPLANT
WATER STERILE IRR 500ML POUR (IV SOLUTION) ×1 IMPLANT

## 2022-12-10 NOTE — Anesthesia Postprocedure Evaluation (Signed)
Anesthesia Post Note  Patient: Tracey Lester  Procedure(s) Performed: XI ROBOTIC LAPAROSCOPIC ASSISTED APPENDECTOMY  Patient location during evaluation: PACU Anesthesia Type: General Level of consciousness: awake Pain management: pain level controlled Respiratory status: spontaneous breathing Cardiovascular status: stable Anesthetic complications: no   No notable events documented.   Last Vitals:  Vitals:   12/10/22 1415 12/10/22 1420  BP: 114/74   Pulse: 85 89  Resp: (!) 6 (!) 21  Temp:    SpO2: 93% 96%    Last Pain:  Vitals:   12/10/22 1420  TempSrc:   PainSc: 8                  VAN STAVEREN,Kendrew Paci

## 2022-12-10 NOTE — H&P (Signed)
SURGICAL CONSULTATION NOTE   HISTORY OF PRESENT ILLNESS (HPI):  52 y.o. female presented to [facility] ED for evaluation of abdominal pain. Patient reports started with periumbilical abdominal pain.  Then pain radiated to the right lower quadrant.  Pain aggravated by applying pressure and palpation.  Patient denies any fever or chills.  Patient endorses some nausea and vomiting.  At the ED she was found with tenderness to palpation in the right lower quadrant.  There was a leukocytosis.  CT scan of the abdomen and pelvis confirm acute appendicitis.  I personally evaluated the images.  Surgery is consulted by Dr. Bland Span in this context for evaluation and management of acute appendicitis.  PAST MEDICAL HISTORY (PMH):  Past Medical History:  Diagnosis Date   ADHD (attention deficit hyperactivity disorder)    Anxiety    Depression    Herpes      PAST SURGICAL HISTORY ([place]):  Past Surgical History:  Procedure Laterality Date   TONSILLECTOMY       MEDICATIONS:  Prior to Admission medications   Medication Sig Start Date End Date Taking? Authorizing Provider  amitriptyline (ELAVIL) 50 MG tablet Take 50 mg by mouth at bedtime. 12/01/22  Yes [provider]  buprenorphine-naloxone (SUBOXONE) 2-0.5 mg SUBL SL tablet Place 1 tablet under the tongue daily.   Yes [provider]  ferrous sulfate 220 (44 Fe) MG/5ML solution Take 220 mg by mouth every other day.   Yes [provider]  lamoTRIgine (LAMICTAL) 100 MG tablet Take 1 tablet (100 mg total) by mouth daily. 09/22/22  Yes Ursula Alert, MD  sertraline (ZOLOFT) 100 MG tablet Take 1.5 tablets (150 mg total) by mouth daily. Patient taking differently: Take 100 mg by mouth daily. 09/11/22  Yes Eappen, Ria Clock, MD  amitriptyline (ELAVIL) 25 MG tablet TAKE 1 TABLET(25 MG) BY MOUTH AT BEDTIME Patient not taking: Reported on 12/09/2022 12/07/22   Ursula Alert, MD  Iron, Ferrous Sulfate, 325 (65 Fe) MG TABS Take 325 mg by  mouth daily. Patient not taking: Reported on 12/09/2022 10/28/22   Teodora Medici, DO  QUEtiapine (SEROQUEL) 25 MG tablet Take 1-2 tablets as directedat bedtime Patient not taking: Reported on 12/09/2022 11/23/22   Dara Hoyer, PA-C  valACYclovir (VALTREX) 500 MG tablet Take 1 tablet (500 mg total) by mouth daily as needed. 10/23/22   Teodora Medici, DO     ALLERGIES:  Allergies  Allergen Reactions   Codeine Itching     SOCIAL HISTORY:  Social History   Socioeconomic History   Marital status: Married    Spouse name: michael   Number of children: 3   Years of education: Not on file   Highest education level: Associate degree: occupational, Hotel manager, or vocational program  Occupational History   Not on file  Tobacco Use   Smoking status: Former    Types: Cigarettes    Quit date: 10/08/2006    Years since quitting: 16.1   Smokeless tobacco: Never  Vaping Use   Vaping Use: Never used  Substance and Sexual Activity   Alcohol use: Not Currently    Alcohol/week: 5.0 standard drinks of alcohol    Types: 4 Glasses of wine, 1 Cans of beer per week   Drug use: No   Sexual activity: Yes    Birth control/protection: None  Other Topics Concern   Not on file  Social History Narrative   Not on file   Social Determinants of Health   Financial Resource Strain: Low Risk  (11/12/2022)  Overall Financial Resource Strain (CARDIA)    Difficulty of Paying Living Expenses: Not hard at all  Food Insecurity: No Food Insecurity (12/10/2022)   Hunger Vital Sign    Worried About Running Out of Food in the Last Year: Never true    Ran Out of Food in the Last Year: Never true  Transportation Needs: No Transportation Needs (12/10/2022)   PRAPARE - Hydrologist (Medical): No    Lack of Transportation (Non-Medical): No  Physical Activity: Sufficiently Active (11/12/2022)   Exercise Vital Sign    Days of Exercise per Week: 6 days    Minutes of Exercise per  Session: 30 min  Stress: No Stress Concern Present (11/12/2022)   Gay    Feeling of Stress : Only a little  Social Connections: Socially Integrated (11/12/2022)   Social Connection and Isolation Panel [NHANES]    Frequency of Communication with Friends and Family: More than three times a week    Frequency of Social Gatherings with Friends and Family: More than three times a week    Attends Religious Services: More than 4 times per year    Active Member of Clubs or Organizations: Yes    Attends Archivist Meetings: More than 4 times per year    Marital Status: Married  Human resources officer Violence: Not At Risk (12/10/2022)   Humiliation, Afraid, Rape, and Kick questionnaire    Fear of Current or Ex-Partner: No    Emotionally Abused: No    Physically Abused: No    Sexually Abused: No      FAMILY HISTORY:  Family History  Problem Relation Age of Onset   Anxiety disorder Mother    Depression Mother    Hypertension Mother    Anxiety disorder Father    Depression Father    Hypertension Father    Schizophrenia Paternal Grandmother    Depression Paternal Grandmother    Hearing loss Paternal Grandmother    Hypertension Paternal Grandmother    Depression Maternal Grandmother      REVIEW OF SYSTEMS:  Constitutional: denies weight loss, fever, chills, or sweats  Eyes: denies any other vision changes, history of eye injury  ENT: denies sore throat, hearing problems  Respiratory: denies shortness of breath, wheezing  Cardiovascular: denies chest pain, palpitations  Gastrointestinal: positive abdominal pain, nausea and vomiting Genitourinary: denies burning with urination or urinary frequency Musculoskeletal: denies any other joint pains or cramps  Skin: denies any other rashes or skin discolorations  Neurological: denies any other headache, dizziness, weakness  Psychiatric: denies any other depression,  anxiety   All other review of systems were negative   VITAL SIGNS:  Temp:  [98 F (36.7 C)-98.2 F (36.8 C)] 98.1 F (36.7 C) (02/15 0554) Pulse Rate:  [68-103] 71 (02/15 0554) Resp:  [10-18] 16 (02/15 0554) BP: (96-127)/(60-80) 105/60 (02/15 0554) SpO2:  [94 %-98 %] 95 % (02/15 0554) Weight:  [76 kg] 76 kg (02/14 1852)     Height: 5' 10"$  (177.8 cm) Weight: 76 kg BMI (Calculated): 24.04   INTAKE/OUTPUT:  This shift: No intake/output data recorded.  Last 2 shifts: @IOLAST2SHIFTS$ @   PHYSICAL EXAM:  Constitutional:  -- Normal body habitus  -- Awake, alert, and oriented x3  Eyes:  -- Pupils equally round and reactive to light  -- No scleral icterus  Ear, nose, and throat:  -- No jugular venous distension  Pulmonary:  -- No crackles  -- Equal  breath sounds bilaterally -- Breathing non-labored at rest Cardiovascular:  -- S1, S2 present  -- No pericardial rubs Gastrointestinal:  -- Abdomen soft, tender to palpation in the right lower quadrant, non-distended, no guarding or rebound tenderness -- No abdominal masses appreciated, pulsatile or otherwise  Musculoskeletal and Integumentary:  -- Wounds or skin discoloration: None appreciated -- Extremities: B/L UE and LE FROM, hands and feet warm, no edema  Neurologic:  -- Motor function: intact and symmetric -- Sensation: intact and symmetric   Labs:     Latest Ref Rng & Units 12/09/2022    6:55 PM 10/23/2022    4:05 PM 11/28/2018    8:31 AM  CBC  WBC 4.0 - 10.5 K/uL 14.6  5.1  5.5   Hemoglobin 12.0 - 15.0 g/dL 13.4  11.3  12.8   Hematocrit 36.0 - 46.0 % 39.7  34.0  38.4   Platelets 150 - 400 K/uL 267  307  287.0       Latest Ref Rng & Units 12/09/2022    6:55 PM 10/23/2022    4:05 PM 11/28/2018    8:31 AM  CMP  Glucose 70 - 99 mg/dL 120  78  84   BUN 6 - 20 mg/dL 12  13  8   $ Creatinine 0.44 - 1.00 mg/dL 0.70  0.64  0.74   Sodium 135 - 145 mmol/L 135  139  138   Potassium 3.5 - 5.1 mmol/L 3.5  4.0  4.6   Chloride 98  - 111 mmol/L 100  104  105   CO2 22 - 32 mmol/L 25  28  27   $ Calcium 8.9 - 10.3 mg/dL 9.5  9.6  9.8   Total Protein 6.5 - 8.1 g/dL 6.7  6.3  6.3   Total Bilirubin 0.3 - 1.2 mg/dL 1.0  0.3  0.4   Alkaline Phos 38 - 126 U/L 71   62   AST 15 - 41 U/L 21  19  16   $ ALT 0 - 44 U/L 17  13  13     $ Imaging studies:  EXAM: CT ABDOMEN AND PELVIS WITH CONTRAST   TECHNIQUE: Multidetector CT imaging of the abdomen and pelvis was performed using the standard protocol following bolus administration of intravenous contrast.   RADIATION DOSE REDUCTION: This exam was performed according to the departmental dose-optimization program which includes automated exposure control, adjustment of the mA and/or kV according to patient size and/or use of iterative reconstruction technique.   CONTRAST:  123m OMNIPAQUE IOHEXOL 300 MG/ML  SOLN   COMPARISON:  None Available.   FINDINGS: Lower chest: No acute abnormality.   Hepatobiliary: No focal liver abnormality is seen. No gallstones, gallbladder wall thickening, or biliary dilatation.   Pancreas: Unremarkable. No pancreatic ductal dilatation or surrounding inflammatory changes.   Spleen: Normal in size without focal abnormality.   Adrenals/Urinary Tract: Adrenal glands are unremarkable. Kidneys are normal, without renal calculi, focal lesion, or hydronephrosis. Bladder is unremarkable.   Stomach/Bowel: Stomach is within normal limits. There is a dilated tubular structure in the right lower quadrant which is not completely visualized, however there are surrounding inflammatory changes and findings are most concerning for acute appendicitis (series 2 images 42-52). No adjacent fluid collection or abscess. Bowel loops are normal in caliber. No evidence of colitis or diverticulitis.   Vascular/Lymphatic: No significant vascular findings are present. No enlarged abdominal or pelvic lymph nodes.   Reproductive: Uterus is normal in size with fluid  in the endometrial  canal right adnexal cyst. Left ovary is unremarkable.   Other: No abdominal wall hernia or abnormality. No abdominopelvic ascites.   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   IMPRESSION: 1. Dilated tubular structure in the right lower quadrant which is not completely visualized, however there are surrounding inflammatory changes and findings are most concerning for acute appendicitis. No adjacent fluid collection or abscess. 2. No evidence of colitis or diverticulitis. 3. Small amount of fluid in the endometrial canal with right adnexal cysts. Pelvic sonogram for further evaluation is recommended, when patient can tolerate the examination. 4. 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   Findings concerning for acute appendicitis were called by telephone at the time of interpretation on 12/09/2022 at 10:16 pm to provider Duffy Bruce , who verbally acknowledged these results.     Electronically Signed   By: Keane Police D.O.   On: 12/09/2022 22:16.  Assessment/Plan:  52 y.o. female with acute appendicitis.   Patient with history, physical exam and images consistent with acute appendicitis. Patient oriented about diagnosis and surgical management as treatment. Patient oriented about goals of surgery and its risk including: bowel injury, infection, abscess, bleeding, leak from cecum, intestinal adhesions, bowel obstruction, fistula, injury to the ureter among others.  Patient understood and agreed to proceed with surgery. Will admit patient, already started on antibiotic therapy, will give IV hydration since patient is NPO and schedule to OR.   Arnold Long, MD

## 2022-12-10 NOTE — Transfer of Care (Signed)
Immediate Anesthesia Transfer of Care Note  Patient: Tracey Lester  Procedure(s) Performed: XI ROBOTIC LAPAROSCOPIC ASSISTED APPENDECTOMY  Patient Location: PACU  Anesthesia Type:General  Level of Consciousness: drowsy  Airway & Oxygen Therapy: Patient Spontanous Breathing and Patient connected to nasal cannula oxygen  Post-op Assessment: Report given to RN and Post -op Vital signs reviewed and stable  Post vital signs: Reviewed and stable  Last Vitals:  Vitals Value Taken Time  BP 112/76 12/10/22 1405  Temp 37.1 C 12/10/22 1405  Pulse 77 12/10/22 1408  Resp 14 12/10/22 1408  SpO2 96 % 12/10/22 1408  Vitals shown include unvalidated device data.  Last Pain:  Vitals:   12/10/22 1405  TempSrc:   PainSc: Asleep      Patients Stated Pain Goal: 0 ([id number])  Complications: No notable events documented.

## 2022-12-10 NOTE — Anesthesia Procedure Notes (Signed)
Procedure Name: Intubation Date/Time: 12/10/2022 12:55 PM  Performed by: Dshaun Reppucci, [place], CRNAPre-anesthesia Checklist: Patient identified, Patient being monitored, Timeout performed, Emergency Drugs available and Suction available Patient Re-evaluated:Patient Re-evaluated prior to induction Oxygen Delivery Method: Circle system utilized Preoxygenation: Pre-oxygenation with 100% oxygen Induction Type: IV induction Ventilation: Mask ventilation without difficulty Laryngoscope Size: 3 and McGraph Grade View: Grade I Tube type: Oral Tube size: 7.0 mm Number of attempts: 1 Airway Equipment and Method: Stylet and Video-laryngoscopy Placement Confirmation: ETT inserted through vocal cords under direct vision, positive ETCO2 and breath sounds checked- equal and bilateral Secured at: 21 cm Tube secured with: Tape Dental Injury: Teeth and Oropharynx as per pre-operative assessment

## 2022-12-10 NOTE — Anesthesia Preprocedure Evaluation (Signed)
Anesthesia Evaluation  Patient identified by MRN, date of birth, ID band Patient awake    Reviewed: Allergy & Precautions, NPO status , Patient's Chart, lab work & pertinent test results  Airway Mallampati: II  TM Distance: >3 FB Neck ROM: full    Dental  (+) Teeth Intact   Pulmonary neg pulmonary ROS, Patient abstained from smoking., former smoker   Pulmonary exam normal breath sounds clear to auscultation       Cardiovascular Exercise Tolerance: Good negative cardio ROS Normal cardiovascular exam Rhythm:Regular     Neuro/Psych   Anxiety Depression    negative neurological ROS  negative psych ROS   GI/Hepatic negative GI ROS, Neg liver ROS,,,  Endo/Other  negative endocrine ROS    Renal/GU negative Renal ROS     Musculoskeletal   Abdominal Normal abdominal exam  (+)   Peds negative pediatric ROS (+)  Hematology negative hematology ROS (+)   Anesthesia Other Findings Past Medical History: No date: ADHD (attention deficit hyperactivity disorder) No date: Anxiety No date: Depression No date: Herpes  Past Surgical History: No date: TONSILLECTOMY  BMI    Body Mass Index: 24.04 kg/m      Reproductive/Obstetrics negative OB ROS                             Anesthesia Physical Anesthesia Plan  ASA: 2  Anesthesia Plan: General   Post-op Pain Management:    Induction: Intravenous  PONV Risk Score and Plan: 1 and Ondansetron and Dexamethasone  Airway Management Planned: Oral ETT  Additional Equipment:   Intra-op Plan:   Post-operative Plan: Extubation in OR  Informed Consent: I have reviewed the patients History and Physical, chart, labs and discussed the procedure including the risks, benefits and alternatives for the proposed anesthesia with the patient or authorized representative who has indicated his/her understanding and acceptance.     Dental Advisory Given  Plan  Discussed with: CRNA and Surgeon  Anesthesia Plan Comments:        Anesthesia Quick Evaluation

## 2022-12-10 NOTE — Op Note (Signed)
Pre-op Diagnosis: Acute appendicitis   Post op Diagnosis: Acute appenditicis  Procedure: Robotic assisted laparoscopic appendectomy.  Anesthesia: GETA  Surgeon: Herbert Pun, MD, FACS  Wound Classification: clean contaminated  Specimen: Appendix  Complications: None  Estimated Blood Loss: 3 mL   Indications: Patient is a 52 y.o. female  presented with above right lower quadrant pain. CT scan shows acute appendicitis.     FIndings: 1.  Suppurative tip of appendix.  2. No peri-appendiceal abscess or phlegmon 3. Normal anatomy 4. Adequate hemostasis.        Description of procedure: The patient was placed on the operating table in the supine position. General anesthesia was induced. A time-out was completed verifying correct patient, procedure, site, positioning, and implant(s) and/or special equipment prior to beginning this procedure. The abdomen was prepped and draped in the usual sterile fashion.   Palmer's point located and Veress needle was inserted.  After confirming 2 clicks and a positive saline drop test, gas insufflation was initiated until the abdominal pressure was measured at 15 mmHg.  Afterwards, the Veress needle was removed and a 8 mm port was placed in left upper quadrant area using Optiview technique.  After local was infused, 3 additional incision on the left abdominal wall were made 5 cm apart.  An 12 mm port and two other 8 mm ports were placed under direct visualization.  No injuries from trocar placements were noted.  The table was placed in the Trendelenburg position with the right side elevated.  With the use of Tip up grasper, fenestrated bipolar and monopolar scissors, an inflamed appendix was identified and elevated.  Window created at base of appendix in the mesentery.    The mesoappendix was divided with combination of bipolar energy and monopolar scissors.  The base of the appendix was ligated with 3-0 V-Loc.  The appendix was divided with  monopolar scissors.  A second layer of the 3 oh V-Loc was done over the appendiceal stump.  The appendiceal stump was examined and hemostasis noted. No other pathology was identified within pelvis. The 12 mm trocar removed and port site closed with PMI using 0 vicryl under direct vision. Remaining trocars were removed under direct vision. No bleeding was noted.The abdomen was allowed to collapse.  All skin incisions then closed with subcuticular sutures Monocryl 4-0.  Wounds then dressed with dermabond.  The patient tolerated the procedure well, awakened from anesthesia and was taken to the postanesthesia care unit in satisfactory condition.  Sponge count and instrument count correct at the end of the procedure.

## 2022-12-10 NOTE — ED Notes (Signed)
Order clarification with Sharion Settler, NP regarding Lovenox injection. Instructed to give

## 2022-12-11 ENCOUNTER — Ambulatory Visit (HOSPITAL_COMMUNITY): Payer: Managed Care, Other (non HMO) | Admitting: Licensed Clinical Social Worker

## 2022-12-11 ENCOUNTER — Encounter: Payer: Self-pay | Admitting: General Surgery

## 2022-12-11 MED ORDER — HYDROCODONE-ACETAMINOPHEN 5-325 MG PO TABS: 1.0000 | ORAL_TABLET | ORAL | 0 refills | Status: AC | PRN

## 2022-12-11 NOTE — Discharge Summary (Signed)
  Patient ID: Tracey Lester MRN: [id number] DOB/AGE: 12/09/1970 52 y.o.  Admit date: 12/09/2022 Discharge date: 12/11/2022   Discharge Diagnoses:  Principal Problem:   Acute appendicitis with localized peritonitis   Procedures: Robotic assisted laparoscopic appendectomy  Hospital Course: Patient admitted with acute appendicitis.  She underwent robotic appendectomy.  She tolerated the procedure well.  The pain is slowly improving.  The wounds are healing well.  Patient tolerating diet.  Patient ambulating.  No sign of complication at this moment.  Physical Exam Vitals reviewed.  HENT:     Head: Normocephalic.  Cardiovascular:     Rate and Rhythm: Normal rate and regular rhythm.  Pulmonary:     Effort: Pulmonary effort is normal.     Breath sounds: Normal breath sounds.  Abdominal:     General: Abdomen is flat. Bowel sounds are normal.     Palpations: Abdomen is soft.     Tenderness: There is no abdominal tenderness.  Neurological:     Mental Status: She is alert and oriented to person, place, and time.      Consults: None  Disposition: Discharge disposition: 01-Home or Self Care       Discharge Instructions     Diet - low sodium heart healthy   Complete by: As directed    Increase activity slowly   Complete by: As directed       Allergies as of 12/11/2022       Reactions   Codeine Itching        Medication List     TAKE these medications    amitriptyline 50 MG tablet Commonly known as: ELAVIL Take 50 mg by mouth at bedtime.   amitriptyline 25 MG tablet Commonly known as: ELAVIL TAKE 1 TABLET(25 MG) BY MOUTH AT BEDTIME   buprenorphine-naloxone 2-0.5 mg Subl SL tablet Commonly known as: SUBOXONE Place 1 tablet under the tongue daily.   HYDROcodone-acetaminophen 5-325 MG tablet Commonly known as: Norco Take 1 tablet by mouth every 4 (four) hours as needed for up to 3 days for moderate pain.   ferrous sulfate 220 (44 Fe) MG/5ML  solution Take 220 mg by mouth every other day.   Iron (Ferrous Sulfate) 325 (65 Fe) MG Tabs Take 325 mg by mouth daily.   lamoTRIgine 100 MG tablet Commonly known as: LAMICTAL Take 1 tablet (100 mg total) by mouth daily.   QUEtiapine 25 MG tablet Commonly known as: SEROQUEL Take 1-2 tablets as directedat bedtime   sertraline 100 MG tablet Commonly known as: ZOLOFT Take 1.5 tablets (150 mg total) by mouth daily. What changed: how much to take   valACYclovir 500 MG tablet Commonly known as: Valtrex Take 1 tablet (500 mg total) by mouth daily as needed.        Follow-up Information     Herbert Pun, MD Follow up in 2 week(s).   Specialty: General Surgery Why: follow up after appendectomy Contact information: [address] [place] [address] [phone]

## 2022-12-11 NOTE — TOC Initial Note (Signed)
Transition of Care [facility]) - Initial/Assessment Note    Patient Details  Name: Tracey Lester MRN: [id number] Date of Birth: [birth year]  Transition of Care [facility]) CM/SW Contact:    Laurena Slimmer, RN Phone Number: 12/11/2022, 12:04 PM  Clinical Narrative:                  Transition of Care [facility]) Screening Note   Patient Details  Name: Tracey Lester Date of Birth: 1971/02/11   Transition of Care [facility]) CM/SW Contact:    Laurena Slimmer, RN Phone Number: 12/11/2022, 12:04 PM    Transition of Care Department [facility]) has reviewed patient and no TOC needs have been identified at this time. We will continue to monitor patient advancement through interdisciplinary progression rounds. If new patient transition needs arise, please place a TOC consult.          Patient Goals and CMS Choice            Expected Discharge Plan and Services         Expected Discharge Date: 12/11/22                                    Prior Living Arrangements/Services                       Activities of Daily Living Home Assistive Devices/Equipment: None ADL Screening (condition at time of admission) Patient's cognitive ability adequate to safely complete daily activities?: Yes Is the patient deaf or have difficulty hearing?: No Does the patient have difficulty seeing, even when wearing glasses/contacts?: No Does the patient have difficulty concentrating, remembering, or making decisions?: No Patient able to express need for assistance with ADLs?: Yes Does the patient have difficulty dressing or bathing?: No Independently performs ADLs?: Yes (appropriate for developmental age) Does the patient have difficulty walking or climbing stairs?: No Weakness of Legs: None Weakness of Arms/Hands: None  Permission Sought/Granted                  Emotional Assessment              Admission diagnosis:  Acute appendicitis with localized peritonitis [K35.30] Acute  appendicitis with localized peritonitis, without perforation, abscess, or gangrene [K35.30] Patient Active Problem List   Diagnosis Date Noted   Acute appendicitis with localized peritonitis 12/09/2022   At risk for prolonged QT interval syndrome 09/03/2022   Other psychoactive substance use, 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 ([place]) 08/10/2022   History of alcohol abuse 08/10/2022   Insomnia due to medical condition 07/21/2022   Other psychoactive substance use, unsp with withdrawal, unsp ([place]) 07/21/2022   MDD (major depressive disorder), recurrent, severe, with psychosis ([place]) 09/17/2020   MDD (major depressive disorder), recurrent, in full remission ([place]) 04/22/2020   High risk medication use 04/22/2020   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   GAD (generalized anxiety disorder) 05/31/2019   MDD (major depressive disorder), recurrent episode, mild ([place]) 05/31/2019   Insomnia due to mental disorder 05/31/2019   Family history of celiac disease 11/28/2018   Generalized abdominal pain 11/28/2018   Alternating constipation and diarrhea 11/28/2018   PCP:  Teodora Medici, DO Pharmacy:   [id number] [place], [place] - [place] AT [facility] [address] [place] [address] Phone: [phone] Fax: [place] [id number] -  [place], [place] - [address] [place] [address] Phone: [phone] Fax: [phone]     Social Determinants of Health (SDOH) Social History: SDOH Screenings   Food Insecurity: No Food Insecurity (12/10/2022)  Housing: Low Risk  (12/10/2022)  Transportation Needs: No Transportation Needs (12/10/2022)  Utilities: Not At Risk (12/10/2022)  Depression (PHQ2-9): Low Risk  (11/12/2022)  Recent Concern: Depression (PHQ2-9) - Medium Risk (09/03/2022)  Financial Resource Strain: Low Risk  (11/12/2022)  Physical Activity: Sufficiently  Active (11/12/2022)  Social Connections: Socially Integrated (11/12/2022)  Stress: No Stress Concern Present (11/12/2022)  Tobacco Use: Medium Risk (12/11/2022)   SDOH Interventions:     Readmission Risk Interventions     No data to display

## 2022-12-11 NOTE — Discharge Instructions (Signed)

## 2022-12-11 NOTE — Progress Notes (Signed)
Pt discharged per MD order. IV removed. Discharge instructions reviewed with pt. Pt verbalized understanding. All questions answered to pt satisfaction. Pt taken out in wheelchair by staff.

## 2022-12-14 ENCOUNTER — Encounter (HOSPITAL_COMMUNITY): Payer: Self-pay

## 2022-12-14 ENCOUNTER — Telehealth: Payer: Self-pay

## 2022-12-14 ENCOUNTER — Telehealth (HOSPITAL_COMMUNITY): Payer: Self-pay | Admitting: Licensed Clinical Social Worker

## 2022-12-14 ENCOUNTER — Ambulatory Visit (HOSPITAL_COMMUNITY): Payer: 59 | Admitting: Licensed Clinical Social Worker

## 2022-12-14 LAB — SURGICAL PATHOLOGY

## 2022-12-14 NOTE — Telephone Encounter (Signed)
Tracey Lester this therapist a voicemail about her emergency appendectomy last Thursday and the fact that she will not be able to drive until [id number] so does not know if she can return to group until after this time and asking if there is some sort of protocol related to this.   The therapist returns Tracey Lester's call leaving a HIPAA-compliant voicemail informing her that there is no protocol other for her to rest and recuperate and call this therapist when she is ready to return.  Adam Phenix, [place], LCSW, [facility], [place] 12/14/2022

## 2022-12-14 NOTE — Telephone Encounter (Signed)
Received message from Florien at our [place] office-stating that patient would like to call back to cancel her colonoscopy. Returned her phone call.  LVM for pt to return my call.   Thanks, [place], [place]

## 2022-12-14 NOTE — Transitions of Care (Post Inpatient/ED Visit) (Unsigned)
   12/14/2022  Name: Tracey Lester MRN: [id number] DOB: September 05, 1971  Today's TOC FU Call Status: Today's TOC FU Call Status:: Unsuccessul Call (1st Attempt) Unsuccessful Call (1st Attempt) Date: 12/14/22  Attempted to reach the patient regarding the most recent Inpatient/ED visit.  Follow Up Plan: Additional outreach attempts will be made to reach the patient to complete the Transitions of Care (Post Inpatient/ED visit) call.   Signature Juanda Crumble, [place] Direct Dial [phone]

## 2022-12-15 NOTE — Transitions of Care (Post Inpatient/ED Visit) (Signed)
   12/15/2022  Name: Tracey Lester MRN: [id number] DOB: 1971/10/17  Today's TOC FU Call Status: Today's TOC FU Call Status:: Successful TOC FU Call Competed Unsuccessful Call (1st Attempt) Date: 12/14/22 [facility] FU Call Complete Date: 12/15/22  Transition Care Management Follow-up Telephone Call Date of Discharge: 12/11/22 Discharge Facility: [facility]) Type of Discharge: Inpatient Admission Primary Inpatient Discharge Diagnosis:: appendicitis How have you been since you were released from the hospital?: Better Any questions or concerns?: No  Items Reviewed: Did you receive and understand the discharge instructions provided?: Yes Medications obtained and verified?: Yes (Medications Reviewed) Any new allergies since your discharge?: No Dietary orders reviewed?: NA Do you have support at home?: Yes People in Home: spouse  Home Care and Equipment/Supplies: [place] Ordered?: NA Any new equipment or medical supplies ordered?: NA  Functional Questionnaire: Do you need assistance with bathing/showering or dressing?: No Do you need assistance with meal preparation?: No Do you need assistance with eating?: No Do you have difficulty maintaining continence: No Do you need assistance with getting out of bed/getting out of a chair/moving?: No Do you have difficulty managing or taking your medications?: No  Folllow up appointments reviewed: PCP Follow-up appointment confirmed?: NA Specialist Hospital Follow-up appointment confirmed?: Yes Date of Specialist follow-up appointment?: 12/24/22 Follow-Up Specialty Provider:: Dr Willa Rough Do you need transportation to your follow-up appointment?: No Do you understand care options if your condition(s) worsen?: Yes-patient verbalized understanding    [place], [place] Nurse Health Advisor Direct Dial [phone]

## 2022-12-16 ENCOUNTER — Ambulatory Visit (HOSPITAL_COMMUNITY): Payer: Managed Care, Other (non HMO)

## 2022-12-18 ENCOUNTER — Ambulatory Visit (HOSPITAL_COMMUNITY): Payer: Managed Care, Other (non HMO)

## 2022-12-21 ENCOUNTER — Ambulatory Visit: Admit: 2022-12-21 | Payer: Managed Care, Other (non HMO) | Admitting: Gastroenterology

## 2022-12-21 SURGERY — COLONOSCOPY WITH PROPOFOL
Anesthesia: General

## 2022-12-22 ENCOUNTER — Ambulatory Visit (HOSPITAL_COMMUNITY): Payer: 59 | Admitting: Licensed Clinical Social Worker

## 2022-12-25 ENCOUNTER — Telehealth (HOSPITAL_COMMUNITY): Payer: Self-pay | Admitting: Licensed Clinical Social Worker

## 2022-12-25 ENCOUNTER — Ambulatory Visit: Payer: Self-pay | Admitting: *Deleted

## 2022-12-25 ENCOUNTER — Telehealth: Payer: Self-pay | Admitting: Nurse Practitioner

## 2022-12-25 DIAGNOSIS — H1031 Unspecified acute conjunctivitis, right eye: Secondary | ICD-10-CM

## 2022-12-25 MED ORDER — OFLOXACIN 0.3 % OP SOLN: 1.0000 [drp] | Freq: Four times a day (QID) | OPHTHALMIC | 0 refills | Status: AC

## 2022-12-25 NOTE — Progress Notes (Signed)
Virtual Visit Consent   Tracey Lester, you are scheduled for a virtual visit with a [facility] provider today. Just as with appointments in the office, your consent must be obtained to participate. Your consent will be active for this visit and any virtual visit you may have with one of our providers in the next 365 days. If you have a MyChart account, a copy of this consent can be sent to you electronically.  As this is a virtual visit, video technology does not allow for your provider to perform a traditional examination. This may limit your provider's ability to fully assess your condition. If your provider identifies any concerns that need to be evaluated in person or the need to arrange testing (such as labs, EKG, etc.), we will make arrangements to do so. Although advances in technology are sophisticated, we cannot ensure that it will always work on either your end or our end. If the connection with a video visit is poor, the visit may have to be switched to a telephone visit. With either a video or telephone visit, we are not always able to ensure that we have a secure connection.  By engaging in this virtual visit, you consent to the provision of healthcare and authorize for your insurance to be billed (if applicable) for the services provided during this visit. Depending on your insurance coverage, you may receive a charge related to this service.  I need to obtain your verbal consent now. Are you willing to proceed with your visit today? Tracey Lester has provided verbal consent on 12/25/2022 for a virtual visit (video or telephone). Apolonio Schneiders, FNP  Date: 12/25/2022 3:14 PM  Virtual Visit via Video Note   I, Apolonio Schneiders, connected with  Tracey Lester  ([id number][id number]) on 12/25/22 at  3:15 PM EST by a video-enabled telemedicine application and verified that I am speaking with the correct person using two identifiers.  Location: Patient: Virtual Visit Location Patient:  Home Provider: Virtual Visit Location Provider: Home Office   I discussed the limitations of evaluation and management by telemedicine and the availability of in person appointments. The patient expressed understanding and agreed to proceed.    History of Present Illness: Tracey Lester is a 52 y.o. who identifies as a female who was assigned female at birth, and is being seen today for irritation in her right eye. This started three days ago, and she has been trying to wait it out, this morning she had more drainage and crusting to right eye.   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 ([place]) 08/10/2022   History of alcohol abuse 08/10/2022   Insomnia due to medical condition 07/21/2022   Other psychoactive substance use, unsp with withdrawal, unsp ([place]) 07/21/2022   MDD (major depressive disorder), recurrent, severe, with psychosis ([place]) 09/17/2020   MDD (major depressive disorder), recurrent, in full remission ([place]) 04/22/2020   High risk medication use 04/22/2020   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   GAD (generalized anxiety disorder) 05/31/2019   MDD (major depressive disorder), recurrent episode, mild ([place]) 05/31/2019   Insomnia due to mental  disorder 05/31/2019   Family history of celiac disease 11/28/2018   Generalized abdominal pain 11/28/2018   Alternating constipation and diarrhea 11/28/2018    Allergies:  Allergies  Allergen Reactions   Codeine Itching   Medications:  Current Outpatient Medications:    amitriptyline (ELAVIL) 25 MG tablet, TAKE 1 TABLET(25 MG) BY MOUTH  AT BEDTIME (Patient not taking: Reported on 12/15/2022), Disp: 30 tablet, Rfl: 0   amitriptyline (ELAVIL) 50 MG tablet, Take 50 mg by mouth at bedtime., Disp: , Rfl:    buprenorphine-naloxone (SUBOXONE) 2-0.5 mg SUBL SL tablet, Place 1 tablet under the tongue daily., Disp: , Rfl:    ferrous sulfate 220 (44 Fe) MG/5ML solution, Take 220 mg by mouth every other day., Disp: , Rfl:    Iron, Ferrous Sulfate, 325 (65 Fe) MG TABS, Take 325 mg by mouth daily., Disp: 30 tablet, Rfl: 3   lamoTRIgine (LAMICTAL) 100 MG tablet, Take 1 tablet (100 mg total) by mouth daily., Disp: 90 tablet, Rfl: 1   QUEtiapine (SEROQUEL) 25 MG tablet, Take 1-2 tablets as directedat bedtime, Disp: 60 tablet, Rfl: 2   sertraline (ZOLOFT) 100 MG tablet, Take 1.5 tablets (150 mg total) by mouth daily. (Patient taking differently: Take 100 mg by mouth daily.), Disp: 135 tablet, Rfl: 0   valACYclovir (VALTREX) 500 MG tablet, Take 1 tablet (500 mg total) by mouth daily as needed., Disp: 30 tablet, Rfl: 0  Observations/Objective: Patient is well-developed, well-nourished in no acute distress.  Resting comfortably  at home.  Head is normocephalic, atraumatic.  No labored breathing.  Speech is clear and coherent with logical content.  Patient is alert and oriented at baseline.    Assessment and Plan: 1. Acute bacterial conjunctivitis of right eye  - ofloxacin (OCUFLOX) 0.3 % ophthalmic solution; Place 1 drop into the right eye 4 (four) times daily for 5 days.  Dispense: 5 mL; Refill: 0     Follow Up Instructions: I discussed the assessment and treatment plan with the patient. The patient was provided an opportunity to ask questions and all were answered. The patient agreed with the plan and demonstrated an understanding of the instructions.  A copy of instructions were sent to the patient via MyChart unless otherwise noted below.    The patient was advised to call back or seek an in-person evaluation if the symptoms worsen or if  the condition fails to improve as anticipated.  Time:  I spent 10 minutes with the patient via telehealth technology discussing the above problems/concerns.    Apolonio Schneiders, FNP

## 2022-12-25 NOTE — Telephone Encounter (Signed)
  Chief Complaint: Right eye is irritated and draining Symptoms: Irritation, draining yellow fluid from inner corner of eye, crusty, redness of eye.   She has had pink eye before and this feels the same. Frequency: For last 3 days Pertinent Negatives: Patient denies burning or itching.   Just irritated and uncomfortable.   Disposition: '[]'$ ED /'[]'$ Urgent Care (no appt availability in office) / '[]'$ Appointment(In office/virtual)/ '[x]'$  [facility]/ '[]'$ Home Care/ '[]'$ Refused Recommended Disposition /'[]'$ [place] [facility]/ '[]'$  Follow-up with PCP Additional Notes: No appts. Available today at [facility].   (3 providers out of office) and Dr. Rosana Berger is full. I offered pt a [place] Virtual Visit via [place].   She was agreeable to doing this.   She had to get off the phone to do it.   I let her know if she got stuck or needed help to call us back.  She thanked me for my help.

## 2022-12-25 NOTE — Telephone Encounter (Signed)
The therapist attempts to reach [name] to determine if she is returning to [facility] IOP leaving a HIPAA-compliant voicemail.  Adam Phenix, [place], LCSW, [facility], [place] 12/25/2022

## 2022-12-25 NOTE — Telephone Encounter (Signed)
Message from [employer] sent at 12/25/2022 12:22 PM EST  Summary: right eye discomfort / rx req   The patient has experienced right eye irritation and discomfort for roughly three days  The patient has noticed fluid leaking from their eye as well as a crusty substance  The patient shares that their eye is red and discomfort, the patient shares that they are able to still see with their right eye  The patient would like to be prescribed something for their discomfort  Please contact the patient further when possible          Call History   Type Contact Phone/Fax User  12/25/2022 12:22 PM EST Phone (Incoming) Windy, Cos (Self) [phone] Lemmie Evens) Coley, [place] A   Reason for Disposition  [1] Eye with yellow or green discharge, or eyelashes stick together AND [2] NO PCP standing order to call in antibiotic eye drops   (Exception: [place]; continue triage.)  Answer Assessment - Initial Assessment Questions 1. EYE DISCHARGE: "Is the discharge in one or both eyes?" "What color is it?" "How much is there?" "When did the discharge start?"      I've had pink eye before.   All 3 of my kids are sick but not with pink eye. Is there a way to get relief drops?   2. REDNESS OF SCLERA: "Is the redness in one or both eyes?" "When did the redness start?"      The eyeball is red.   When I close my eye it feels like a bruise, sore.    Yellow fluid draining from inside my eye.    Right eye. 3. EYELIDS: "Are the eyelids red or swollen?" If Yes, ask: "How much?"      No 4. VISION: "Is there any difficulty seeing clearly?"      No 5. PAIN: "Is there any pain? If Yes, ask: "How bad is it?" (Scale 1-10; or mild, moderate, severe)    - MILD (1-3): doesn't interfere with normal activities     - MODERATE (4-7): interferes with normal activities or awakens from sleep    - SEVERE (8-10): excruciating pain, unable to do any normal activities       Irritation no burn 6. CONTACT LENS: "Do you wear  contacts?"     Yes but I  lhave them out 7. OTHER SYMPTOMS: "Do you have any other symptoms?" (e.g., fever, runny nose, cough)     No other symptoms 8. PREGNANCY: "Is there any chance you are pregnant?" "When was your last menstrual period?"     Not asked  Protocols used: Eye - Pus or [place]

## 2022-12-25 NOTE — Telephone Encounter (Signed)
The therapist receives a call from [place] confirming her identity via two identifiers. She says that she wishes to return to group on Monday, 12/28/22.  Adam Phenix, [place], LCSW, [facility], [place] 12/25/2022

## 2022-12-28 ENCOUNTER — Ambulatory Visit (INDEPENDENT_AMBULATORY_CARE_PROVIDER_SITE_OTHER): Payer: 59 | Admitting: Licensed Clinical Social Worker

## 2022-12-28 DIAGNOSIS — F1920 Other psychoactive substance dependence, uncomplicated: Secondary | ICD-10-CM

## 2022-12-28 DIAGNOSIS — F1021 Alcohol dependence, in remission: Secondary | ICD-10-CM

## 2022-12-28 DIAGNOSIS — F4312 Post-traumatic stress disorder, chronic: Secondary | ICD-10-CM

## 2022-12-28 NOTE — Progress Notes (Signed)
Daily Group Progress Note   Program: [facility] IOP     Individual Time: 9 a.m. to 12 p.m.    Type of Therapy: Process and Psychoeducational    Topic: The therapist checks in with group members, assesses for SI/HI/psychosis and overall level of functioning. The therapist inquires about sobriety date and number of community support meetings attended since last session.    The therapist talks about how one goes about choosing a Sponsor and that one has to find the right fit so may have to change Sponsors which is okay. He educates group members on the long-term impact that alcohol has on a person's sleep pattern and how long it will take for this to return to normal. He emphasizes that no matter what organization a person joins that as long as it has people that none will be perfect and there will always be challenging people. He shares research about recovery taking place in Fellowship but notes that this Fellowship does not just have to be AA or NA but there are other options for developing a non-using support system.    The therapist introduces two new group members today while saying goodbye to two who are graduating the program.    Summary: Linette presents rating her depression as a "0" and anxiety as an "0."    Lailaa says that she has been attending virtual meetings attending some AA and some NA meetings. She is checking in with her Sponsor daily and is working Step Four noting that she is using a Marketing executive Work Book which is very helpful. She says that her mood is "peaceful" and "proud." Shayne concludes that she is ready to graduate the program sharing her story of how she was when she came to group and how much better she is now.   She wants to step down to her previous outpatient providers with the plan to see this therapist if needed until she can get an appointment with her regular therapist. The therapist notes that the only area that Joani likely needs to address is the issue with him not  believing she is an addict or an alcoholic. She says that he does not want to believe that she is his mother or like her mother. The therapist discloses his belief that Aleli is like her husband's mother and her own in that she has the disease of addiction; however, different than both as she is in recovery so has changed the story.   Progress Towards Goals: [place] reports no drug or alcohol use   UDS collected: Yes Results: Yes, negative for drugs and alcohol   AA/NA attended?: Yes   Sponsor?: Yes   Adam Phenix, [place], [place], [facility], [place] 12/28/2022
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
## 2023-01-04 ENCOUNTER — Ambulatory Visit (INDEPENDENT_AMBULATORY_CARE_PROVIDER_SITE_OTHER): Payer: 59 | Admitting: Licensed Clinical Social Worker

## 2023-01-04 DIAGNOSIS — F330 Major depressive disorder, recurrent, mild: Secondary | ICD-10-CM

## 2023-01-04 DIAGNOSIS — F1920 Other psychoactive substance dependence, uncomplicated: Secondary | ICD-10-CM | POA: Diagnosis not present

## 2023-01-04 DIAGNOSIS — F411 Generalized anxiety disorder: Secondary | ICD-10-CM

## 2023-01-04 NOTE — Progress Notes (Signed)
Virtual Visit via Video Note  I connected with Tracey Lester on 01/04/23 at 10:00 AM EDT by a video enabled telemedicine application and verified that I am speaking with the correct person using two identifiers.   Location: Patient: home Provider: remote office [place], [place])   I discussed the limitations of evaluation and management by telemedicine and the availability of in person appointments. The patient expressed understanding and agreed to proceed.   I discussed the assessment and treatment plan with the patient. The patient was provided an opportunity to ask questions and all were answered. The patient agreed with the plan and demonstrated an understanding of the instructions.   The patient was advised to call back or seek an in-person evaluation if the symptoms worsen or if the condition fails to improve as anticipated.  I provided 45  minutes of non-face-to-face time during this encounter.   Tyge Somers R Darvin Dials, LCSW  THERAPIST PROGRESS NOTE  Session Time: 1015-11a  Participation Level: Active  Behavioral Response: CasualAlertAnxious, Depressed, and motivated  Type of Therapy: Individual Therapy  Treatment Goals addressed:   Decrease depressive symptoms and improve levels of effective functioning  as evidenced by pt self report 3 out of 5 sessions documented.   Reduce overall frequency, intensity, and duration of the anxiety so that daily functioning is not impaired  as evidenced by pt self report 3 out of 5 sessions documented  Accept the fact of chemical dependence and begin to actively participate and utilize behavioral strategies and cognitive coping skills to help maintain harm reduction behaviors  as evidenced by pt self report 3 out of 5 sessions documented.  ProgressTowards Goals: Progressing  Interventions: CBT, DBT, and Motivational Interviewing  Summary: Tracey Lester is a 52 y.o. female who presents with improving symptoms related to mood and anxiety.  Pt reports that overall mood is stable and that she is managing situational stressors well.   Allowed pt to explore and express thoughts and feelings associated with recent life situations and external stressors.  Patient is reporting improvements in overall mood, patient is reporting good quality and quantity of sleep.  Patient feels that after attending recent IOP groups, she has progressed significantly in her overall self-awareness, identifying true substance use, and development of behaviors to manage sobriety.  Patient reports that she has been able to understand that she has had a need to control everything in her environment, including other people.  Patient states that she feels the freedom in relinquishing this need to control.  Patient reports that she realizes that chaos had become given in her life, and even though she is seeking peace, it was truly hard for her to find peace.  Patient feels that it is much easier now for her to find that peaceful space that she has been seeking.  Praised patient's progress and overall self-awareness, identifying triggers, and her application of interventions to support her ongoing sobriety.  Patient reports that she still thinks about kratom every single day, but is aware that it was an addiction.  Patient states that she has developed a connection with others that has been missing from her life for a long time (group members).  Patient states that she is currently attending AA groups, and is finding them very helpful.  Patient has a sponsor and is currently working through the step process.  Patient reports that recently she developed an acute appendicitis attack, and needed emergency surgery.  Patient reports that this was a fairly traumatizing situation for her.  Allowed  patient to explore her thoughts, feelings, and self exploration that happened throughout this time of the rest and recovery.  Patient reports that she feels her body needed the rest and needed  the recovery.  Patient stated " it seemed that I just slept and slept for days".  Patient reports that she is able to set limits and set boundaries with others, in many different areas.  Patient states that this makes her feel more in control of her life.  Patient states that she is reading a book about boundaries, and is very excited about the contents.    Patient reports that ever since she has gone through the groups and gone through the recovery process from her surgery, patient feels some soreness in her abdomen at the surgical site, but feels that her overall body pain that she has been carrying for a long time has lifted from her body.  Patient reports her husband and family had been good support system for her.  Patient feels that her husband especially stepped in when she needed the surgery, and did all of the driving for the children, cooking, cleaning, etc.  Continued recommendations are as follows: self care behaviors, positive social engagements, focusing on overall work/home/life balance, and focusing on positive physical and emotional wellness.    Suicidal/Homicidal: No   Therapist Response: Pt is continuing to apply interventions learned in session into daily life situations. Pt is currently seeking personal goals to manage symptoms of depression and anxiety/stress. Personal growth and progress noted as intermittent/fluctuating.Treatment to continue as indicated.    Patient continues to make good progress with self awareness, life and behavior management, , improvements in self confidence, and maintaining sobriety/reduction in self-destructive behavior.  Plan: Return again in 4 weeks.   Encounter Diagnoses  Name Primary?   MDD (major depressive disorder), recurrent episode, mild (HCC) Yes   GAD (generalized anxiety disorder)    Other psychoactive substance dependence, uncomplicated (HCC)      Collaboration of Care: Other Pt to continue services with current provider, Donell Sievert PA. Pt also referred to Myrna Blazer LCSW for CDIOP through [facility].  Patient/Guardian was advised Release of Information must be obtained prior to any record release in order to collaborate their care with an outside provider. Patient/Guardian was advised if they have not already done so to contact the registration department to sign all necessary forms in order for [place] to release information regarding their care.   Consent: Patient/Guardian gives verbal consent for treatment and assignment of benefits for services provided during this visit. Patient/Guardian expressed understanding and agreed to proceed.      Ernest Haber Eulis Salazar, LCSW 01/04/2023

## 2023-01-06 ENCOUNTER — Other Ambulatory Visit: Payer: Self-pay | Admitting: Psychiatry

## 2023-01-06 DIAGNOSIS — F3342 Major depressive disorder, recurrent, in full remission: Secondary | ICD-10-CM

## 2023-01-08 ENCOUNTER — Encounter (HOSPITAL_COMMUNITY): Payer: Self-pay | Admitting: Licensed Clinical Social Worker

## 2023-01-08 NOTE — Addendum Note (Signed)
Addended by: Dara Hoyer on: 01/08/2023 12:10 PM   Modules accepted: Level of Service

## 2023-01-20 ENCOUNTER — Telehealth (HOSPITAL_COMMUNITY): Payer: Self-pay | Admitting: Medical

## 2023-01-21 ENCOUNTER — Ambulatory Visit (INDEPENDENT_AMBULATORY_CARE_PROVIDER_SITE_OTHER): Payer: 59 | Admitting: Licensed Clinical Social Worker

## 2023-01-21 DIAGNOSIS — F411 Generalized anxiety disorder: Secondary | ICD-10-CM

## 2023-01-21 DIAGNOSIS — F330 Major depressive disorder, recurrent, mild: Secondary | ICD-10-CM

## 2023-01-21 NOTE — Progress Notes (Signed)
Virtual Visit via Video Note  I connected with [name] on 01/21/23 at  1:00 PM EDT by a video enabled telemedicine application and verified that I am speaking with the correct person using two identifiers.   Location: Patient: home Provider: remote office [place], [place])   I discussed the limitations of evaluation and management by telemedicine and the availability of in person appointments. The patient expressed understanding and agreed to proceed.   I discussed the assessment and treatment plan with the patient. The patient was provided an opportunity to ask questions and all were answered. The patient agreed with the plan and demonstrated an understanding of the instructions.   The patient was advised to call back or seek an in-person evaluation if the symptoms worsen or if the condition fails to improve as anticipated.  I provided 60 minutes of non-face-to-face time during this encounter.   Tykeria Wawrzyniak R Hallie Ishida, LCSW  THERAPIST PROGRESS NOTE  Session Time: 1-2p  Participation Level: Active  Behavioral Response: CasualAlertAnxious, Depressed, and motivated  Type of Therapy: Individual Therapy  Treatment Goals addressed:   Decrease depressive symptoms and improve levels of effective functioning  as evidenced by pt self report 3 out of 5 sessions documented.   Reduce overall frequency, intensity, and duration of the anxiety so that daily functioning is not impaired  as evidenced by pt self report 3 out of 5 sessions documented  Accept the fact of chemical dependence and begin to actively participate and utilize behavioral strategies and cognitive coping skills to help maintain harm reduction behaviors  as evidenced by pt self report 3 out of 5 sessions documented.  ProgressTowards Goals: Progressing  Interventions: CBT, DBT, and Motivational Interviewing  Summary: Tracey Lester is a 52 y.o. female who presents with improving symptoms related to mood and anxiety. Pt  reports that overall mood is stable and that she is managing situational stressors well.  Patient reports that she is continuing to maintain current levels of sobriety.  Allowed pt to explore and express thoughts and feelings associated with recent life situations and external stressors.  Patient identifies an upcoming trip as one of her biggest current stressors.  Patient reports that she is going on a yoga and hiking retreat in [place].  Patient reports that when she planned the trip she initially felt some guilt associated with the expense-patient recognizes now that it is overall self-care and personal growth.  Patient states that in her mind she was thinking about this trip and hoping that she would be sober by this trip, and patient is currently recognizing that she has met one of her milestones (sobriety before trip).   Explored patient's thoughts and feelings associated with previous surgery/recovery/downtime.  Patient still feels that this era of her life was a very healing era.  Patient feels that her coping skills are very real coping skills, and not " fake coping skills".   Patient reports positive relationships with her husband and her children.  Patient reports that she feels that her and her husband are in a very positive state their relationship.  Patient feels reciprocity, and enjoys that they are helping one another.  Patient states that her mother was here for a visit, and made the comment to her about how "in awe" she was with the way that patient's children get along in such a positive way.  Allow patient to recognize that a lot of her positivity is a mentoring influence on her children, and that they are modeling her behavior.  Discussed internal locus of control versus external locus of control, and how patient frequently will look to external things to help her regulate herself internally.  Allow patient to explore coping skills that she could use for managing distress.  Reviewed  concept of distress tolerance.   Continued recommendations are as follows: self care behaviors, positive social engagements, focusing on overall work/home/life balance, and focusing on positive physical and emotional wellness.    Suicidal/Homicidal: No   Therapist Response: Pt is continuing to apply interventions learned in session into daily life situations. Pt is currently seeking personal goals to manage symptoms of depression and anxiety/stress. Personal growth and progress noted as intermittent/fluctuating.Treatment to continue as indicated.    Patient continues to make good progress with self awareness, life and behavior management, , improvements in self confidence, and maintaining sobriety/reduction in self-destructive behavior.  Plan: Return again in 4 weeks.   Encounter Diagnoses  Name Primary?   MDD (major depressive disorder), recurrent episode, mild (HCC) Yes   GAD (generalized anxiety disorder)     Collaboration of Care: Other Pt to continue services with current provider, Patriciaann Clan PA. Pt also referred to [place] for CDIOP through [facility].  Patient/Guardian was advised Release of Information must be obtained prior to any record release in order to collaborate their care with an outside provider. Patient/Guardian was advised if they have not already done so to contact the registration department to sign all necessary forms in order for [place] to release information regarding their care.   Consent: Patient/Guardian gives verbal consent for treatment and assignment of benefits for services provided during this visit. Patient/Guardian expressed understanding and agreed to proceed.      [place], LCSW 01/21/2023

## 2023-02-04 ENCOUNTER — Ambulatory Visit (INDEPENDENT_AMBULATORY_CARE_PROVIDER_SITE_OTHER): Payer: 59 | Admitting: Licensed Clinical Social Worker

## 2023-02-04 DIAGNOSIS — F411 Generalized anxiety disorder: Secondary | ICD-10-CM | POA: Diagnosis not present

## 2023-02-04 DIAGNOSIS — F330 Major depressive disorder, recurrent, mild: Secondary | ICD-10-CM

## 2023-02-04 NOTE — Progress Notes (Signed)
Virtual Visit via Video Note  I connected with Tracey Lester on 02/04/23 at  1:00 PM EDT by a video enabled telemedicine application and verified that I am speaking with the correct person using two identifiers.   Location: Patient: home Provider:[facility]   I discussed the limitations of evaluation and management by telemedicine and the availability of in person appointments. The patient expressed understanding and agreed to proceed.   I discussed the assessment and treatment plan with the patient. The patient was provided an opportunity to ask questions and all were answered. The patient agreed with the plan and demonstrated an understanding of the instructions.   The patient was advised to call back or seek an in-person evaluation if the symptoms worsen or if the condition fails to improve as anticipated.  I provided 60 minutes of non-face-to-face time during this encounter.   Consuello Lassalle R Caidyn Henricksen, LCSW  THERAPIST PROGRESS NOTE  Session Time: 1-2p  Participation Level: Active  Behavioral Response: CasualAlertAnxious, Depressed, and motivated  Type of Therapy: Individual Therapy  Treatment Goals addressed:   Decrease depressive symptoms and improve levels of effective functioning  as evidenced by pt self report 3 out of 5 sessions documented.   Reduce overall frequency, intensity, and duration of the anxiety so that daily functioning is not impaired  as evidenced by pt self report 3 out of 5 sessions documented  Accept the fact of chemical dependence and begin to actively participate and utilize behavioral strategies and cognitive coping skills to help maintain harm reduction behaviors  as evidenced by pt self report 3 out of 5 sessions documented.  ProgressTowards Goals: Progressing  Interventions: CBT, DBT, and Motivational Interviewing  Summary: Tracey Lester is a 52 y.o. female who presents with improving symptoms related to mood  and anxiety. Pt reports that overall mood is stable and that she is managing situational stressors well.  Patient reports that she is continuing to maintain current levels of sobriety.  Patient reports that she made the decision herself to increase her dose of Lamictal-patient reports that she will discuss this with her medication provider at her next appointment. .  Clinician assisted pt with identifying situations/scenarios/schemas triggering anxiety and/or depression symptoms. Allowed pt to explore and express thoughts and feelings and discussed current coping mechanisms. Reviewed changes/recommendations.  Patient explored her recent trip to [place].  Allowed patient to care instances of personal growth, and recognizing old triggers, old coping skills, and embracing new ways of managing self that she learned from this experience.  Patient reports that she feels that it was a fantastic reset, and a new beginning for herself.  Allowed patient to explore relationships-relationships and husband, relationships with children, and relationship with mother and other family members.  Patient reports that sometimes she feels " lost" when they are altogether.  Patient states they often all wander around or communicate in a passive aggressive kind of way.  Encouraged patient to be a leader amongst the group, and make decisions for the whole group, if no one else is willing to do that for everyone else (for deciding things like activities, food, etc.).  Discussed how that could be a factor to decrease the amount of chaos.  Allow patient to continue to identify dysfunctional communication patterns, and clinician and patient both explored improvements in communication styles.  Continued recommendations are as follows: self care behaviors, positive social engagements, focusing on overall work/home/life balance, and focusing on positive physical and emotional wellness.    Suicidal/Homicidal: No  Therapist Response: Pt  is continuing to apply interventions learned in session into daily life situations. Pt is currently seeking personal goals to manage symptoms of depression and anxiety/stress. Personal growth and progress noted as intermittent/fluctuating.Treatment to continue as indicated.    Patient continues to make good progress with self awareness, life and behavior management, , improvements in self confidence, and maintaining sobriety/reduction in self-destructive behavior.  Plan: Return again in 4 weeks.   Encounter Diagnoses  Name Primary?   MDD (major depressive disorder), recurrent episode, mild Yes   GAD (generalized anxiety disorder)    Collaboration of Care: Other Pt to continue services with current provider, Donell SievertSpencer Simon PA. Pt also referred to Myrna BlazerBill Garrot LCSW [facility].  Patient/Guardian was advised Release of Information must be obtained prior to any record release in order to collaborate their care with an outside provider. Patient/Guardian was advised if they have not already done so to contact the registration department to sign all necessary forms in order for us to release information regarding their care.   Consent: Patient/Guardian gives verbal consent for treatment and assignment of benefits for services provided during this visit. Patient/Guardian expressed understanding and agreed to proceed.      Ernest HaberChristina R Kimanh Templeman, LCSW 02/04/2023

## 2023-02-12 ENCOUNTER — Ambulatory Visit (INDEPENDENT_AMBULATORY_CARE_PROVIDER_SITE_OTHER): Payer: 59 | Admitting: Licensed Clinical Social Worker

## 2023-02-12 DIAGNOSIS — F330 Major depressive disorder, recurrent, mild: Secondary | ICD-10-CM | POA: Diagnosis not present

## 2023-02-12 DIAGNOSIS — F411 Generalized anxiety disorder: Secondary | ICD-10-CM | POA: Diagnosis not present

## 2023-02-12 NOTE — Progress Notes (Signed)
Virtual Visit via Video Note  I connected with Tracey Lester on 02/12/23 at  9:00 AM EDT by a video enabled telemedicine application and verified that I am speaking with the correct person using two identifiers.   Location: Patient: home Provider:remote office [place], [place])   I discussed the limitations of evaluation and management by telemedicine and the availability of in person appointments. The patient expressed understanding and agreed to proceed.   I discussed the assessment and treatment plan with the patient. The patient was provided an opportunity to ask questions and all were answered. The patient agreed with the plan and demonstrated an understanding of the instructions.   The patient was advised to call back or seek an in-person evaluation if the symptoms worsen or if the condition fails to improve as anticipated.  I provided 60 minutes of non-face-to-face time during this encounter.   Gresham Caetano R Armiyah Capron, LCSW  THERAPIST PROGRESS NOTE  Session Time: 9-10a  Participation Level: Active  Behavioral Response: CasualAlertAnxious, Depressed, and motivated  Type of Therapy: Individual Therapy  Treatment Goals addressed:   Decrease depressive symptoms and improve levels of effective functioning  as evidenced by pt self report 3 out of 5 sessions documented.   Reduce overall frequency, intensity, and duration of the anxiety so that daily functioning is not impaired  as evidenced by pt self report 3 out of 5 sessions documented   ProgressTowards Goals: Progressing  Interventions: CBT, DBT, and Motivational Interviewing  Summary: Tracey Lester is a 52 y.o. female who presents with improving symptoms related to mood and anxiety. Pt reports that overall mood is stable and that she is managing situational stressors well.    Clinician assisted pt with identifying situations/scenarios/schemas triggering anxiety and/or depression symptoms. Allowed pt to explore and express  thoughts and feelings and discussed current coping mechanisms. Reviewed changes/recommendations.  Patient reports that she is continuing to incorporate journaling into her routine regularly.  Discussed daily structure, and allowing time for management of work related activities (emails, text messages) to allow patient to feel more productive during the day.  Discussed having a segment of time in the morning and a segment of time in the afternoon to allow patient to focus and limit distractions during administrative work time.   Used guided imagery/and elements of EMDR (bilateral tapping) for intstilling positive cognitions.  Patient reports that when she was on her trip, she had several incidents where she felt very very small and recognize that the world was a very big place.  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  Encouraged patient to continue noticing somatic cues, and using her grounding, mindfulness, and meditation as needed.   Continued recommendations are as follows: self care behaviors, positive social engagements, focusing on overall work/home/life balance, and focusing on positive physical and emotional wellness.    Suicidal/Homicidal: No   Therapist Response: Pt is continuing to apply interventions learned in session into daily life situations. Pt is currently seeking personal goals to manage symptoms of depression and anxiety/stress. Personal growth and progress noted as intermittent/fluctuating.Treatment to continue as indicated.    Patient continues to make good progress with self awareness, life and behavior management, , improvements in self confidence, and maintaining sobriety/reduction in self-destructive behavior.  Plan: Return again in 4 weeks.   Encounter Diagnoses  Name Primary?   MDD (major depressive disorder), recurrent episode, mild Yes   GAD (generalized anxiety disorder)    Collaboration of Care: Other Pt to continue services with current  provider, Donell Sievert PA. Pt also referred to Myrna Blazer LCSW for CDIOP through [facility].  Patient/Guardian was advised Release of Information must be obtained prior to any record release in order to collaborate their care with an outside provider. Patient/Guardian was advised if they have not already done so to contact the registration department to sign all necessary forms in order for [place] to release information regarding their care.   Consent: Patient/Guardian gives verbal consent for treatment and assignment of benefits for services provided during this visit. Patient/Guardian expressed understanding and agreed to proceed.      Ernest Haber Talayia Hjort, LCSW 02/12/2023

## 2023-02-16 ENCOUNTER — Encounter: Payer: Self-pay | Admitting: Medical

## 2023-03-04 ENCOUNTER — Ambulatory Visit (INDEPENDENT_AMBULATORY_CARE_PROVIDER_SITE_OTHER): Payer: 59 | Admitting: Licensed Clinical Social Worker

## 2023-03-04 DIAGNOSIS — F411 Generalized anxiety disorder: Secondary | ICD-10-CM | POA: Diagnosis not present

## 2023-03-04 DIAGNOSIS — F330 Major depressive disorder, recurrent, mild: Secondary | ICD-10-CM | POA: Diagnosis not present

## 2023-03-04 NOTE — Progress Notes (Signed)
Virtual Visit via Video Note  I connected with Tracey Lester on 03/04/23 at 11:00 AM EDT by a video enabled telemedicine application and verified that I am speaking with the correct person using two identifiers.   Location: Patient: home Provider:remote office [place], [place])   I discussed the limitations of evaluation and management by telemedicine and the availability of in person appointments. The patient expressed understanding and agreed to proceed.   I discussed the assessment and treatment plan with the patient. The patient was provided an opportunity to ask questions and all were answered. The patient agreed with the plan and demonstrated an understanding of the instructions.   The patient was advised to call back or seek an in-person evaluation if the symptoms worsen or if the condition fails to improve as anticipated.  I provided 60 minutes of non-face-to-face time during this encounter.   Ursula Dermody R Tonna Palazzi, LCSW  THERAPIST PROGRESS NOTE  Session Time: 11a-12p  Participation Level: Active  Behavioral Response: CasualAlertAnxious, Depressed, and motivated  Type of Therapy: Individual Therapy  Treatment Goals addressed:   Decrease depressive symptoms and improve levels of effective functioning  as evidenced by pt self report 3 out of 5 sessions documented.   Reduce overall frequency, intensity, and duration of the anxiety so that daily functioning is not impaired  as evidenced by pt self report 3 out of 5 sessions documented   ProgressTowards Goals: Progressing  Interventions: CBT, DBT, and Motivational Interviewing  Summary: Tracey Lester is a 52 y.o. female who presents with improving symptoms related to mood and anxiety. Pt reports that overall mood is stable and that she is managing situational stressors well.    Assisted pt with identifying external stressors and ways that pt has been coping with the stress. Pt states she's been helping friends with big  projects more--pt feels good about this.  Reviewed stress management strategies. Pt reflects understanding and willingness to cooperate and make positive changes.   Assisted pt with identifying stress/anxiety triggered by childhood trauma. Explored memories from the past and assisted pt with identifying emotions associated with the memories. Discussed relativity of thoughts, behaviors, and emotions. Allowed pt to explore how traumas from the past impact current behavior patterns. Allowed pt to explore specific fears (giving urine sample at psychiatrist office) or generalized anxiety and reviewed coping skills for anxiety management.  Used positive imagery to assist pt in development of a safe space--hiking in [employer].    Allowed pt to explore current intrusive thoughts (childhood--mother and father watching bodily functions closely). Encouraged pt to focus on mindfulness/meditation strategies to ground self to present. Assisted pt with identifying triggers, cognitive restructuring, and challenging thoughts. Discussed externalizing intrusive thoughts.    Continued recommendations are as follows: self care behaviors, positive social engagements, focusing on overall work/home/life balance, and focusing on positive physical and emotional wellness.    Suicidal/Homicidal: No   Therapist Response: Pt is continuing to apply interventions learned in session into daily life situations. Pt is currently seeking personal goals to manage symptoms of depression and anxiety/stress. Personal growth and progress noted as intermittent/fluctuating.Treatment to continue as indicated.    Patient continues to make good progress with self awareness, life and behavior management, , improvements in self confidence, and maintaining sobriety/reduction in self-destructive behavior.  Plan: Return again in 4 weeks.   Encounter Diagnoses  Name Primary?   MDD (major depressive disorder), recurrent episode, mild (HCC) Yes   GAD  (generalized anxiety disorder)     Collaboration of Care: Other  Pt to continue services with current provider, Donell Sievert PA. Pt also referred to Myrna Blazer LCSW for CDIOP through [facility].  Patient/Guardian was advised Release of Information must be obtained prior to any record release in order to collaborate their care with an outside provider. Patient/Guardian was advised if they have not already done so to contact the registration department to sign all necessary forms in order for [place] to release information regarding their care.   Consent: Patient/Guardian gives verbal consent for treatment and assignment of benefits for services provided during this visit. Patient/Guardian expressed understanding and agreed to proceed.     Ernest Haber Ashlinn Hemrick, LCSW 03/04/2023

## 2023-03-25 ENCOUNTER — Ambulatory Visit (INDEPENDENT_AMBULATORY_CARE_PROVIDER_SITE_OTHER): Payer: 59 | Admitting: Licensed Clinical Social Worker

## 2023-03-25 DIAGNOSIS — F4312 Post-traumatic stress disorder, chronic: Secondary | ICD-10-CM | POA: Diagnosis not present

## 2023-03-25 DIAGNOSIS — F330 Major depressive disorder, recurrent, mild: Secondary | ICD-10-CM | POA: Diagnosis not present

## 2023-03-25 DIAGNOSIS — F411 Generalized anxiety disorder: Secondary | ICD-10-CM | POA: Diagnosis not present

## 2023-03-25 DIAGNOSIS — F1920 Other psychoactive substance dependence, uncomplicated: Secondary | ICD-10-CM | POA: Diagnosis not present

## 2023-03-25 NOTE — Progress Notes (Addendum)
Virtual Visit via Video Note  I connected with Tracey Lester on 03/25/23 at 10:00 AM EDT by a video enabled telemedicine application and verified that I am speaking with the correct person using two identifiers.   Location: Patient: home Provider:remote office [place], [place])   I discussed the limitations of evaluation and management by telemedicine and the availability of in person appointments. The patient expressed understanding and agreed to proceed.   I discussed the assessment and treatment plan with the patient. The patient was provided an opportunity to ask questions and all were answered. The patient agreed with the plan and demonstrated an understanding of the instructions.   The patient was advised to call back or seek an in-person evaluation if the symptoms worsen or if the condition fails to improve as anticipated.  I provided 45 minutes of non-face-to-face time during this encounter.   [name], LCSW  THERAPIST PROGRESS NOTE  Session Time: 1015-11a  Participation Level: Active  Behavioral Response: CasualAlertAnxious, Depressed, and motivated  Type of Therapy: Individual Therapy  Treatment Goals addressed:   Decrease depressive symptoms and improve levels of effective functioning  as evidenced by pt self report 3 out of 5 sessions documented.   Reduce overall frequency, intensity, and duration of the anxiety so that daily functioning is not impaired  as evidenced by pt self report 3 out of 5 sessions documented   ProgressTowards Goals: Progressing--pt working through trauma  Interventions: CBT, DBT, and Motivational Interviewing  Summary: Tracey Lester is a 52 y.o. female who presents with improving symptoms related to mood and anxiety. Pt reports that overall mood is stable and that she is managing situational stressors well.  Pt reports she is compliant with her medication with no side effects.   Allowed pt to explore current substance use. Pt  reports that they are currently experiencing cravings for kratom multiple times per day every day. Used motivational interviewing to assist pt with pushing through cravings. Discussed incorporating alternative behaviors to substance use and allowed pt to identify which behaviors she would be able to try. Pt admits that she doesn't find AA groups helpful and that she doesn't like the sponsors that she has tried in the past because they were still using substances (THC). Pt reports that she is not comfortable with that.   Assisted pt with identifying stress/anxiety triggered by trauma. Explored memories from the past and assisted pt with identifying emotions associated with the memories. Discussed relativity of thoughts, behaviors, and emotions--even tied in relevance to substance use.  Discussed pts awareness of how trauma has impacted overall life. Clinician provided empathy and support while giving patient safe space to explore traumatic incidents. Encouraged pt to engage in positive social activities, physical activities as able, and try to get as much rest as possible. Reviewed pts current coping strategies and allowed pt to identify efficacy and make changes if necessary.Discussed community support groups.  Pt reports that she has pushed through her avoidance response re: providing urine for drug screens--pt discussed w/ psychiatric provider and accommodations were arranged and pt now feels more comfortable.   Clinician and pt explored patient expectations and reality. Discussed similarities and differences--and discussed psychological impact. Allowed pt to identify what elements that they have the power to change. Pt made statement "I just don't like my life sometimes"  Pt reports that she wants to speak with psychiatric med provider about suboxone dosage--pt requesting increase.   Continued recommendations are as follows: self care behaviors, positive social engagements, focusing  on overall  work/home/life balance, and focusing on positive physical and emotional wellness.    Suicidal/Homicidal: No   Therapist Response: Pt is continuing to apply interventions learned in session into daily life situations. Pt is currently seeking personal goals to manage symptoms of depression and anxiety/stress. Personal growth and progress noted as intermittent/fluctuating.Treatment to continue as indicated.    Patient continues to make good progress with self awareness, life and behavior management, , improvements in self confidence, and maintaining sobriety/reduction in self-destructive behavior.  Plan: Return again in 4 weeks.   Encounter Diagnoses  Name Primary?   MDD (major depressive disorder), recurrent episode, mild (HCC) Yes   GAD (generalized anxiety disorder)    Other psychoactive substance dependence, uncomplicated (HCC)    Chronic post-traumatic stress disorder (PTSD)      Collaboration of Care: Other Pt to continue services with current provider, Donell Sievert PA.  Patient/Guardian was advised Release of Information must be obtained prior to any record release in order to collaborate their care with an outside provider. Patient/Guardian was advised if they have not already done so to contact the registration department to sign all necessary forms in order for [place] to release information regarding their care.   Consent: Patient/Guardian gives verbal consent for treatment and assignment of benefits for services provided during this visit. Patient/Guardian expressed understanding and agreed to proceed.     Ernest Haber [name], LCSW 03/25/2023

## 2023-03-30 NOTE — Progress Notes (Unsigned)
   Acute Office Visit  Subjective:     Patient ID: Tracey Lester, female    DOB: [birth year], 52 y.o.   MRN: [id number]  No chief complaint on file.   HPI Patient is in today for weight gain.  WEIGHT GAIN Duration:  Previous attempts at weight loss: {Blank single:19197::"yes","no"} Complications of obesity:  Peak weight:  Weight loss goal:  Weight loss to date:  Requesting obesity pharmacotherapy: {Blank single:19197::"yes","no"} Current weight loss supplements/medications: {Blank single:19197::"yes","no"} Previous weight loss supplements/meds: {Blank single:19197::"yes","no"} Calories:    ROS      Objective:    There were no vitals taken for this visit. {Vitals History (Optional):23777}  Physical Exam  No results found for any visits on 03/31/23.      Assessment & Plan:   Problem List Items Addressed This Visit   None   No orders of the defined types were placed in this encounter.   No follow-ups on file.  Margarita Mail, DO

## 2023-03-31 ENCOUNTER — Encounter: Payer: Self-pay | Admitting: Internal Medicine

## 2023-03-31 ENCOUNTER — Ambulatory Visit (INDEPENDENT_AMBULATORY_CARE_PROVIDER_SITE_OTHER): Payer: Managed Care, Other (non HMO) | Admitting: Internal Medicine

## 2023-03-31 VITALS — BP 124/66 | HR 98 | Temp 97.7°F | Resp 16 | Ht 70.0 in | Wt 170.7 lb

## 2023-03-31 DIAGNOSIS — E559 Vitamin D deficiency, unspecified: Secondary | ICD-10-CM

## 2023-03-31 DIAGNOSIS — F3342 Major depressive disorder, recurrent, in full remission: Secondary | ICD-10-CM

## 2023-03-31 DIAGNOSIS — R7989 Other specified abnormal findings of blood chemistry: Secondary | ICD-10-CM

## 2023-03-31 DIAGNOSIS — E039 Hypothyroidism, unspecified: Secondary | ICD-10-CM

## 2023-03-31 DIAGNOSIS — R635 Abnormal weight gain: Secondary | ICD-10-CM

## 2023-03-31 DIAGNOSIS — R5383 Other fatigue: Secondary | ICD-10-CM

## 2023-04-01 NOTE — Addendum Note (Signed)
Addended by: Margarita Mail on: 04/01/2023 08:01 AM   Modules accepted: Orders

## 2023-04-02 LAB — CBC WITH DIFFERENTIAL/PLATELET
Absolute Monocytes: 534 cells/uL (ref 200–950)
Basophils Absolute: 132 cells/uL (ref 0–200)
Basophils Relative: 2.7 %
Eosinophils Absolute: 333 cells/uL (ref 15–500)
Eosinophils Relative: 6.8 %
HCT: 38.6 % (ref 35.0–45.0)
Hemoglobin: 13.1 g/dL (ref 11.7–15.5)
Lymphs Abs: 1352 cells/uL (ref 850–3900)
MCH: 32 pg (ref 27.0–33.0)
MCHC: 33.9 g/dL (ref 32.0–36.0)
MCV: 94.4 fL (ref 80.0–100.0)
MPV: 9.5 fL (ref 7.5–12.5)
Monocytes Relative: 10.9 %
Neutro Abs: 2548 cells/uL (ref 1500–7800)
Neutrophils Relative %: 52 %
Platelets: 303 10*3/uL (ref 140–400)
RBC: 4.09 10*6/uL (ref 3.80–5.10)
RDW: 13 % (ref 11.0–15.0)
Total Lymphocyte: 27.6 %
WBC: 4.9 10*3/uL (ref 3.8–10.8)

## 2023-04-02 LAB — VITAMIN D 25 HYDROXY (VIT D DEFICIENCY, FRACTURES): Vit D, 25-Hydroxy: 37 ng/mL (ref 30–100)

## 2023-04-02 LAB — TEST AUTHORIZATION

## 2023-04-02 LAB — IRON,TIBC AND FERRITIN PANEL
%SAT: 22 % (calc) (ref 16–45)
Ferritin: 17 ng/mL (ref 16–232)
Iron: 67 ug/dL (ref 40–190)
TIBC: 304 mcg/dL (calc) (ref 250–450)

## 2023-04-02 LAB — THYROID PROFILE - CHCC
Free Thyroxine Index: 1.5 (ref 1.4–3.8)
T3 Uptake: 32 % (ref 22–35)
T4, Total: 4.7 ug/dL — ABNORMAL LOW (ref 5.1–11.9)

## 2023-04-02 LAB — TSH: TSH: 6.63 mIU/L — ABNORMAL HIGH

## 2023-04-02 MED ORDER — LEVOTHYROXINE SODIUM 25 MCG PO TABS: 25.0000 ug | ORAL_TABLET | Freq: Every day | ORAL | 1 refills | Status: DC

## 2023-04-02 NOTE — Addendum Note (Signed)
Addended by: Margarita Mail on: 04/02/2023 10:32 AM   Modules accepted: Orders

## 2023-04-15 ENCOUNTER — Ambulatory Visit (INDEPENDENT_AMBULATORY_CARE_PROVIDER_SITE_OTHER): Payer: 59 | Admitting: Licensed Clinical Social Worker

## 2023-04-15 DIAGNOSIS — Z91199 Patient's noncompliance with other medical treatment and regimen due to unspecified reason: Secondary | ICD-10-CM

## 2023-04-15 NOTE — Progress Notes (Signed)
LCSW counselor attempted to connect with patient for scheduled appointment via MyChart video text request x 2 and email request with no response.   Attempt 1: Text and email: 11:05a  Attempt 2: Text and email: 11:11a  Video session was closed at : 11:17a  Per [facility] policy, after multiple attempts to reach pt unsuccessfully at appointed time--visit will be coded as no show

## 2023-05-05 ENCOUNTER — Ambulatory Visit (INDEPENDENT_AMBULATORY_CARE_PROVIDER_SITE_OTHER): Payer: 59 | Admitting: Licensed Clinical Social Worker

## 2023-05-05 DIAGNOSIS — F411 Generalized anxiety disorder: Secondary | ICD-10-CM | POA: Diagnosis not present

## 2023-05-05 DIAGNOSIS — F330 Major depressive disorder, recurrent, mild: Secondary | ICD-10-CM

## 2023-05-05 NOTE — Progress Notes (Signed)
Virtual Visit via Video Note  I connected with Tracey Lester on 05/05/23 at 10:00 AM EDT by a video enabled telemedicine application and verified that I am speaking with the correct person using two identifiers.   Location: Patient: home Provider: [facility]   I discussed the limitations of evaluation and management by telemedicine and the availability of in person appointments. The patient expressed understanding and agreed to proceed.   I discussed the assessment and treatment plan with the patient. The patient was provided an opportunity to ask questions and all were answered. The patient agreed with the plan and demonstrated an understanding of the instructions.   The patient was advised to call back or seek an in-person evaluation if the symptoms worsen or if the condition fails to improve as anticipated.  I provided 60 minutes of non-face-to-face time during this encounter.   Gabrial Poppell R Laniece Hornbaker, LCSW  THERAPIST PROGRESS NOTE  Session Time: 10-11a  Participation Level: Active  Behavioral Response: CasualAlertAnxious, Depressed, and motivated, hopeful  Type of Therapy: Individual Therapy  Treatment Goals addressed:   Decrease depressive symptoms and improve levels of effective functioning  as evidenced by pt self report 3 out of 5 sessions documented.   Reduce overall frequency, intensity, and duration of the anxiety so that daily functioning is not impaired  as evidenced by pt self report 3 out of 5 sessions documented   ProgressTowards Goals: Progressing--pt working through trauma  Interventions: CBT, Motivational Interviewing, and Reframing  Summary: Tracey Lester is a 52 y.o. female who presents with improving symptoms related to mood and anxiety.   Assisted pt with identifying anxiety triggers. Discussed importance of pushing through the avoidance response and to use coping skills to manage any feelings of distress. Allowed pt  to explore and identify any limits or boundaries that could be helpful with managing stress/anxiety triggers. Pt reports working hard on setting boundaries and being intentional about regular self care behavior.   Pt reports ongoing progress with substance use--currently continuing with early remission.   Continued recommendations are as follows: self care behaviors, positive social engagements, focusing on overall work/home/life balance, and focusing on positive physical and emotional wellness.    Suicidal/Homicidal: No   Therapist Response: Pt is continuing to apply interventions learned in session into daily life situations. Pt is currently seeking personal goals to manage symptoms of depression and anxiety/stress. Personal growth and progress noted as intermittent/fluctuating.Treatment to continue as indicated.    Patient continues to make good progress with self awareness, life and behavior management, , improvements in self confidence, and maintaining sobriety/reduction in self-destructive behavior.  Plan: Return again in 1 week. Informed patient that clinician will be leaving outpatient department. Allowed pt to explore any questions or concerns and discussed future counseling options/resources. Provided pt with psychoeducational resources and list of OPT therapists. Encouraged pt to continue with psychiatric med management appointments, if applicable.    Encounter Diagnoses  Name Primary?   MDD (major depressive disorder), recurrent episode, mild (HCC) Yes   GAD (generalized anxiety disorder)     Collaboration of Care: Other Pt to continue services with current provider, Donell Sievert PA.  Patient/Guardian was advised Release of Information must be obtained prior to any record release in order to collaborate their care with an outside provider. Patient/Guardian was advised if they have not already done so to contact the registration department to sign all necessary forms in order for  [place] to release information regarding their care.   Consent: Patient/Guardian  gives verbal consent for treatment and assignment of benefits for services provided during this visit. Patient/Guardian expressed understanding and agreed to proceed.     Ernest Haber Jacquette Canales, LCSW 05/05/2023

## 2023-05-05 NOTE — Patient Instructions (Signed)
Outpatient Psychiatry and Counseling  FOR CRISIS:  call 911, [facility]: Mobile Crisis Management 24 hours:  [phone], call [phone], [facility] ([place] [facility]) [address] walk in, or go to your local EMERGENCY DEPARTMENT  [facility] [address], [place], [place] [address]  [phone]  [facility] [address] [place], [place] [address] [phone]  [facility] Address: [address], [place], [place] [address] Phone: [phone]  [facility] [place] and [place] Locations) [email or web address]  [employer] [facility] [employer] fee and walk in schedule: M-F 8am-12pm/1pm-3pm [address]  [place], [place] [address] [phone]  [facility] [address] [place], [place] [address] [phone]  [facility]/ Intensive Outpatient Therapy Program/CDIOP/PHP [address] [place], [place] [address] [phone]  [facility]gent [facility], Outpatient Therapy Services, [place]      [phone]     [address]    [place], [place] [address]                 [facility]   [facility] [phone]. [address] [place], [place] [address]  [facility]          [address]  [place], [place] [address]       [phone]  [facility] [address] [place], [place] [address] [phone]  [facility]    [address]    [place], [place] [address]     [phone]       [facility] [address] [place] [address]  [facility]     [address] [place], [place]      [phone]       [facility] Eulogio Ditch, MD [address]  [address] [place], [place] [address] [phone]  [facility]     [address]     [place], [place] [address]     [phone]       [facility] [address] [place], [place] [address] [phone] Resources for Temporary Residential Assistance/Crisis Centers

## 2023-05-14 ENCOUNTER — Ambulatory Visit (HOSPITAL_COMMUNITY): Payer: 59 | Admitting: Licensed Clinical Social Worker

## 2023-05-14 NOTE — Progress Notes (Deleted)
Clinician was in video chat room for appointment scheduled at 11:00 am.  Clinician sent two emails and two text message requests for connection.  Attempts to connect were unsuccesssful.   Patient possibly does not have access to internet due to global outage at time of session.

## 2023-05-25 ENCOUNTER — Other Ambulatory Visit: Payer: Self-pay | Admitting: Internal Medicine

## 2023-05-25 DIAGNOSIS — E039 Hypothyroidism, unspecified: Secondary | ICD-10-CM

## 2023-05-26 NOTE — Telephone Encounter (Signed)
Requested Prescriptions  Pending Prescriptions Disp Refills   levothyroxine (SYNTHROID) 25 MCG tablet [Pharmacy Med Name: LEVOTHYROXINE 0.025MG  ( ) TAB] 90 tablet 1    Sig: TAKE 1 TABLET(25 MCG) BY MOUTH DAILY     Endocrinology:  Hypothyroid Agents Failed - 05/25/2023 12:39 PM      Failed - TSH in normal range and within 360 days    TSH  Date Value Ref Range Status  03/31/2023 6.63 (H) mIU/L Final    Comment:              Reference Range .           > or = 20 Years  0.40-4.50 .                Pregnancy Ranges           First trimester    0.26-2.66           Second trimester   0.55-2.73           Third trimester    0.43-2.91          Passed - Valid encounter within last 12 months    Recent Outpatient Visits           1 month ago Fatigue, unspecified type   [facility] Margarita Mail, DO   6 months ago Annual physical exam   [facility] Margarita Mail, DO   7 months ago GAD (generalized anxiety disorder)   [facility] Margarita Mail, DO       Future Appointments             In 1 month Margarita Mail, DO [facility]

## 2023-06-30 NOTE — Progress Notes (Deleted)
Established Patient Office Visit  Subjective    Patient ID: Tracey Lester, female    DOB: 04/19/1971  Age: 52 y.o. MRN: [id number]  CC:  No chief complaint on file.   HPI Tracey Lester presents to recheck thyroid labs.  Hypothyroidism: -Medications: Levothyroxine 25 mcg -Patient is compliant with the above medication (s) at the above dose and reports no medication side effects.  -Denies weight changes, cold./heat intolerance, skin changes, anxiety/palpitations *** -Last TSH: 6.66 6/24  MDD/GAD/ADHD/Hx of AUD: -Following with Psychiatry, Dr. Elna Breslow and [facility], last seen on 09/03/22 -Currently on Lamictal 100 mg, Zoloft 150 mg (just decreased recently), Amitriptyline 25 mg at night for sleep - EKG reviewed 09/04/22. -Participating in counseling - now on Suboxone 2-0.5 mg. Had been using kratom previously, last dose about 1 month ago.  History of Genital Herpes: -Uses Valtrex 500 mg as needed -Last flare in 2019, no current symptoms   Health Maintenance: -Blood work due -Colon cancer screening due  -Pap due -Mammogram due -Tdap due  Outpatient Encounter Medications as of 07/01/2023  Medication Sig   amitriptyline (ELAVIL) 50 MG tablet Take 50 mg by mouth at bedtime.   buprenorphine-naloxone (SUBOXONE) 2-0.5 mg SUBL SL tablet Place 1 tablet under the tongue daily.   ferrous sulfate 220 (44 Fe) MG/5ML solution Take 220 mg by mouth every other day.   Iron, Ferrous Sulfate, 325 (65 Fe) MG TABS Take 325 mg by mouth daily.   lamoTRIgine (LAMICTAL) 100 MG tablet Take 0.5 tablets (50 mg total) by mouth daily.   levothyroxine (SYNTHROID) 25 MCG tablet TAKE 1 TABLET(25 MCG) BY MOUTH DAILY   sertraline (ZOLOFT) 100 MG tablet Take 1 tablet (100 mg total) by mouth daily.   valACYclovir (VALTREX) 500 MG tablet Take 1 tablet (500 mg total) by mouth daily as needed.   No facility-administered encounter medications on file as of 07/01/2023.    Past Medical History:  Diagnosis  Date   ADHD (attention deficit hyperactivity disorder)    Anxiety    Depression    Herpes     Past Surgical History:  Procedure Laterality Date   TONSILLECTOMY     XI ROBOTIC LAPAROSCOPIC ASSISTED APPENDECTOMY N/A 12/10/2022   Procedure: XI ROBOTIC LAPAROSCOPIC ASSISTED APPENDECTOMY;  Surgeon: Carolan Shiver, MD;  Location: [facility] ORS;  Service: General;  Laterality: N/A;    Family History  Problem Relation Age of Onset   Anxiety disorder Mother    Depression Mother    Hypertension Mother    Anxiety disorder Father    Depression Father    Hypertension Father    Schizophrenia Paternal Grandmother    Depression Paternal Grandmother    Hearing loss Paternal Grandmother    Hypertension Paternal Grandmother    Depression Maternal Grandmother     Social History   Socioeconomic History   Marital status: Married    Spouse name: Tracey Lester   Number of children: 3   Years of education: Not on file   Highest education level: Associate degree: occupational, Scientist, product/process development, or vocational program  Occupational History   Not on file  Tobacco Use   Smoking status: Former    Current packs/day: 0.00    Types: Cigarettes    Quit date: 10/08/2006    Years since quitting: 16.7   Smokeless tobacco: Never  Vaping Use   Vaping status: Never Used  Substance and Sexual Activity   Alcohol use: Not Currently    Alcohol/week: 5.0 standard drinks of alcohol    Types: 4  Glasses of wine, 1 Cans of beer per week   Drug use: No   Sexual activity: Yes    Birth control/protection: None  Other Topics Concern   Not on file  Social History Narrative   Not on file   Social Determinants of Health   Financial Resource Strain: Low Risk  (11/12/2022)   Overall Financial Resource Strain (CARDIA)    Difficulty of Paying Living Expenses: Not hard at all  Food Insecurity: No Food Insecurity (12/10/2022)   Hunger Vital Sign    Worried About Running Out of Food in the Last Year: Never true    Ran Out of  Food in the Last Year: Never true  Transportation Needs: No Transportation Needs (12/10/2022)   PRAPARE - Administrator, Civil Service (Medical): No    Lack of Transportation (Non-Medical): No  Physical Activity: Sufficiently Active (11/12/2022)   Exercise Vital Sign    Days of Exercise per Week: 6 days    Minutes of Exercise per Session: 30 min  Stress: No Stress Concern Present (11/12/2022)   [employer] of Occupational Health - Occupational Stress Questionnaire    Feeling of Stress : Only a little  Social Connections: Socially Integrated (11/12/2022)   Social Connection and Isolation Panel [NHANES]    Frequency of Communication with Friends and Family: More than three times a week    Frequency of Social Gatherings with Friends and Family: More than three times a week    Attends Religious Services: More than 4 times per year    Active Member of [employer] or Organizations: Yes    Attends Banker Meetings: More than 4 times per year    Marital Status: Married  Catering manager Violence: Not At Risk (12/10/2022)   Humiliation, Afraid, Rape, and Kick questionnaire    Fear of Current or Ex-Partner: No    Emotionally Abused: No    Physically Abused: No    Sexually Abused: No    Review of Systems  All other systems reviewed and are negative.       Objective    There were no vitals taken for this visit.  Physical Exam Constitutional:      Appearance: Normal appearance.  HENT:     Head: Normocephalic and atraumatic.  Eyes:     Conjunctiva/sclera: Conjunctivae normal.  Cardiovascular:     Rate and Rhythm: Normal rate and regular rhythm.  Pulmonary:     Effort: Pulmonary effort is normal.     Breath sounds: Normal breath sounds.  Musculoskeletal:     Right lower leg: No edema.     Left lower leg: No edema.  Skin:    General: Skin is warm and dry.  Neurological:     General: No focal deficit present.     Mental Status: She is alert. Mental status  is at baseline.  Psychiatric:        Mood and Affect: Mood normal.        Behavior: Behavior normal.         Assessment & Plan:   1. GAD (generalized anxiety disorder)/High risk medication use: Following with psychiatry, note reviewed from 09/03/2022.  Patient is currently on Lamictal 100 mg, Zoloft 150 mg and amitriptyline 25 mg at night for sleep.  Patient asking if I would be able to prescribe these medications if needed, which I can do for her.  Patient is also participating in counseling through [facility] which is where she is also getting her  Suboxone. Due for screening labs, CBC, CMP today.    - CBC w/Diff/Platelet - COMPLETE METABOLIC PANEL WITH GFR  2. History of herpes genitalis: Stable, no flares since 2019.  Patient does not have Valtrex on hand, will refill today so she can take at the onset of symptoms.  - valACYclovir (VALTREX) 500 MG tablet; Take 1 tablet (500 mg total) by mouth daily as needed.  Dispense: 30 tablet; Refill: 0  3. Lipid screening: Lipid screening due today. - Lipid Profile  4. Encounter for screening mammogram for malignant neoplasm of breast: Mammogram ordered.  - MM 3D SCREEN BREAST BILATERAL; Future  5. Screening for colon cancer: Referral to GI placed for colonoscopy.  - Ambulatory referral to Gastroenterology  6. Encounter for hepatitis C screening test for low risk patient/Screening for HIV without presence of risk factors: Screening tests ordered.   - Hepatitis C Antibody - HIV antibody (with reflex)  7. Need for diphtheria-tetanus-pertussis (Tdap) vaccine: Tdap administered today.  - Tdap vaccine greater than or equal to 7yo IM  No follow-ups on file.   Margarita Mail, DO

## 2023-07-01 ENCOUNTER — Ambulatory Visit: Payer: Managed Care, Other (non HMO) | Admitting: Internal Medicine

## 2023-08-01 NOTE — Progress Notes (Deleted)
   Acute Office Visit  Subjective:     Patient ID: Tracey Lester, female    DOB: 09/07/1971, 52 y.o.   MRN: [id number]  No chief complaint on file.   HPI Patient is in today for fatigue. At LOV back in June, her TSH was found to be 6.63, T4 4.7. She was started on Levothyroxine 25 mcg but did not return for follow up for recheck of labs. Today she is complaining of constant fatigue.     Review of Systems  Constitutional:  Positive for malaise/fatigue.  All other systems reviewed and are negative.       Objective:    There were no vitals taken for this visit. BP Readings from Last 3 Encounters:  03/31/23 124/66  12/11/22 116/73  11/12/22 108/72   Wt Readings from Last 3 Encounters:  03/31/23 170 lb 11.2 oz (77.4 kg)  12/09/22 167 lb 8.8 oz (76 kg)  11/12/22 168 lb 14.4 oz (76.6 kg)      Physical Exam Constitutional:      Appearance: Normal appearance.  HENT:     Head: Normocephalic and atraumatic.  Eyes:     Conjunctiva/sclera: Conjunctivae normal.  Cardiovascular:     Rate and Rhythm: Normal rate and regular rhythm.  Pulmonary:     Effort: Pulmonary effort is normal.     Breath sounds: Normal breath sounds.  Skin:    General: Skin is warm and dry.  Neurological:     General: No focal deficit present.     Mental Status: She is alert. Mental status is at baseline.  Psychiatric:        Mood and Affect: Mood normal.        Behavior: Behavior normal.     No results found for any visits on 08/02/23.      Assessment & Plan:   1. Weight gain/Fatigue, unspecified type/MDD (major depressive disorder), recurrent, in full remission (HCC)/Vitamin D deficiency: Patient wanting to talk about GLP-1's, however due to her BMI and minimal weight gain she would not be a candidate for this.  Will obtain labs to rule out thyroid disorders and vitamins deficiencies that could be contributing to fatigue.  However, I believe her symptoms are secondary to her SSRIs and  recently discontinuing Adderall.  - TSH - Vitamin D (25 hydroxy) - CBC w/Diff/Platelet - Fe+TIBC+Fer - lamoTRIgine (LAMICTAL) 100 MG tablet; Take 0.5 tablets (50 mg total) by mouth daily.  Dispense: 90 tablet; Refill: 1 - sertraline (ZOLOFT) 100 MG tablet; Take 1 tablet (100 mg total) by mouth daily.   No follow-ups on file.  Margarita Mail, DO

## 2023-08-02 ENCOUNTER — Ambulatory Visit: Payer: Self-pay | Admitting: Internal Medicine

## 2023-11-02 ENCOUNTER — Other Ambulatory Visit: Payer: Self-pay | Admitting: Internal Medicine

## 2023-11-02 DIAGNOSIS — E039 Hypothyroidism, unspecified: Secondary | ICD-10-CM

## 2023-11-02 DIAGNOSIS — Z8619 Personal history of other infectious and parasitic diseases: Secondary | ICD-10-CM

## 2023-11-02 NOTE — Telephone Encounter (Signed)
 Medication Refill -  Most Recent Primary Care Visit:  Provider: BERNARDO FEND  Department: [facility]  Visit Type: OFFICE VISIT  Date: 03/31/2023  Medication: valACYclovir  (VALTREX ) 500 MG tablet levothyroxine  (SYNTHROID ) 25 MCG tablet  Has the patient contacted their pharmacy? Yes Pt informed to contact pharmacy.   Is this the correct pharmacy for this prescription? Yes This is the patient's preferred pharmacy:  [id number] - [place], [place] - [address] AT [facility] [address] [facility] [address] [place] [address] Phone: [phone] Fax: [phone]   Has the prescription been filled recently? No  Is the patient out of the medication? Yes  Has the patient been seen for an appointment in the last year OR does the patient have an upcoming appointment? Yes  Can we respond through MyChart? Yes  Agent: Please be advised that Rx refills may take up to 3 business days. We ask that you follow-up with your pharmacy.

## 2023-11-05 MED ORDER — VALACYCLOVIR HCL 500 MG PO TABS: 500.0000 mg | ORAL_TABLET | Freq: Every day | ORAL | 0 refills | Status: DC | PRN

## 2023-11-05 MED ORDER — LEVOTHYROXINE SODIUM 25 MCG PO TABS: 25.0000 ug | ORAL_TABLET | Freq: Every day | ORAL | 0 refills | Status: DC

## 2023-11-05 NOTE — Telephone Encounter (Signed)
 Requested Prescriptions  Pending Prescriptions Disp Refills   valACYclovir  (VALTREX ) 500 MG tablet 30 tablet 0    Sig: Take 1 tablet (500 mg total) by mouth daily as needed.     Antimicrobials:  Antiviral Agents - Anti-Herpetic Passed - 11/05/2023 11:44 AM      Passed - Valid encounter within last 12 months    Recent Outpatient Visits           7 months ago Fatigue, unspecified type   [facility] Bernardo Fend, DO   11 months ago Annual physical exam   [facility] Bernardo Fend, DO   1 year ago GAD (generalized anxiety disorder)   [facility] Bernardo Fend, DO               levothyroxine  (SYNTHROID ) 25 MCG tablet 90 tablet 1    Sig: Take 1 tablet (25 mcg total) by mouth daily before breakfast.     Endocrinology:  Hypothyroid Agents Failed - 11/05/2023 11:44 AM      Failed - TSH in normal range and within 360 days    TSH  Date Value Ref Range Status  03/31/2023 6.63 (H) mIU/L Final    Comment:              Reference Range .           > or = 20 Years  0.40-4.50 .                Pregnancy Ranges           First trimester    0.26-2.66           Second trimester   0.55-2.73           Third trimester    0.43-2.91          Passed - Valid encounter within last 12 months    Recent Outpatient Visits           7 months ago Fatigue, unspecified type   [facility] Bernardo Fend, DO   11 months ago Annual physical exam   [facility] Bernardo Fend, DO   1 year ago GAD (generalized anxiety disorder)   [facility] Bernardo Fend, [place]

## 2024-02-08 ENCOUNTER — Encounter: Payer: Self-pay | Admitting: Internal Medicine

## 2024-02-08 ENCOUNTER — Ambulatory Visit (INDEPENDENT_AMBULATORY_CARE_PROVIDER_SITE_OTHER): Payer: Self-pay | Admitting: Internal Medicine

## 2024-02-08 VITALS — BP 116/78 | HR 100 | Resp 16 | Ht 70.0 in | Wt 177.2 lb

## 2024-02-08 DIAGNOSIS — E039 Hypothyroidism, unspecified: Secondary | ICD-10-CM | POA: Diagnosis not present

## 2024-02-08 DIAGNOSIS — R140 Abdominal distension (gaseous): Secondary | ICD-10-CM

## 2024-02-08 DIAGNOSIS — Z13220 Encounter for screening for lipoid disorders: Secondary | ICD-10-CM

## 2024-02-08 DIAGNOSIS — R5383 Other fatigue: Secondary | ICD-10-CM

## 2024-02-08 DIAGNOSIS — Z1211 Encounter for screening for malignant neoplasm of colon: Secondary | ICD-10-CM

## 2024-02-08 DIAGNOSIS — E559 Vitamin D deficiency, unspecified: Secondary | ICD-10-CM | POA: Diagnosis not present

## 2024-02-08 NOTE — Progress Notes (Signed)
 Established Patient Office Visit  Subjective    Patient ID: Tracey Lester, female    DOB: Jul 03, 1971  Age: 53 y.o. MRN: [id number]  CC:  Chief Complaint  Patient presents with   Labs Only    HPI Tracey Lester presents to follow up on chronic medical conditions and to discuss fatigue.   Discussed the use of AI scribe software for clinical note transcription with the patient, who gave verbal consent to proceed.  History of Present Illness The patient, with a history of thyroid issues, presents with ongoing fatigue and bloating. She reports feeling "very, very, very tired" and describes her abdomen as "super sensitive," "big," and "hard." She also reports feeling bloated to the point of appearing "six months pregnant." The patient is currently on levothyroxine 25mg . She reports no issues with taking the medication.  The patient also has a history of sleep issues and is currently on antidepressant medication. She reports feeling tired to the point of needing to sleep from 8:30 am to 1 or 2 pm after dropping her kids off at school. She also reports a history of low Vitamin D and B12 levels and is currently taking supplements for these. She takes a B6 and B12 combination supplement daily and a Vitamin D supplement in a dropper form. She also takes a daily AG1 multivitamin and greens supplement.  The patient also reports a history of appendicitis. She was due for a colon cancer screening around the time of her appendicitis but had to cancel due to her condition. She is now considering rescheduling this screening. She is also due for a mammogram and is considering seeing a gynecologist for this. She is also considering seeing a GI specialist due to her ongoing bloating and abdominal discomfort.  MDD/GAD/ADHD/Hx of AUD: -Following with Psychiatry, Dr. Elna Breslow and [facility] -Currently on Lamictal 100 mg, Zoloft 150 mg  -Participating in counseling - now on Suboxone 2-0.5 mg. Had been using  kratom previously, last dose about 1 month ago.  Hypothyroidism: -Medications: Levothyroxine 25 mcg -Patient is compliant with the above medication (s) at the above dose and reports no medication side effects but is not taking in the morning prior to eating or taking other medications.  -Last TSH: 6.63 6/24  History of Genital Herpes: -Uses Valtrex 500 mg as needed -Last flare in 2019, no current symptoms   Health Maintenance: -Blood work due -Colon cancer screening due  -Pap UTD -Mammogram due  Outpatient Encounter Medications as of 02/08/2024  Medication Sig   amitriptyline (ELAVIL) 50 MG tablet Take 50 mg by mouth at bedtime.   buprenorphine-naloxone (SUBOXONE) 2-0.5 mg SUBL SL tablet Place 1 tablet under the tongue daily.   ferrous sulfate 220 (44 Fe) MG/5ML solution Take 220 mg by mouth every other day.   Iron, Ferrous Sulfate, 325 (65 Fe) MG TABS Take 325 mg by mouth daily.   lamoTRIgine (LAMICTAL) 100 MG tablet Take 0.5 tablets (50 mg total) by mouth daily.   levothyroxine (SYNTHROID) 25 MCG tablet Take 1 tablet (25 mcg total) by mouth daily before breakfast.   sertraline (ZOLOFT) 100 MG tablet Take 1 tablet (100 mg total) by mouth daily.   valACYclovir (VALTREX) 500 MG tablet Take 1 tablet (500 mg total) by mouth daily as needed.   No facility-administered encounter medications on file as of 02/08/2024.    Past Medical History:  Diagnosis Date   ADHD (attention deficit hyperactivity disorder)    Anxiety    Depression    Herpes  Past Surgical History:  Procedure Laterality Date   TONSILLECTOMY     XI ROBOTIC LAPAROSCOPIC ASSISTED APPENDECTOMY N/A 12/10/2022   Procedure: XI ROBOTIC LAPAROSCOPIC ASSISTED APPENDECTOMY;  Surgeon: Carolan Shiver, MD;  Location: [facility] ORS;  Service: General;  Laterality: N/A;    Family History  Problem Relation Age of Onset   Anxiety disorder Mother    Depression Mother    Hypertension Mother    Anxiety disorder Father     Depression Father    Hypertension Father    Schizophrenia Paternal Grandmother    Depression Paternal Grandmother    Hearing loss Paternal Grandmother    Hypertension Paternal Grandmother    Depression Maternal Grandmother     Social History   Socioeconomic History   Marital status: Married    Spouse name: michael   Number of children: 3   Years of education: Not on file   Highest education level: Associate degree: occupational, Scientist, product/process development, or vocational program  Occupational History   Not on file  Tobacco Use   Smoking status: Former    Current packs/day: 0.00    Types: Cigarettes    Quit date: 10/08/2006    Years since quitting: 17.3   Smokeless tobacco: Never  Vaping Use   Vaping status: Never Used  Substance and Sexual Activity   Alcohol use: Not Currently    Alcohol/week: 5.0 standard drinks of alcohol    Types: 4 Glasses of wine, 1 Cans of beer per week   Drug use: No   Sexual activity: Yes    Birth control/protection: None  Other Topics Concern   Not on file  Social History Narrative   Not on file   Social Drivers of Health   Financial Resource Strain: Low Risk  (11/12/2022)   Overall Financial Resource Strain (CARDIA)    Difficulty of Paying Living Expenses: Not hard at all  Food Insecurity: No Food Insecurity (12/10/2022)   Hunger Vital Sign    Worried About Running Out of Food in the Last Year: Never true    Ran Out of Food in the Last Year: Never true  Transportation Needs: No Transportation Needs (12/10/2022)   PRAPARE - Administrator, Civil Service (Medical): No    Lack of Transportation (Non-Medical): No  Physical Activity: Sufficiently Active (11/12/2022)   Exercise Vital Sign    Days of Exercise per Week: 6 days    Minutes of Exercise per Session: 30 min  Stress: Not on file (09/01/2023)  Social Connections: Socially Integrated (11/12/2022)   Social Connection and Isolation Panel [NHANES]    Frequency of Communication with Friends and  Family: More than three times a week    Frequency of Social Gatherings with Friends and Family: More than three times a week    Attends Religious Services: More than 4 times per year    Active Member of [employer] or Organizations: Yes    Attends Banker Meetings: More than 4 times per year    Marital Status: Married  Catering manager Violence: Not At Risk (12/10/2022)   Humiliation, Afraid, Rape, and Kick questionnaire    Fear of Current or Ex-Partner: No    Emotionally Abused: No    Physically Abused: No    Sexually Abused: No    Review of Systems  Constitutional:  Positive for malaise/fatigue.  Gastrointestinal:  Positive for abdominal pain. Negative for constipation, diarrhea, nausea and vomiting.        Objective    Pulse 100  Resp 16   Ht 5\' 10"  (1.778 m)   Wt 177 lb 3.2 oz (80.4 kg)   LMP 01/08/2024   SpO2 95%   BMI 25.43 kg/m   Physical Exam Constitutional:      Appearance: Normal appearance.  HENT:     Head: Normocephalic and atraumatic.     Mouth/Throat:     Mouth: Mucous membranes are moist.     Pharynx: Oropharynx is clear.  Eyes:     Extraocular Movements: Extraocular movements intact.     Conjunctiva/sclera: Conjunctivae normal.     Pupils: Pupils are equal, round, and reactive to light.  Neck:     Comments: No thyromegaly Cardiovascular:     Rate and Rhythm: Normal rate and regular rhythm.  Pulmonary:     Effort: Pulmonary effort is normal.     Breath sounds: Normal breath sounds.  Musculoskeletal:     Cervical back: No tenderness.     Right lower leg: No edema.     Left lower leg: No edema.  Lymphadenopathy:     Cervical: No cervical adenopathy.  Skin:    General: Skin is warm and dry.  Neurological:     General: No focal deficit present.     Mental Status: She is alert. Mental status is at baseline.  Psychiatric:        Mood and Affect: Mood normal.        Behavior: Behavior normal.         Assessment & Plan:    Assessment & Plan Abdominal Bloating and Tenderness Chronic bloating and tenderness suggest possible food sensitivities or intolerances. - Order comprehensive food allergy panel and celiac panel. - Refer to GI for colon cancer screening.  Hypothyroidism Elevated TSH indicates hypothyroidism. Current levothyroxine dose may be insufficient due to persistent fatigue. - Recheck thyroid panel including TSH, T3, and T4 levels. - Continue levothyroxine 25 mcg daily, taken 30 minutes to an hour before eating or taking other medications. - Adjust levothyroxine dosage based on lab results.  Vitamin D Insufficiency Vitamin D levels previously lower end of normal. Reassessment needed to determine supplementation necessity. - Recheck vitamin D levels. - Consider prescription vitamin D supplementation if levels are below 30 ng/mL. - Recommend over-the-counter vitamin D supplement if levels remain low but above 30 ng/mL.  General Health Maintenance Due for routine health screenings including mammogram and cholesterol check. - Order cholesterol test. - Recommend scheduling a mammogram.   - Vitamin B12 - Thyroid Panel With TSH - CBC w/Diff/Platelet - COMPLETE METABOLIC PANEL WITHOUT GFR - Food Allergy Profile - Celiac Disease Panel - Vitamin D (25 hydroxy) - Lipid Profile - Ambulatory referral to Gastroenterology   Return in about 6 months (around 08/09/2024).   Rockney Cid, DO

## 2024-02-09 LAB — CBC WITH DIFFERENTIAL/PLATELET
Absolute Lymphocytes: 1634 {cells}/uL (ref 850–3900)
Absolute Monocytes: 722 {cells}/uL (ref 200–950)
Basophils Absolute: 91 {cells}/uL (ref 0–200)
Basophils Relative: 1.2 %
Eosinophils Absolute: 175 {cells}/uL (ref 15–500)
Eosinophils Relative: 2.3 %
HCT: 40.5 % (ref 35.0–45.0)
Hemoglobin: 13.5 g/dL (ref 11.7–15.5)
MCH: 31 pg (ref 27.0–33.0)
MCHC: 33.3 g/dL (ref 32.0–36.0)
MCV: 92.9 fL (ref 80.0–100.0)
MPV: 10.2 fL (ref 7.5–12.5)
Monocytes Relative: 9.5 %
Neutro Abs: 4978 {cells}/uL (ref 1500–7800)
Neutrophils Relative %: 65.5 %
Platelets: 296 10*3/uL (ref 140–400)
RBC: 4.36 10*6/uL (ref 3.80–5.10)
RDW: 12.4 % (ref 11.0–15.0)
Total Lymphocyte: 21.5 %
WBC: 7.6 10*3/uL (ref 3.8–10.8)

## 2024-02-09 LAB — COMPLETE METABOLIC PANEL WITHOUT GFR
AG Ratio: 1.9 (calc) (ref 1.0–2.5)
ALT: 10 U/L (ref 6–29)
AST: 18 U/L (ref 10–35)
Albumin: 4.4 g/dL (ref 3.6–5.1)
Alkaline phosphatase (APISO): 85 U/L (ref 37–153)
BUN: 11 mg/dL (ref 7–25)
CO2: 30 mmol/L (ref 20–32)
Calcium: 10.3 mg/dL (ref 8.6–10.4)
Chloride: 100 mmol/L (ref 98–110)
Creat: 0.7 mg/dL (ref 0.50–1.03)
Globulin: 2.3 g/dL (ref 1.9–3.7)
Glucose, Bld: 97 mg/dL (ref 65–99)
Potassium: 4.2 mmol/L (ref 3.5–5.3)
Sodium: 136 mmol/L (ref 135–146)
Total Bilirubin: 0.3 mg/dL (ref 0.2–1.2)
Total Protein: 6.7 g/dL (ref 6.1–8.1)

## 2024-02-09 LAB — FOOD ALLERGY PROFILE

## 2024-02-09 LAB — THYROID PANEL WITH TSH
Free Thyroxine Index: 1.9 (ref 1.4–3.8)
T4, Total: 5.9 ug/dL (ref 5.1–11.9)
TSH: 33 m[IU]/L — ABNORMAL HIGH (ref 22–35)
TSH: 5.16 m[IU]/L — ABNORMAL HIGH

## 2024-02-09 LAB — CELIAC DISEASE PANEL
(tTG) Ab, IgA: <1 U/mL
(tTG) Ab, IgG: <1 U/mL
Gliadin IgA: <1 U/mL
Gliadin IgG: <1 U/mL
Immunoglobulin A: 46 mg/dL — ABNORMAL LOW (ref 47–310)

## 2024-02-09 LAB — VITAMIN B12: Vitamin B-12: 1448 pg/mL — ABNORMAL HIGH (ref 200–1100)

## 2024-02-09 LAB — LIPID PANEL
Cholesterol: 166 mg/dL (ref <200)
HDL: 46 mg/dL — ABNORMAL LOW (ref >=50)
LDL Cholesterol (Calc): 97 mg/dL
Non-HDL Cholesterol (Calc): 120 mg/dL (ref <130)
Total CHOL/HDL Ratio: 3.6 (calc) (ref <5.0)
Triglycerides: 130 mg/dL (ref <150)

## 2024-02-09 LAB — INTERPRETATION:

## 2024-02-09 LAB — VITAMIN D 25 HYDROXY (VIT D DEFICIENCY, FRACTURES): Vit D, 25-Hydroxy: 46 ng/mL (ref 30–100)

## 2024-02-09 MED ORDER — LEVOTHYROXINE SODIUM 50 MCG PO TABS: 50.0000 ug | ORAL_TABLET | Freq: Every day | ORAL | 1 refills | Status: DC

## 2024-02-09 NOTE — Addendum Note (Signed)
 Addended by: Rockney Cid on: 02/09/2024 07:58 AM   Modules accepted: Orders

## 2024-02-10 ENCOUNTER — Encounter: Payer: Self-pay | Admitting: Internal Medicine

## 2024-02-22 ENCOUNTER — Encounter: Payer: Self-pay | Admitting: *Deleted

## 2024-03-21 NOTE — Progress Notes (Unsigned)
 There were no vitals taken for this visit.   Subjective:    Patient ID: Tracey Lester, female    DOB: 07/03/1971, 53 y.o.   MRN: [id number]  HPI: Tracey Lester is a 53 y.o. female  No chief complaint on file.   Discussed the use of AI scribe software for clinical note transcription with the patient, who gave verbal consent to proceed.  History of Present Illness          02/08/2024    1:51 PM 03/31/2023   11:13 AM 02/05/2023    9:16 AM  Depression screen PHQ 2/9  Decreased Interest 0 0   Down, Depressed, Hopeless 0 0   PHQ - 2 Score 0 0   Altered sleeping 0 0   Tired, decreased energy 0 0   Change in appetite 0 0   Feeling bad or failure about yourself  0 0   Trouble concentrating 0 0   Moving slowly or fidgety/restless 0 0   Suicidal thoughts 0 0   PHQ-9 Score 0 0   Difficult doing work/chores Not difficult at all Not difficult at all      Information is confidential and restricted. Go to Review Flowsheets to unlock data.    Relevant past medical, surgical, family and social history reviewed and updated as indicated. Interim medical history since our last visit reviewed. Allergies and medications reviewed and updated.  Review of Systems  Per HPI unless specifically indicated above     Objective:      There were no vitals taken for this visit.  {Vitals History (Optional):23777} Wt Readings from Last 3 Encounters:  02/08/24 177 lb 3.2 oz (80.4 kg)  03/31/23 170 lb 11.2 oz (77.4 kg)  12/09/22 167 lb 8.8 oz (76 kg)    Physical Exam Physical Exam    Results for orders placed or performed in visit on 02/08/24  Thyroid  Panel With TSH   Collection Time: 02/08/24  2:14 PM  Result Value Ref Range   T3 Uptake 33 22 - 35 %   T4, Total 5.9 5.1 - 11.9 mcg/dL   Free Thyroxine Index 1.9 1.4 - 3.8   TSH 5.16 (H) mIU/L  CBC w/Diff/Platelet   Collection Time: 02/08/24  2:14 PM  Result Value Ref Range   WBC 7.6 3.8 - 10.8 Thousand/uL   RBC 4.36 3.80 - 5.10  Million/uL   Hemoglobin 13.5 11.7 - 15.5 g/dL   HCT 40.9 81.1 - 91.4 %   MCV 92.9 80.0 - 100.0 fL   MCH 31.0 27.0 - 33.0 pg   MCHC 33.3 32.0 - 36.0 g/dL   RDW 78.2 95.6 - 21.3 %   Platelets 296 140 - 400 Thousand/uL   MPV 10.2 7.5 - 12.5 fL   Neutro Abs 4,978 1,500 - 7,800 cells/uL   Absolute Lymphocytes 1,634 850 - 3,900 cells/uL   Absolute Monocytes 722 200 - 950 cells/uL   Eosinophils Absolute 175 15 - 500 cells/uL   Basophils Absolute 91 0 - 200 cells/uL   Neutrophils Relative % 65.5 %   Total Lymphocyte 21.5 %   Monocytes Relative 9.5 %   Eosinophils Relative 2.3 %   Basophils Relative 1.2 %  COMPLETE METABOLIC PANEL WITHOUT GFR   Collection Time: 02/08/24  2:14 PM  Result Value Ref Range   Glucose, Bld 97 65 - 99 mg/dL   BUN 11 7 - 25 mg/dL   Creat 0.86 5.78 - 4.69 mg/dL   BUN/Creatinine Ratio SEE NOTE: 6 -  22 (calc)   Sodium 136 135 - 146 mmol/L   Potassium 4.2 3.5 - 5.3 mmol/L   Chloride 100 98 - 110 mmol/L   CO2 30 20 - 32 mmol/L   Calcium 10.3 8.6 - 10.4 mg/dL   Total Protein 6.7 6.1 - 8.1 g/dL   Albumin 4.4 3.6 - 5.1 g/dL   Globulin 2.3 1.9 - 3.7 g/dL (calc)   AG Ratio 1.9 1.0 - 2.5 (calc)   Total Bilirubin 0.3 0.2 - 1.2 mg/dL   Alkaline phosphatase (APISO) 85 37 - 153 U/L   AST 18 10 - 35 U/L   ALT 10 6 - 29 U/L  Vitamin D  (25 hydroxy)   Collection Time: 02/08/24  2:14 PM  Result Value Ref Range   Vit D, 25-Hydroxy 46 30 - 100 ng/mL  Vitamin B12   Collection Time: 02/08/24  2:14 PM  Result Value Ref Range   Vitamin B-12 1,448 (H) 200 - 1,100 pg/mL  Lipid Profile   Collection Time: 02/08/24  2:14 PM  Result Value Ref Range   Cholesterol 166 <200 mg/dL   HDL 46 (L) > OR = 50 mg/dL   Triglycerides 161 <096 mg/dL   LDL Cholesterol (Calc) 97 mg/dL (calc)   Total CHOL/HDL Ratio 3.6 <5.0 (calc)   Non-HDL Cholesterol (Calc) 120 <130 mg/dL (calc)  Food Allergy  Profile   Collection Time: 02/08/24  2:14 PM  Result Value Ref Range   Egg White IgE <0.10 kU/L    Class 0    Peanut IgE <0.10 kU/L   Class 0    Wheat IgE <0.10 kU/L   CLASS 0    Walnut <0.10 kU/L   CLASS 0    Fish Cod <0.10 kU/L   CLASS 0    Milk IgE <0.10 kU/L   Class 0    Soybean IgE <0.10 kU/L   CLASS 0    Shrimp IgE <0.10 kU/L   Class 0    Scallop IgE <0.10 kU/L   CLASS 0    Sesame Seed f10  <0.10 kU/L   CLASS 0    Hazelnut <0.10 kU/L   CLASS 0    Cashew IgE <0.10 kU/L   CLASS 0    Almonds <0.10 kU/L   CLASS 0    Allergen, Salmon, f41 <0.10 kU/L   CLASS 0    Tuna IgE <0.10 kU/L   CLASS 0    [place] Nut <0.10 kU/L   Class 0    Macadamia Nut  <0.10 kU/L   CLASS 0   Celiac Disease Panel   Collection Time: 02/08/24  2:14 PM  Result Value Ref Range   Immunoglobulin A 46 (L) 47 - 310 mg/dL   Gliadin IgA <0.4 U/mL   Gliadin IgG <1.0 U/mL   (tTG) Ab, IgG <1.0 U/mL   (tTG) Ab, IgA <1.0 U/mL  Interpretation:   Collection Time: 02/08/24  2:14 PM  Result Value Ref Range   Interpretation     {Labs (Optional):23779}       Assessment & Plan:   Problem List Items Addressed This Visit   None    Assessment and Plan Assessment & Plan         Follow up plan: No follow-ups on file.

## 2024-03-22 ENCOUNTER — Encounter: Payer: Self-pay | Admitting: Nurse Practitioner

## 2024-03-22 ENCOUNTER — Ambulatory Visit (INDEPENDENT_AMBULATORY_CARE_PROVIDER_SITE_OTHER): Admitting: Nurse Practitioner

## 2024-03-22 VITALS — BP 116/78 | HR 103 | Temp 97.8°F | Ht 70.0 in | Wt 176.0 lb

## 2024-03-22 DIAGNOSIS — R21 Rash and other nonspecific skin eruption: Secondary | ICD-10-CM | POA: Diagnosis not present

## 2024-03-22 MED ORDER — CLINDAMYCIN PHOS (ONCE-DAILY) 1 % EX GEL: 1.0000 | Freq: Every day | CUTANEOUS | 0 refills | Status: DC

## 2024-03-22 NOTE — Progress Notes (Signed)
 BP 116/78 (BP Location: Left Arm, Patient Position: Sitting, Cuff Size: Normal)   Pulse (!) 103   Temp 97.8 F (36.6 C) (Oral)   Ht 5\' 10"  (1.778 m)   Wt 176 lb (79.8 kg)   SpO2 95%   BMI 25.25 kg/m    Subjective:    Patient ID: Tracey Lester, female    DOB: 16-Oct-1971, 54 y.o.   MRN: [id number]  HPI: Tracey Lester is a 53 y.o. female  Chief Complaint  Patient presents with   Rash    In crease between thigh and vagina, under R breast as well. Used lotrimin not helping    Rash: -pus filled red bumps in groin area and under right breast, will easily burst if pressure applied  -denies itchiness and pain unless pressure applied  -treated with Lotrimin cream at home w/ no improvement         03/22/2024    2:44 PM 02/08/2024    1:51 PM 03/31/2023   11:13 AM  Depression screen PHQ 2/9  Decreased Interest 0 0 0  Down, Depressed, Hopeless 0 0 0  PHQ - 2 Score 0 0 0  Altered sleeping 0 0 0  Tired, decreased energy 0 0 0  Change in appetite 0 0 0  Feeling bad or failure about yourself  0 0 0  Trouble concentrating 0 0 0  Moving slowly or fidgety/restless 0 0 0  Suicidal thoughts 0 0 0  PHQ-9 Score 0 0 0  Difficult doing work/chores Not difficult at all Not difficult at all Not difficult at all    Relevant past medical, surgical, family and social history reviewed and updated as indicated. Interim medical history since our last visit reviewed. Allergies and medications reviewed and updated.  Review of Systems  Ten systems reviewed and is negative except as mentioned in HPI      Objective:      BP 116/78 (BP Location: Left Arm, Patient Position: Sitting, Cuff Size: Normal)   Pulse (!) 103   Temp 97.8 F (36.6 C) (Oral)   Ht 5\' 10"  (1.778 m)   Wt 176 lb (79.8 kg)   SpO2 95%   BMI 25.25 kg/m    Wt Readings from Last 3 Encounters:  03/22/24 176 lb (79.8 kg)  02/08/24 177 lb 3.2 oz (80.4 kg)  03/31/23 170 lb 11.2 oz (77.4 kg)    Physical  Exam Constitutional:      Appearance: Normal appearance.  Cardiovascular:     Rate and Rhythm: Normal rate and regular rhythm.  Pulmonary:     Effort: Pulmonary effort is normal.     Breath sounds: Normal breath sounds.  Skin:    General: Skin is warm and dry.     Findings: Rash present. Rash is pustular.     Comments: Red pustule rash in groin area and under r breast  Neurological:     Mental Status: She is alert.      Results for orders placed or performed in visit on 02/08/24  Thyroid  Panel With TSH   Collection Time: 02/08/24  2:14 PM  Result Value Ref Range   T3 Uptake 33 22 - 35 %   T4, Total 5.9 5.1 - 11.9 mcg/dL   Free Thyroxine Index 1.9 1.4 - 3.8   TSH 5.16 (H) mIU/L  CBC w/Diff/Platelet   Collection Time: 02/08/24  2:14 PM  Result Value Ref Range   WBC 7.6 3.8 - 10.8 Thousand/uL   RBC 4.36 3.80 -  5.10 Million/uL   Hemoglobin 13.5 11.7 - 15.5 g/dL   HCT 40.9 81.1 - 91.4 %   MCV 92.9 80.0 - 100.0 fL   MCH 31.0 27.0 - 33.0 pg   MCHC 33.3 32.0 - 36.0 g/dL   RDW 78.2 95.6 - 21.3 %   Platelets 296 140 - 400 Thousand/uL   MPV 10.2 7.5 - 12.5 fL   Neutro Abs 4,978 1,500 - 7,800 cells/uL   Absolute Lymphocytes 1,634 850 - 3,900 cells/uL   Absolute Monocytes 722 200 - 950 cells/uL   Eosinophils Absolute 175 15 - 500 cells/uL   Basophils Absolute 91 0 - 200 cells/uL   Neutrophils Relative % 65.5 %   Total Lymphocyte 21.5 %   Monocytes Relative 9.5 %   Eosinophils Relative 2.3 %   Basophils Relative 1.2 %  COMPLETE METABOLIC PANEL WITHOUT GFR   Collection Time: 02/08/24  2:14 PM  Result Value Ref Range   Glucose, Bld 97 65 - 99 mg/dL   BUN 11 7 - 25 mg/dL   Creat 0.86 5.78 - 4.69 mg/dL   BUN/Creatinine Ratio SEE NOTE: 6 - 22 (calc)   Sodium 136 135 - 146 mmol/L   Potassium 4.2 3.5 - 5.3 mmol/L   Chloride 100 98 - 110 mmol/L   CO2 30 20 - 32 mmol/L   Calcium 10.3 8.6 - 10.4 mg/dL   Total Protein 6.7 6.1 - 8.1 g/dL   Albumin 4.4 3.6 - 5.1 g/dL   Globulin 2.3  1.9 - 3.7 g/dL (calc)   AG Ratio 1.9 1.0 - 2.5 (calc)   Total Bilirubin 0.3 0.2 - 1.2 mg/dL   Alkaline phosphatase (APISO) 85 37 - 153 U/L   AST 18 10 - 35 U/L   ALT 10 6 - 29 U/L  Vitamin D  (25 hydroxy)   Collection Time: 02/08/24  2:14 PM  Result Value Ref Range   Vit D, 25-Hydroxy 46 30 - 100 ng/mL  Vitamin B12   Collection Time: 02/08/24  2:14 PM  Result Value Ref Range   Vitamin B-12 1,448 (H) 200 - 1,100 pg/mL  Lipid Profile   Collection Time: 02/08/24  2:14 PM  Result Value Ref Range   Cholesterol 166 <200 mg/dL   HDL 46 (L) > OR = 50 mg/dL   Triglycerides 629 <528 mg/dL   LDL Cholesterol (Calc) 97 mg/dL (calc)   Total CHOL/HDL Ratio 3.6 <5.0 (calc)   Non-HDL Cholesterol (Calc) 120 <130 mg/dL (calc)  Food Allergy  Profile   Collection Time: 02/08/24  2:14 PM  Result Value Ref Range   Egg White IgE <0.10 kU/L   Class 0    Peanut IgE <0.10 kU/L   Class 0    Wheat IgE <0.10 kU/L   CLASS 0    Walnut <0.10 kU/L   CLASS 0    Fish Cod <0.10 kU/L   CLASS 0    Milk IgE <0.10 kU/L   Class 0    Soybean IgE <0.10 kU/L   CLASS 0    Shrimp IgE <0.10 kU/L   Class 0    Scallop IgE <0.10 kU/L   CLASS 0    Sesame Seed f10  <0.10 kU/L   CLASS 0    Hazelnut <0.10 kU/L   CLASS 0    Cashew IgE <0.10 kU/L   CLASS 0    Almonds <0.10 kU/L   CLASS 0    Allergen, Salmon, f41 <0.10 kU/L   CLASS 0    Tuna IgE <  0.10 kU/L   CLASS 0    [place] Nut <0.10 kU/L   Class 0    Macadamia Nut  <0.10 kU/L   CLASS 0   Celiac Disease Panel   Collection Time: 02/08/24  2:14 PM  Result Value Ref Range   Immunoglobulin A 46 (L) 47 - 310 mg/dL   Gliadin IgA <1.1 U/mL   Gliadin IgG <1.0 U/mL   (tTG) Ab, IgG <1.0 U/mL   (tTG) Ab, IgA <1.0 U/mL  Interpretation:   Collection Time: 02/08/24  2:14 PM  Result Value Ref Range   Interpretation            Assessment & Plan:   Problem List Items Addressed This Visit   None Visit Diagnoses       Rash    -  Primary   Recurrent rash w/  pustules in groin area and under R breast. Tried antifungals with no relief. Starting on clindamycin gel        Assessment and Plan  -Apply Clindamycin gel to affected areas daily  -Shower after workouts  -Keep areas clean and dry -if no improvement may consider RPR and HSV swab       Follow up plan: Return if symptoms worsen or fail to improve.  I have reviewed this encounter including the documentation in this note and/or discussed this patient with the provider, Aislinn Womack, SNP, I am certifying that I agree with the content of this note as supervising/preceptor nurse practitioner.  Donny Gall, FNP-C [facility] 03/22/2024, 3:15 PM

## 2024-03-23 ENCOUNTER — Other Ambulatory Visit: Payer: Self-pay | Admitting: Nurse Practitioner

## 2024-03-23 ENCOUNTER — Telehealth: Payer: Self-pay | Admitting: Pharmacy Technician

## 2024-03-23 ENCOUNTER — Other Ambulatory Visit (HOSPITAL_COMMUNITY): Payer: Self-pay

## 2024-03-23 DIAGNOSIS — R21 Rash and other nonspecific skin eruption: Secondary | ICD-10-CM

## 2024-03-23 MED ORDER — CLINDAMYCIN PHOSPHATE 1 % EX SWAB: 1.0000 | Freq: Two times a day (BID) | CUTANEOUS | 0 refills | Status: DC

## 2024-03-23 NOTE — Telephone Encounter (Signed)
 Pharmacy Patient Advocate Encounter   Received notification from CoverMyMeds that prior authorization for Clindamycin Phos (Once-Daily) 1% gel is required/requested.   Insurance verification completed.   The patient is insured through [facility] .   Per test claim:  EPIDUO GEL, CLINDAMYCIN 1% SWAB or CLINDAMYCIN 1% SOLUTION is preferred by the insurance.  If suggested medication is appropriate, Please send in a new RX and discontinue this one. If not, please advise as to why it's not appropriate so that we may request a Prior Authorization. Please note, some preferred medications may still require a PA.  If the suggested medications have not been trialed and there are no contraindications to their use, the PA will not be submitted, as it will not be approved.

## 2024-03-23 NOTE — Telephone Encounter (Signed)
**Note De-identified  [place] Obfuscation** Please advise 

## 2024-04-05 ENCOUNTER — Other Ambulatory Visit: Payer: Self-pay | Admitting: Internal Medicine

## 2024-04-05 DIAGNOSIS — E039 Hypothyroidism, unspecified: Secondary | ICD-10-CM

## 2024-04-07 NOTE — Telephone Encounter (Signed)
 Requested medication (s) are due for refill today: yes  Requested medication (s) are on the active medication list: yes  Last refill:  02/09/24 #30 1 refills  Future visit scheduled: yes 08/08/24  Notes to clinic:  last OV 02/08/23. Do you want to give more refills until next OV?     Requested Prescriptions  Pending Prescriptions Disp Refills   levothyroxine  (SYNTHROID ) 50 MCG tablet [Pharmacy Med Name: LEVOTHYROXINE  50 MCG TABLET] 30 tablet 1    Sig: TAKE 1 TABLET BY MOUTH DAILY     Endocrinology:  Hypothyroid Agents Failed - 04/07/2024 12:13 PM      Failed - TSH in normal range and within 360 days    TSH  Date Value Ref Range Status  02/08/2024 5.16 (H) mIU/L Final    Comment:              Reference Range .           > or = 20 Years  0.40-4.50 .                Pregnancy Ranges           First trimester    0.26-2.66           Second trimester   0.55-2.73           Third trimester    0.43-2.91          Failed - Valid encounter within last 12 months    Recent Outpatient Visits           2 weeks ago Rash   [facility] Quinton Buckler, FNP   1 month ago Fatigue, unspecified type   [facility] Rockney Cid, [place]

## 2024-04-07 NOTE — Telephone Encounter (Signed)
 2nd request came from [facility]

## 2024-06-12 ENCOUNTER — Other Ambulatory Visit: Payer: Self-pay | Admitting: Nurse Practitioner

## 2024-06-12 DIAGNOSIS — R21 Rash and other nonspecific skin eruption: Secondary | ICD-10-CM

## 2024-06-13 ENCOUNTER — Other Ambulatory Visit: Payer: Self-pay | Admitting: Internal Medicine

## 2024-06-13 DIAGNOSIS — R21 Rash and other nonspecific skin eruption: Secondary | ICD-10-CM

## 2024-06-13 MED ORDER — CLINDAMYCIN PHOSPHATE 1 % EX SWAB: 1.0000 | Freq: Two times a day (BID) | CUTANEOUS | 0 refills | Status: DC

## 2024-06-13 NOTE — Telephone Encounter (Signed)
 Copied from CRM [id number]. Topic: Clinical - Medication Question >> Jun 13, 2024 11:35 AM Essie A wrote: Reason for CRM: Patient is calling to get the status of her prescription, clindamycin  (CLEOCIN  T) 1 % SWAB.  Please call her at [phone].  Thanks.

## 2024-06-14 NOTE — Telephone Encounter (Signed)
 Requested medication (s) are due for refill today: No  Requested medication (s) are on the active medication list: Yes  Last refill:  06/13/24  Future visit scheduled: Yes  Notes to clinic:  Manual review    Requested Prescriptions  Pending Prescriptions Disp Refills   clindamycin  (CLEOCIN  T) 1 % SWAB [Pharmacy Med Name: CLINDAMYCIN  PHOS 1% PLEDGET] 60 each 0    Sig: APPLY TO THE AFFECTED AREA(S) 2 TIMES A DAY     Off-Protocol Failed - 06/14/2024  1:33 PM      Failed - Medication not assigned to a protocol, review manually.      Passed - Valid encounter within last 12 months    Recent Outpatient Visits           2 months ago Rash   [facility] Gareth Mliss FALCON, FNP   4 months ago Fatigue, unspecified type   [facility] Bernardo Fend, DO              [facility]

## 2024-07-13 ENCOUNTER — Telehealth: Payer: Self-pay

## 2024-07-13 ENCOUNTER — Other Ambulatory Visit: Payer: Self-pay

## 2024-07-13 ENCOUNTER — Other Ambulatory Visit: Payer: Self-pay | Admitting: Internal Medicine

## 2024-07-13 DIAGNOSIS — Z1231 Encounter for screening mammogram for malignant neoplasm of breast: Secondary | ICD-10-CM

## 2024-07-13 DIAGNOSIS — Z1211 Encounter for screening for malignant neoplasm of colon: Secondary | ICD-10-CM

## 2024-07-13 MED ORDER — NA SULFATE-K SULFATE-MG SULF 17.5-3.13-1.6 GM/177ML PO SOLN: 1.0000 | Freq: Once | ORAL | 0 refills | Status: AC

## 2024-07-13 NOTE — Telephone Encounter (Signed)
 Pt requesting call back to schedule colonoscopy

## 2024-07-13 NOTE — Telephone Encounter (Signed)
 Gastroenterology Pre-Procedure Review  Request Date: 10/02/24 Requesting Physician: Dr. Melany  PATIENT REVIEW QUESTIONS: The patient responded to the following health history questions as indicated:    1. Are you having any GI issues? no 2. Do you have a personal history of Polyps? no 3. Do you have a family history of Colon Cancer or Polyps? no 4. Diabetes Mellitus? no 5. Joint replacements in the past 12 months?no 6. Major health problems in the past 3 months?no 7. Any artificial heart valves, MVP, or defibrillator?no    MEDICATIONS & ALLERGIES:    Patient reports the following regarding taking any anticoagulation/antiplatelet therapy:   Plavix, Coumadin, Eliquis, Xarelto, Lovenox , Pradaxa, Brilinta, or Effient? no Aspirin? no  Patient confirms/reports the following medications:  Current Outpatient Medications  Medication Sig Dispense Refill   Na Sulfate-K Sulfate-Mg Sulfate concentrate (SUPREP) 17.5-3.13-1.6 GM/177ML SOLN Take 1 kit (354 mLs total) by mouth once for 1 dose. 354 mL 0   amitriptyline  (ELAVIL ) 50 MG tablet Take 100 mg by mouth at bedtime.     brexpiprazole (REXULTI) 1 MG TABS tablet Take 1 mg by mouth daily.     buprenorphine -naloxone  (SUBOXONE ) 2-0.5 mg SUBL SL tablet Place 1 tablet under the tongue daily.     clindamycin  (CLEOCIN  T) 1 % SWAB Apply 1 Application topically 2 (two) times daily. 60 each 0   ferrous sulfate  220 (44 Fe) MG/5ML solution Take 220 mg by mouth every other day.     Iron , Ferrous Sulfate , 325 (65 Fe) MG TABS Take 325 mg by mouth daily. 30 tablet 3   lamoTRIgine  (LAMICTAL ) 100 MG tablet Take 100 mg by mouth daily. 90 tablet 1   levothyroxine  (SYNTHROID ) 50 MCG tablet Take 1 tablet (50 mcg total) by mouth daily. 30 tablet 1   sertraline  (ZOLOFT ) 100 MG tablet Take 1 tablet (100 mg total) by mouth daily.     valACYclovir  (VALTREX ) 500 MG tablet Take 1 tablet (500 mg total) by mouth daily as needed. 30 tablet 0   No current  facility-administered medications for this visit.    Patient confirms/reports the following allergies:  Allergies  Allergen Reactions   Codeine Itching    No orders of the defined types were placed in this encounter.   AUTHORIZATION INFORMATION Primary Insurance: 1D#: Group #:  Secondary Insurance: 1D#: Group #:  SCHEDULE INFORMATION: Date: 10/02/24 Time: Location: [facility]

## 2024-08-04 ENCOUNTER — Ambulatory Visit
Admission: RE | Admit: 2024-08-04 | Discharge: 2024-08-04 | Disposition: A | Discharge status: Home / Self Care | Source: Ambulatory Visit | Attending: Internal Medicine | Admitting: Internal Medicine

## 2024-08-04 DIAGNOSIS — Z1231 Encounter for screening mammogram for malignant neoplasm of breast: Secondary | ICD-10-CM | POA: Insufficient documentation

## 2024-08-08 ENCOUNTER — Ambulatory Visit (INDEPENDENT_AMBULATORY_CARE_PROVIDER_SITE_OTHER): Admitting: Internal Medicine

## 2024-08-08 ENCOUNTER — Encounter: Payer: Self-pay | Admitting: Internal Medicine

## 2024-08-08 ENCOUNTER — Other Ambulatory Visit: Payer: Self-pay

## 2024-08-08 ENCOUNTER — Ambulatory Visit: Payer: Self-pay | Admitting: Internal Medicine

## 2024-08-08 VITALS — BP 120/82 | HR 97 | Temp 98.6°F | Resp 16 | Ht 70.0 in | Wt 168.7 lb

## 2024-08-08 DIAGNOSIS — E039 Hypothyroidism, unspecified: Secondary | ICD-10-CM

## 2024-08-08 DIAGNOSIS — Z8619 Personal history of other infectious and parasitic diseases: Secondary | ICD-10-CM | POA: Diagnosis not present

## 2024-08-08 MED ORDER — VALACYCLOVIR HCL 500 MG PO TABS: 500.0000 mg | ORAL_TABLET | Freq: Every day | ORAL | 0 refills | Status: AC | PRN

## 2024-08-08 MED ORDER — LEVOTHYROXINE SODIUM 50 MCG PO TABS: 50.0000 ug | ORAL_TABLET | Freq: Every day | ORAL | 1 refills | Status: AC

## 2024-08-08 NOTE — Progress Notes (Signed)
 Established Patient Office Visit  Subjective    Patient ID: Tracey Lester, female    DOB: Apr 04, 1971  Age: 53 y.o. MRN: [id number]  CC:  Chief Complaint  Patient presents with   Medical Management of Chronic Issues    6 month    HPI Tracey Lester presents to follow up on chronic medical conditions and to discuss fatigue.   Discussed the use of AI scribe software for clinical note transcription with the patient, who gave verbal consent to proceed.  History of Present Illness  Tracey Lester is a 53 year old female with hypothyroidism who presents for medication refill and follow-up.  She is currently out of her thyroid  medication, which was previously increased to 50 mcg due to a TSH level of 5.1. Fatigue was noted, prompting the dosage increase. She has not been taking her medication recently, so her TSH levels were not rechecked today.  Her psychiatric medications include Lamictal  150 mg, Zoloft  100 mg, and amitriptyline  at night. She also takes Adderall 30 mg twice a day. She previously discontinued Rexulti. These medications are managed by a PA at [facility].  She takes Valtrex  as needed and has not had any outbreaks or flares since 2019. Occasional itching and nerve sensitivity occur in the area affected by previous shingles, especially during stress. No recent outbreaks or flares of herpes simplex virus are reported.  MDD/GAD/ADHD/Hx of AUD: -Following with Psychiatry, [facility] -Currently on Lamictal  150 mg, Zoloft  100 mg, Amitriptyline  100 mg, Adderall 30 mg BID -Participating in counseling - now on Suboxone  2-0.5 mg. Had been using kratom previously, last dose about 1 month ago.  Hypothyroidism: -Medications: Levothyroxine  increased to 50 mcg at LOV but she is now out of her medication and has not been taking for the last few months -Last TSH: 5.16 4/25  History of Genital Herpes: -Uses Valtrex  500 mg as needed -Last flare in 2019, no current symptoms    Health Maintenance: -Blood work UTD -Colon cancer screening due  -Pap UTD -Mammogram 10/25 Birads-1 -Discussed obtaining the shingles vaccine at follow up  Outpatient Encounter Medications as of 08/08/2024  Medication Sig   amitriptyline  (ELAVIL ) 100 MG tablet Take 100 mg by mouth at bedtime.   amphetamine -dextroamphetamine  (ADDERALL) 20 MG tablet Take 20-40 mg by mouth 2 (two) times daily.   buprenorphine -naloxone  (SUBOXONE ) 2-0.5 mg SUBL SL tablet Place 1 tablet under the tongue daily.   clindamycin  (CLEOCIN  T) 1 % SWAB Apply 1 Application topically 2 (two) times daily.   ferrous sulfate  220 (44 Fe) MG/5ML solution Take 220 mg by mouth every other day.   Iron , Ferrous Sulfate , 325 (65 Fe) MG TABS Take 325 mg by mouth daily.   lamoTRIgine  (LAMICTAL ) 150 MG tablet Take 150 mg by mouth daily.   levothyroxine  (SYNTHROID ) 50 MCG tablet Take 1 tablet (50 mcg total) by mouth daily.   sertraline  (ZOLOFT ) 100 MG tablet Take 1 tablet (100 mg total) by mouth daily.   valACYclovir  (VALTREX ) 500 MG tablet Take 1 tablet (500 mg total) by mouth daily as needed.   amitriptyline  (ELAVIL ) 50 MG tablet Take 100 mg by mouth at bedtime. (Patient not taking: Reported on 08/08/2024)   brexpiprazole (REXULTI) 1 MG TABS tablet Take 1 mg by mouth daily. (Patient not taking: Reported on 08/08/2024)   lamoTRIgine  (LAMICTAL ) 100 MG tablet Take 100 mg by mouth daily. (Patient not taking: Reported on 08/08/2024)   No facility-administered encounter medications on file as of 08/08/2024.    Past Medical  History:  Diagnosis Date   ADHD (attention deficit hyperactivity disorder)    Anxiety    Depression    Herpes     Past Surgical History:  Procedure Laterality Date   TONSILLECTOMY     XI ROBOTIC LAPAROSCOPIC ASSISTED APPENDECTOMY N/A 12/10/2022   Procedure: XI ROBOTIC LAPAROSCOPIC ASSISTED APPENDECTOMY;  Surgeon: Rodolph Romano, MD;  Location: [facility] ORS;  Service: General;  Laterality: N/A;     Family History  Problem Relation Age of Onset   Anxiety disorder Mother    Depression Mother    Hypertension Mother    Anxiety disorder Father    Depression Father    Hypertension Father    Depression Maternal Grandmother    Schizophrenia Paternal Grandmother    Depression Paternal Grandmother    Hearing loss Paternal Grandmother    Hypertension Paternal Grandmother    Breast cancer Neg Hx     Social History   Socioeconomic History   Marital status: Married    Spouse name: michael   Number of children: 3   Years of education: Not on file   Highest education level: Associate degree: occupational, Scientist, product/process development, or vocational program  Occupational History   Not on file  Tobacco Use   Smoking status: Former    Current packs/day: 0.00    Types: Cigarettes    Quit date: 10/08/2006    Years since quitting: 17.8   Smokeless tobacco: Never  Vaping Use   Vaping status: Never Used  Substance and Sexual Activity   Alcohol  use: Not Currently    Alcohol /week: 5.0 standard drinks of alcohol     Types: 4 Glasses of wine, 1 Cans of beer per week   Drug use: No   Sexual activity: Yes    Birth control/protection: None  Other Topics Concern   Not on file  Social History Narrative   Not on file   Social Drivers of Health   Financial Resource Strain: Low Risk  (11/12/2022)   Overall Financial Resource Strain (CARDIA)    Difficulty of Paying Living Expenses: Not hard at all  Food Insecurity: No Food Insecurity (12/10/2022)   Hunger Vital Sign    Worried About Running Out of Food in the Last Year: Never true    Ran Out of Food in the Last Year: Never true  Transportation Needs: No Transportation Needs (12/10/2022)   PRAPARE - Administrator, Civil Service (Medical): No    Lack of Transportation (Non-Medical): No  Physical Activity: Sufficiently Active (11/12/2022)   Exercise Vital Sign    Days of Exercise per Week: 6 days    Minutes of Exercise per Session: 30 min   Stress: Not on file (09/01/2023)  Social Connections: Socially Integrated (11/12/2022)   Social Connection and Isolation Panel    Frequency of Communication with Friends and Family: More than three times a week    Frequency of Social Gatherings with Friends and Family: More than three times a week    Attends Religious Services: More than 4 times per year    Active Member of [employer] or Organizations: Yes    Attends Banker Meetings: More than 4 times per year    Marital Status: Married  Catering manager Violence: Not At Risk (12/10/2022)   Humiliation, Afraid, Rape, and Kick questionnaire    Fear of Current or Ex-Partner: No    Emotionally Abused: No    Physically Abused: No    Sexually Abused: No    Review of Systems  All  other systems reviewed and are negative.       Objective    BP 120/82 (Cuff Size: Large)   Pulse 97   Temp 98.6 F (37 C) (Oral)   Resp 16   Ht 5' 10 (1.778 m)   Wt 168 lb 11.2 oz (76.5 kg)   LMP 06/19/2024   SpO2 99%   BMI 24.21 kg/m   Physical Exam Constitutional:      Appearance: Normal appearance.  HENT:     Head: Normocephalic and atraumatic.  Eyes:     Conjunctiva/sclera: Conjunctivae normal.  Cardiovascular:     Rate and Rhythm: Normal rate and regular rhythm.  Pulmonary:     Effort: Pulmonary effort is normal.     Breath sounds: Normal breath sounds.  Skin:    General: Skin is warm and dry.  Neurological:     General: No focal deficit present.     Mental Status: She is alert. Mental status is at baseline.  Psychiatric:        Mood and Affect: Mood normal.        Behavior: Behavior normal.         Assessment & Plan:   Assessment & Plan  Hypothyroidism TSH previously elevated at 5.1. Noncompliance with medication noted. Discussed symptoms of hypo- and hyperthyroidism for monitoring. - Prescribe 50 mcg thyroid  medication for six months. - Order TSH lab test in 6-8 weeks. - Instruct to take medication in the  morning before eating or other medications. - Advise to report symptoms of low or high thyroid  function.  Hx of Shingles with postherpetic neuralgia Occasional itching and nerve pain at previous shingles site. Discussed recurrence risk and potential nerve damage. Recommended shingles vaccine to reduce future risk. - Recommend shingles vaccine (Shingrix) with two doses 30-60 days apart, to be obtained at pharmacy. - Provide printed information about the shingles vaccine.  Herpes simplex virus infection (recurrent) No outbreaks since 2019. Continues Valtrex  as needed. Discussed daily prophylaxis for frequent outbreaks, not needed currently. - Refill Valtrex  prescription for as-needed use.  - levothyroxine  (SYNTHROID ) 50 MCG tablet; Take 1 tablet (50 mcg total) by mouth daily.  Dispense: 90 tablet; Refill: 1 - TSH - valACYclovir  (VALTREX ) 500 MG tablet; Take 1 tablet (500 mg total) by mouth daily as needed.  Dispense: 30 tablet; Refill: 0   Return in about 6 months (around 02/06/2025).   Sharyle Fischer, DO

## 2024-08-08 NOTE — Patient Instructions (Signed)
 Recombinant Zoster (Shingles) Vaccine: What You Need to Know Many vaccine information statements are available in Spanish and other languages. See PromoAge.com.br. 1. Why get vaccinated? Recombinant zoster (shingles) vaccine can prevent shingles. Shingles (also called herpes zoster, or just zoster) is a painful skin rash, usually with blisters. In addition to the rash, shingles can cause fever, headache, chills, or upset stomach. Rarely, shingles can lead to complications such as pneumonia, hearing problems, blindness, brain inflammation (encephalitis), or death. The risk of shingles increases with age. The most common complication of shingles is long-term nerve pain called postherpetic neuralgia (PHN). PHN occurs in the areas where the shingles rash was and can last for months or years after the rash goes away. The pain from PHN can be severe and debilitating. The risk of PHN increases with age. An older adult with shingles is more likely to develop PHN and have longer lasting and more severe pain than a younger person. People with weakened immune systems also have a higher risk of getting shingles and complications from the disease. Shingles is caused by varicella-zoster virus, the same virus that causes chickenpox. After you have chickenpox, the virus stays in your body and can cause shingles later in life. Shingles cannot be passed from one person to another, but the virus that causes shingles can spread and cause chickenpox in someone who has never had chickenpox or has never received chickenpox vaccine. 2. Recombinant shingles vaccine Recombinant shingles vaccine provides strong protection against shingles. By preventing shingles, recombinant shingles vaccine also protects against PHN and other complications. Recombinant shingles vaccine is recommended for: Adults 50 years and older Adults 19 years and older who have a weakened immune system because of disease or treatments Shingles vaccine  is given as a two-dose series. For most people, the second dose should be given 2 to 6 months after the first dose. Some people who have or will have a weakened immune system can get the second dose 1 to 2 months after the first dose. Ask your health care provider for guidance. People who have had shingles in the past and people who have received varicella (chickenpox) vaccine are recommended to get recombinant shingles vaccine. The vaccine is also recommended for people who have already gotten another type of shingles vaccine, the live shingles vaccine. There is no live virus in recombinant shingles vaccine. Shingles vaccine may be given at the same time as other vaccines. 3. Talk with your health care provider Tell your vaccination provider if the person getting the vaccine: Has had an allergic reaction after a previous dose of recombinant shingles vaccine, or has any severe, life-threatening allergies Is currently experiencing an episode of shingles Is pregnant In some cases, your health care provider may decide to postpone shingles vaccination until a future visit. People with minor illnesses, such as a cold, may be vaccinated. People who are moderately or severely ill should usually wait until they recover before getting recombinant shingles vaccine. Your health care provider can give you more information. 4. Risks of a vaccine reaction A sore arm with mild or moderate pain is very common after recombinant shingles vaccine. Redness and swelling can also happen at the site of the injection. Tiredness, muscle pain, headache, shivering, fever, stomach pain, and nausea are common after recombinant shingles vaccine. These side effects may temporarily prevent a vaccinated person from doing regular activities. Symptoms usually go away on their own in 2 to 3 days. You should still get the second dose of recombinant shingles vaccine  even if you had one of these reactions after the first  dose. Guillain-Barr syndrome (GBS), a serious nervous system disorder, has been reported very rarely after recombinant zoster vaccine. People sometimes faint after medical procedures, including vaccination. Tell your provider if you feel dizzy or have vision changes or ringing in the ears. As with any medicine, there is a very remote chance of a vaccine causing a severe allergic reaction, other serious injury, or death. 5. What if there is a serious problem? An allergic reaction could occur after the vaccinated person leaves the clinic. If you see signs of a severe allergic reaction (hives, swelling of the face and throat, difficulty breathing, a fast heartbeat, dizziness, or weakness), call 9-1-1 and get the person to the nearest hospital. For other signs that concern you, call your health care provider. Adverse reactions should be reported to the Vaccine Adverse Event Reporting System (VAERS). Your health care provider will usually file this report, or you can do it yourself. Visit the VAERS website at www.vaers.LAgents.no or call (424)557-8947. VAERS is only for reporting reactions, and VAERS staff members do not give medical advice. 6. How can I learn more? Ask your health care provider. Call your local or state health department. Visit the website of the Food and Drug Administration (FDA) for vaccine package inserts and additional information at GoldCloset.com.ee. Contact the Centers for Disease Control and Prevention (CDC): Call 3203662500 (1-800-CDC-INFO) or Visit CDC's website at PicCapture.uy. Source: CDC Vaccine Information Statement Recombinant Zoster Vaccine (11/29/2020) This same material is available at FootballExhibition.com.br for no charge. This information is not intended to replace advice given to you by your health care provider. Make sure you discuss any questions you have with your health care provider. Document Revised: 01/27/2023 Document Reviewed:  10/28/2022 Elsevier Patient Education  2024 [employer].

## 2024-09-18 ENCOUNTER — Encounter: Payer: Self-pay | Admitting: Physician Assistant

## 2024-09-18 ENCOUNTER — Ambulatory Visit: Admitting: Physician Assistant

## 2024-09-18 VITALS — BP 126/79 | HR 93

## 2024-09-18 DIAGNOSIS — D229 Melanocytic nevi, unspecified: Secondary | ICD-10-CM

## 2024-09-18 DIAGNOSIS — L814 Other melanin hyperpigmentation: Secondary | ICD-10-CM

## 2024-09-18 DIAGNOSIS — Z1283 Encounter for screening for malignant neoplasm of skin: Secondary | ICD-10-CM

## 2024-09-18 DIAGNOSIS — L570 Actinic keratosis: Secondary | ICD-10-CM | POA: Diagnosis not present

## 2024-09-18 DIAGNOSIS — Z85828 Personal history of other malignant neoplasm of skin: Secondary | ICD-10-CM

## 2024-09-18 DIAGNOSIS — L578 Other skin changes due to chronic exposure to nonionizing radiation: Secondary | ICD-10-CM

## 2024-09-18 DIAGNOSIS — L821 Other seborrheic keratosis: Secondary | ICD-10-CM | POA: Diagnosis not present

## 2024-09-18 DIAGNOSIS — D489 Neoplasm of uncertain behavior, unspecified: Secondary | ICD-10-CM

## 2024-09-18 DIAGNOSIS — L119 Acantholytic disorder, unspecified: Secondary | ICD-10-CM | POA: Diagnosis not present

## 2024-09-18 DIAGNOSIS — D1801 Hemangioma of skin and subcutaneous tissue: Secondary | ICD-10-CM

## 2024-09-18 DIAGNOSIS — W908XXA Exposure to other nonionizing radiation, initial encounter: Secondary | ICD-10-CM

## 2024-09-18 NOTE — Patient Instructions (Signed)

## 2024-09-18 NOTE — Progress Notes (Signed)
 New Patient Visit   Subjective  Tracey Lester is a 53 y.o. female NEW PATIENT who presents for the following:  Total Body Skin Exam (TBSE).   Past history of BCC on left inner nasal root ~ 2021 and underwent Mohs at [facility].   Prior patient at [facility].    The following portions of the chart were reviewed this encounter and updated as appropriate: medications, allergies, medical history  Review of Systems:  No other skin or systemic complaints except as noted in HPI or Assessment and Plan.  Objective  Well appearing patient in no apparent distress; mood and affect are within normal limits.  A full examination was performed including scalp, head, eyes, ears, nose, lips, neck, chest, axillae, abdomen, back, buttocks, bilateral upper extremities, bilateral lower extremities, hands, feet, fingers, toes, fingernails, and toenails. All findings within normal limits unless otherwise noted below.     Relevant exam findings are noted in the Assessment and Plan.  Left Upper Cutaneous Lip 0.6 cm pink scaly macule   Right Breast 1.1 pink scaly plaque    Assessment & Plan   LENTIGINES, SEBORRHEIC KERATOSES, HEMANGIOMAS - Benign normal skin lesions - Benign-appearing - Call for any changes  MELANOCYTIC NEVI - Tan-brown and/or pink-flesh-colored symmetric macules and papules - Benign appearing on exam today - Observation - Call clinic for new or changing moles - Recommend daily use of broad spectrum spf 30+ sunscreen to sun-exposed areas.   ACTINIC DAMAGE - Chronic condition, secondary to cumulative UV/sun exposure - diffuse scaly erythematous macules with underlying dyspigmentation - Recommend daily broad spectrum sunscreen SPF 30+ to sun-exposed areas, reapply every 2 hours as needed.  - Staying in the shade or wearing long sleeves, sun glasses (UVA+UVB protection) and wide brim hats (4-inch brim around the entire circumference of the hat) are also  recommended for sun protection.  - Call for new or changing lesions.  SKIN CANCER SCREENING PERFORMED TODAY  HISTORY OF BASAL CELL CARCINOMA OF THE SKIN - No evidence of recurrence today - Recommend regular full body skin exams - Recommend daily broad spectrum sunscreen SPF 30+ to sun-exposed areas, reapply every 2 hours as needed.  - Call if any new or changing lesions are noted between office visits  NEOPLASM OF UNCERTAIN BEHAVIOR (2) Left Upper Cutaneous Lip Skin / nail biopsy Type of biopsy: tangential   Informed consent: discussed and consent obtained   Timeout: patient name, date of birth, surgical site, and procedure verified   Procedure prep:  Patient was prepped and draped in usual sterile fashion Prep type:  Isopropyl alcohol  Anesthesia: the lesion was anesthetized in a standard fashion   Anesthetic:  1% lidocaine  w/ epinephrine 1-100,000 buffered w/ 8.4% NaHCO3 Instrument used: flexible razor blade   Hemostasis achieved with: pressure, aluminum chloride and electrodesiccation   Outcome: patient tolerated procedure well   Post-procedure details: sterile dressing applied and wound care instructions given   Dressing type: bandage and petrolatum    Specimen 1 - Surgical pathology Differential Diagnosis: 0.6 cm SCC VS AK  Check Margins: No Right Breast Skin / nail biopsy Type of biopsy: tangential   Informed consent: discussed and consent obtained   Timeout: patient name, date of birth, surgical site, and procedure verified   Procedure prep:  Patient was prepped and draped in usual sterile fashion Prep type:  Isopropyl alcohol  Anesthesia: the lesion was anesthetized in a standard fashion   Anesthetic:  1% lidocaine  w/ epinephrine 1-100,000 buffered w/ 8.4% NaHCO3 Instrument  used: flexible razor blade   Hemostasis achieved with: pressure, aluminum chloride and electrodesiccation   Outcome: patient tolerated procedure well   Post-procedure details: sterile dressing  applied and wound care instructions given   Dressing type: bandage and petrolatum    Specimen B - Surgical pathology Differential Diagnosis: 1.1 cm SCC VS AK  Check Margins: No HISTORY OF BASAL CELL CARCINOMA   LENTIGINES   SEBORRHEIC KERATOSIS   CHERRY ANGIOMA   MULTIPLE BENIGN NEVI   ACTINIC SKIN DAMAGE   SCREENING EXAM FOR SKIN CANCER    Return in about 1 year (around 09/18/2025) for TBSE follow up have sign for release of records (path records only please) .  I, Doyce Pan, CMA, am acting as scribe for Daisy Lites K, PA-C.   Documentation: I have reviewed the above documentation for accuracy and completeness, and I agree with the above.  Morio Widen K, PA-C

## 2024-09-19 LAB — SURGICAL PATHOLOGY

## 2024-09-24 ENCOUNTER — Ambulatory Visit: Payer: Self-pay | Admitting: Physician Assistant

## 2024-09-28 ENCOUNTER — Encounter: Payer: Self-pay | Admitting: Gastroenterology

## 2024-10-02 ENCOUNTER — Encounter: Payer: Self-pay | Admitting: Gastroenterology

## 2024-10-02 ENCOUNTER — Ambulatory Visit: Payer: Self-pay | Admitting: Anesthesiology

## 2024-10-02 ENCOUNTER — Encounter: Admission: RE | Disposition: A | Payer: Self-pay | Source: Home / Self Care | Attending: Gastroenterology

## 2024-10-02 ENCOUNTER — Other Ambulatory Visit: Payer: Self-pay

## 2024-10-02 ENCOUNTER — Ambulatory Visit
Admission: RE | Admit: 2024-10-02 | Discharge: 2024-10-02 | Disposition: A | Attending: Gastroenterology | Admitting: Gastroenterology

## 2024-10-02 DIAGNOSIS — K635 Polyp of colon: Secondary | ICD-10-CM

## 2024-10-02 DIAGNOSIS — K5730 Diverticulosis of large intestine without perforation or abscess without bleeding: Secondary | ICD-10-CM | POA: Insufficient documentation

## 2024-10-02 DIAGNOSIS — K648 Other hemorrhoids: Secondary | ICD-10-CM | POA: Insufficient documentation

## 2024-10-02 DIAGNOSIS — K602 Anal fissure, unspecified: Secondary | ICD-10-CM | POA: Insufficient documentation

## 2024-10-02 DIAGNOSIS — Z1211 Encounter for screening for malignant neoplasm of colon: Secondary | ICD-10-CM

## 2024-10-02 DIAGNOSIS — Z87891 Personal history of nicotine dependence: Secondary | ICD-10-CM | POA: Diagnosis not present

## 2024-10-02 DIAGNOSIS — D125 Benign neoplasm of sigmoid colon: Secondary | ICD-10-CM | POA: Diagnosis not present

## 2024-10-02 HISTORY — PX: COLONOSCOPY: SHX5424

## 2024-10-02 HISTORY — PX: POLYPECTOMY: SHX149

## 2024-10-02 HISTORY — DX: Hypothyroidism, unspecified: E03.9

## 2024-10-02 SURGERY — COLONOSCOPY
Anesthesia: General | Site: Rectum

## 2024-10-02 MED ORDER — PROPOFOL 10 MG/ML IV BOLUS
INTRAVENOUS | Status: AC
  Filled 2024-10-02: qty 40

## 2024-10-02 MED ORDER — SODIUM CHLORIDE 0.9 % IV SOLN: INTRAVENOUS | Status: DC

## 2024-10-02 MED ORDER — LIDOCAINE HCL (PF) 2 % IJ SOLN
INTRAMUSCULAR | Status: AC
  Filled 2024-10-02: qty 5

## 2024-10-02 MED ORDER — LIDOCAINE HCL (CARDIAC) PF 100 MG/5ML IV SOSY
PREFILLED_SYRINGE | INTRAVENOUS | Status: DC | PRN
  Administered 2024-10-02: 50 mg via INTRAVENOUS

## 2024-10-02 MED ORDER — PROPOFOL 10 MG/ML IV BOLUS
INTRAVENOUS | Status: DC | PRN
  Administered 2024-10-02 (×2): 50 mg via INTRAVENOUS
  Administered 2024-10-02: 100 mg via INTRAVENOUS
  Administered 2024-10-02: 50 mg via INTRAVENOUS
  Administered 2024-10-02: 40 mg via INTRAVENOUS
  Administered 2024-10-02: 50 mg via INTRAVENOUS
  Administered 2024-10-02: 40 mg via INTRAVENOUS
  Administered 2024-10-02: 50 mg via INTRAVENOUS
  Administered 2024-10-02: 40 mg via INTRAVENOUS
  Administered 2024-10-02 (×2): 50 mg via INTRAVENOUS
  Administered 2024-10-02 (×2): 40 mg via INTRAVENOUS
  Administered 2024-10-02: 50 mg via INTRAVENOUS

## 2024-10-02 MED ORDER — LACTATED RINGERS IV SOLN: INTRAVENOUS | Status: DC

## 2024-10-02 MED ORDER — PROPOFOL 1000 MG/100ML IV EMUL
INTRAVENOUS | Status: AC
  Filled 2024-10-02: qty 100

## 2024-10-02 MED ORDER — STERILE WATER FOR IRRIGATION IR SOLN
Status: DC | PRN
  Administered 2024-10-02: 1

## 2024-10-02 SURGICAL SUPPLY — 10 items
GOWN CVR UNV OPN BCK APRN NK (MISCELLANEOUS) ×2 IMPLANT
INJECTABLE ELEVIEW COMP 10 (MISCELLANEOUS) IMPLANT
KIT PROCEDURE OLYMPUS (MISCELLANEOUS) ×1 IMPLANT
MANIFOLD NEPTUNE II (INSTRUMENTS) ×1 IMPLANT
NDL CARR LOCKE SCLERO (NEEDLE) IMPLANT
NEEDLE CARR LOCKE SCLERO (NEEDLE) IMPLANT
SNARE COLD EXACTO (MISCELLANEOUS) IMPLANT
SYR CONTROL 10ML LL (SYRINGE) IMPLANT
TRAP ETRAP POLY (MISCELLANEOUS) IMPLANT
WATER STERILE IRR 250ML POUR (IV SOLUTION) ×1 IMPLANT

## 2024-10-02 NOTE — Anesthesia Preprocedure Evaluation (Signed)
 Anesthesia Evaluation  Patient identified by MRN, date of birth, ID band Patient awake    Reviewed: Allergy  & Precautions, NPO status , Patient's Chart, lab work & pertinent test results  History of Anesthesia Complications Negative for: history of anesthetic complications  Airway Mallampati: II  TM Distance: >3 FB Neck ROM: full    Dental  (+) Teeth Intact, Dental Advidsory Given   Pulmonary neg pulmonary ROS, Patient abstained from smoking., former smoker   Pulmonary exam normal breath sounds clear to auscultation       Cardiovascular Exercise Tolerance: Good negative cardio ROS Normal cardiovascular exam Rhythm:Regular     Neuro/Psych  PSYCHIATRIC DISORDERS Anxiety Depression    negative neurological ROS     GI/Hepatic negative GI ROS, Neg liver ROS,,,  Endo/Other  neg diabetesHypothyroidism    Renal/GU negative Renal ROS     Musculoskeletal   Abdominal Normal abdominal exam  (+)   Peds negative pediatric ROS (+)  Hematology negative hematology ROS (+)   Anesthesia Other Findings Past Medical History: No date: ADHD (attention deficit hyperactivity disorder) No date: Anxiety No date: Depression No date: Herpes  Past Surgical History: No date: TONSILLECTOMY  BMI    Body Mass Index: 24.04 kg/m      Reproductive/Obstetrics negative OB ROS                              Anesthesia Physical Anesthesia Plan  ASA: 2  Anesthesia Plan: General   Post-op Pain Management:    Induction: Intravenous  PONV Risk Score and Plan: 1 and Propofol  infusion, TIVA and Treatment may vary due to age or medical condition  Airway Management Planned: Natural Airway and Nasal Cannula  Additional Equipment:   Intra-op Plan:   Post-operative Plan:   Informed Consent: I have reviewed the patients History and Physical, chart, labs and discussed the procedure including the risks, benefits and  alternatives for the proposed anesthesia with the patient or authorized representative who has indicated his/her understanding and acceptance.     Dental Advisory Given  Plan Discussed with: CRNA and Surgeon  Anesthesia Plan Comments:         Anesthesia Quick Evaluation

## 2024-10-02 NOTE — Op Note (Signed)
 [facility] Patient Name: Tracey Lester Procedure Date: 10/02/2024 9:35 AM MRN: [id number] Account #: [id number] Date of Birth: Jul 12, 1971 Admit Type: Outpatient Age: 53 Room: [facility] OR ROOM 01 Gender: Female Note Status: Finalized Instrument Name: Colonoscope [id number] Procedure:             Colonoscopy Indications:           Screening for colorectal malignant neoplasm Providers:             Clotilda Schaffer, MD Referring MD:          Rogelia Copping MD, MD (Referring MD), Sharyle Fischer                         (Referring MD) Complications:         No immediate complications. Estimated blood loss: None. Procedure:             Pre-Anesthesia Assessment:                        - Prior to the procedure, a History and Physical was                         performed, and patient medications and allergies were                         reviewed. The patient's tolerance of previous                         anesthesia was also reviewed. The risks and benefits                         of the procedure and the sedation options and risks                         were discussed with the patient. All questions were                         answered, and informed consent was obtained. Prior                         Anticoagulants: The patient has taken no anticoagulant                         or antiplatelet agents. ASA Grade Assessment: II - A                         patient with mild systemic disease. After reviewing                         the risks and benefits, the patient was deemed in                         satisfactory condition to undergo the procedure.                        After obtaining informed consent, the colonoscope was                         passed under  direct vision. Throughout the procedure,                         the patient's blood pressure, pulse, and oxygen                         saturations were monitored continuously. The                          Colonoscope was introduced through the anus and                         advanced to the the cecum, identified by appendiceal                         orifice and ileocecal valve. The ileocecal valve,                         appendiceal orifice, and rectum were photographed. The                         quality of the bowel preparation was good. Findings:      Four sessile polyps were found in the sigmoid colon and ascending colon.       The polyps were 4 to 12 mm in size. These polyps were removed with a       cold snare. Thelargest polyp, in the ascending colon, was also lifted       with 1mL Eleview. Resection was complete, and retrieval was complete.      Multiple medium-mouthed diverticula were found in the sigmoid colon and       descending colon.      Internal hemorrhoids were found. The hemorrhoids were small.      An anal fissure was found on perianal exam. Impression:            - Four 4 to 12 mm polyps in the sigmoid colon and in                         the ascending colon, removed with a cold snare.                         Resected and retrieved.                        - Diverticulosis in the sigmoid colon and in the                         descending colon.                        - Internal hemorrhoids.                        - Anal fissure found on perianal exam. Recommendation:        - Repeat colonoscopy in 3 - 5 years for surveillance                         based on pathology results.                        -  High fiber diet.                        - Wipe gently and use a heatng pad for 15 minutes                         fourt times daily for two weeks to romote healing of a                         small fissure. Clotilda Schaffer, MD 10/02/2024 10:36:14 AM Number of Addenda: 0 Note Initiated On: 10/02/2024 9:35 AM Scope Withdrawal Time: 0 hours 24 minutes 0 seconds  Total Procedure Duration: 0 hours 30 minutes 2 seconds  Estimated Blood Loss:  Estimated blood loss:  none.      [facility]

## 2024-10-02 NOTE — Transfer of Care (Signed)
 Immediate Anesthesia Transfer of Care Note  Patient: Tracey Lester  Procedure(s) Performed: COLONOSCOPY WITH BIOPSY (Rectum) POLYPECTOMY, INTESTINE (Rectum)  Patient Location: PACU  Anesthesia Type: General  Level of Consciousness: awake, alert  and patient cooperative  Airway and Oxygen Therapy: Patient Spontanous Breathing and Patient connected to supplemental oxygen  Post-op Assessment: Post-op Vital signs reviewed, Patient's Cardiovascular Status Stable, Respiratory Function Stable, Patent Airway and No signs of Nausea or vomiting  Post-op Vital Signs: Reviewed and stable  Complications: No notable events documented.

## 2024-10-02 NOTE — Anesthesia Postprocedure Evaluation (Signed)
 Anesthesia Post Note  Patient: Nyesha Cliff  Procedure(s) Performed: COLONOSCOPY WITH BIOPSY (Rectum) POLYPECTOMY, INTESTINE (Rectum)  Patient location during evaluation: PACU Anesthesia Type: General Level of consciousness: awake and alert Pain management: pain level controlled Vital Signs Assessment: post-procedure vital signs reviewed and stable Respiratory status: spontaneous breathing, nonlabored ventilation, respiratory function stable and patient connected to nasal cannula oxygen Cardiovascular status: blood pressure returned to baseline and stable Postop Assessment: no apparent nausea or vomiting Anesthetic complications: no   No notable events documented.   Last Vitals:  Vitals:   10/02/24 1045 10/02/24 1050  BP: (!) 103/90 109/77  Pulse: 75 87  Resp: 20 14  Temp:  (!) 36.4 C  SpO2: 99% 99%    Last Pain:  Vitals:   10/02/24 1050  TempSrc:   PainSc: 0-No pain                 Prentice Murphy

## 2024-10-02 NOTE — H&P (Signed)
  10/02/24   HPI: Pt is a 53 yo F presenting for screening colonoscopy. Chart reviewed. Last colonoscopy: None. Fhx CRC: None. Blood thinners: None.   PE: HEENT: EOMI CV: RRR, no m/r/g Neck: Trachea midline Lungs: CTAB Abd: Soft, NT, NABS Ext: No edema.   Assessment: The procedure's risks and benefits were described to the patient and they wish to proceed   Clotilda Schaffer, MD [facility]

## 2024-10-03 DIAGNOSIS — K635 Polyp of colon: Secondary | ICD-10-CM

## 2024-10-03 DIAGNOSIS — Z1211 Encounter for screening for malignant neoplasm of colon: Secondary | ICD-10-CM

## 2024-10-04 LAB — SURGICAL PATHOLOGY

## 2024-10-06 ENCOUNTER — Ambulatory Visit: Payer: Self-pay | Admitting: Gastroenterology

## 2024-12-19 ENCOUNTER — Ambulatory Visit: Admitting: Physician Assistant

## 2025-02-06 ENCOUNTER — Ambulatory Visit: Admitting: Internal Medicine

## 2025-09-18 ENCOUNTER — Ambulatory Visit: Admitting: Physician Assistant
